# Patient Record
Sex: Male | Born: 1985 | Race: White | Hispanic: No | Marital: Single | Smoking: Current every day smoker
Health system: Southern US, Community
[De-identification: ages and names within clinical notes are randomized; demographics above are authoritative.]

## PROBLEM LIST (undated history)

## (undated) DIAGNOSIS — F419 Anxiety disorder, unspecified: Secondary | ICD-10-CM

## (undated) DIAGNOSIS — F329 Major depressive disorder, single episode, unspecified: Secondary | ICD-10-CM

## (undated) DIAGNOSIS — F32A Depression, unspecified: Secondary | ICD-10-CM

## (undated) DIAGNOSIS — G5600 Carpal tunnel syndrome, unspecified upper limb: Secondary | ICD-10-CM

## (undated) DIAGNOSIS — G8929 Other chronic pain: Secondary | ICD-10-CM

## (undated) DIAGNOSIS — G4700 Insomnia, unspecified: Secondary | ICD-10-CM

## (undated) DIAGNOSIS — M549 Dorsalgia, unspecified: Secondary | ICD-10-CM

## (undated) DIAGNOSIS — F4310 Post-traumatic stress disorder, unspecified: Secondary | ICD-10-CM

## (undated) DIAGNOSIS — T1491XA Suicide attempt, initial encounter: Secondary | ICD-10-CM

## (undated) DIAGNOSIS — F1910 Other psychoactive substance abuse, uncomplicated: Secondary | ICD-10-CM

## (undated) DIAGNOSIS — M25569 Pain in unspecified knee: Secondary | ICD-10-CM

---
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
## 2003-03-25 ENCOUNTER — Emergency Department (HOSPITAL_COMMUNITY): Admission: EM | Admit: 2003-03-25 | Discharge: 2003-03-25 | Payer: Self-pay | Admitting: Emergency Medicine

## 2004-05-31 ENCOUNTER — Emergency Department (HOSPITAL_COMMUNITY): Admission: EM | Admit: 2004-05-31 | Discharge: 2004-05-31 | Payer: Self-pay | Admitting: *Deleted

## 2004-08-31 ENCOUNTER — Ambulatory Visit: Payer: Self-pay | Admitting: Psychiatry

## 2004-08-31 ENCOUNTER — Inpatient Hospital Stay (HOSPITAL_COMMUNITY): Admission: RE | Admit: 2004-08-31 | Discharge: 2004-09-04 | Payer: Self-pay | Admitting: Psychiatry

## 2004-08-31 ENCOUNTER — Emergency Department (HOSPITAL_COMMUNITY): Admission: EM | Admit: 2004-08-31 | Discharge: 2004-08-31 | Payer: Self-pay | Admitting: Emergency Medicine

## 2006-10-28 ENCOUNTER — Emergency Department: Payer: Self-pay | Admitting: Emergency Medicine

## 2008-03-12 ENCOUNTER — Emergency Department (HOSPITAL_COMMUNITY): Admission: EM | Admit: 2008-03-12 | Discharge: 2008-03-13 | Payer: Self-pay | Admitting: Emergency Medicine

## 2008-06-25 ENCOUNTER — Emergency Department (HOSPITAL_COMMUNITY): Admission: EM | Admit: 2008-06-25 | Discharge: 2008-06-25 | Payer: Self-pay | Admitting: Emergency Medicine

## 2010-12-25 NOTE — Discharge Summary (Signed)
Ryan Kelley, SUMLER                  ACCOUNT NO.:  [id number]   MEDICAL RECORD NO.:  [id number]          PATIENT TYPE:  IPS   LOCATION:  0505                          FACILITY:  [facility]   PHYSICIAN:  Geoffery Lyons, M.D.      DATE OF BIRTH:  02/27/1986   DATE OF ADMISSION:  08/31/2004  DATE OF DISCHARGE:  09/04/2004                                 DISCHARGE SUMMARY   CHIEF COMPLAINT AND PRESENTING ILLNESS:  This was the first admission to  [facility]  for this 25 year old white male, single,  voluntarily admitted.  He presented in the emergency room complaining of  suicidal ideas since his brother died of an opiate overdose about 3 weeks  prior to this admission, endorsed depressed mood.   PAST PSYCHIATRIC HISTORY:  First time [facility], second inpatient,  last time when he was 14, after accidental overdose on Xanax.  Has been  treated with Lexapro and Zoloft.   ALCOHOL AND DRUG HISTORY:  Denies the use or abuse of any substances.   PAST MEDICAL HISTORY:  Cellulitis right arm.   MEDICATIONS:  Klonopin 2-3 mg per day, methadone 20-50 mg.   PHYSICAL EXAMINATION:  Performed, failed to show any acute findings.   LABORATORY WORKUP:  CBC within normal limits.  Blood chemistries were within  normal limits, glucose 109.  TSH 5.992, repeated to 2.133.  CBC:  Hemoglobin  14.5.  Drug screen positive for cocaine and benzodiazepines and marijuana.   MENTAL STATUS EXAM:  Reveals an alert, cooperative male, constricted affect,  some psychomotor retardation.  Speech soft tone, slow production.  Mood  depressed, affect constricted.  Thought processes logical, coherent and  relevant.  Endorsed suicide ideations, no plan, could contract for safety.  No homicidal ideas, no hallucinations, no delusions.  Cognition well  preserved.   ADMISSION DIAGNOSES:   AXIS I:  1.  Major depression, recurrent.  2.  Polysubstance abuse.   AXIS II:  No diagnosis.   AXIS III:  Cellulitis  right arm.   AXIS IV:  Moderate.   AXIS V:  Global assessment of function upon admission 20, highest global  assessment of function in past year 65.   COURSE IN HOSPITAL:  He was admitted and started on individual and group  psychotherapy.  He was given trazodone for sleep.  He was detoxed using  Clonidine, was given some Librium 25 every 6 hours as needed.  Also Seroquel  25 every 6 hours as needed, and he was placed on Symmetrel 100 twice a day.  He was treated for his cellulitis with Augmentin 875 mg every 8 hours which  was switched to Augmentin 875 every 12 hours x10 days.  He was able to  settle down and start opening up.  There was a family session with the  mother.  Mother did not feel that he was honest as he had called sister-in-  law to sneak in some pills.  He was minimizing his substance use.  His  mother overall did not feel that he was ready to leave the hospital.  He  said that he was doing well.  He was wanting to explore the possibility of a  halfway house, would go to AA/NA.  Endorsed that he needed to grieve the  death of his brother and he was willing to pursue further outpatient  treatment.  We worked at a relapse prevention plan, and on January 27 he was  in full contact with reality.  Endorsing no suicidal ideas, no homicidal  ideas, no hallucinations, no delusions.  He was insightful, committed to  abstinence, working on long-term recovery.   DISCHARGE DIAGNOSES:   AXIS I:  1.  Depressive disorder not otherwise specified.  2.  Polysubstance abuse.   AXIS II:  No diagnosis.   AXIS III:  Cellulitis.   AXIS IV:  Moderate.   AXIS V:  Global assessment of function upon discharge 50.   DISCHARGE MEDICATIONS:  1.  Symmetrel 100 twice a day.  2.  Augmentin 875 every 12 hours x7 days.  3.  Wellbutrin XL 150 mg daily.  4.  Trazodone 50 at night as needed for sleep.   DISPOSITION:  Follow up [facility]      IL/MEDQ  D:   10/06/2004  T:  10/06/2004  Job:  [id number]

## 2011-02-17 ENCOUNTER — Encounter: Payer: Self-pay | Admitting: *Deleted

## 2011-02-17 ENCOUNTER — Emergency Department (HOSPITAL_COMMUNITY)
Admission: EM | Admit: 2011-02-17 | Discharge: 2011-02-17 | Discharge status: Against Medical Advice | Payer: Self-pay | Attending: Emergency Medicine | Admitting: Emergency Medicine

## 2011-02-17 DIAGNOSIS — F1920 Other psychoactive substance dependence, uncomplicated: Secondary | ICD-10-CM | POA: Insufficient documentation

## 2011-02-17 DIAGNOSIS — E119 Type 2 diabetes mellitus without complications: Secondary | ICD-10-CM | POA: Insufficient documentation

## 2011-02-17 DIAGNOSIS — F1910 Other psychoactive substance abuse, uncomplicated: Secondary | ICD-10-CM

## 2011-02-17 HISTORY — DX: Post-traumatic stress disorder, unspecified: F43.10

## 2011-02-17 HISTORY — DX: Other psychoactive substance abuse, uncomplicated: F19.10

## 2011-02-17 LAB — BASIC METABOLIC PANEL
BUN: 12 mg/dL (ref 6–23)
CO2: 27 mEq/L (ref 19–32)
Chloride: 101 mEq/L (ref 96–112)
GFR calc Af Amer: >60 mL/min (ref >60)
Potassium: 4.5 mEq/L (ref 3.5–5.1)

## 2011-02-17 LAB — CBC
Hemoglobin: 16.6 g/dL (ref 13.0–17.0)
MCHC: 35.5 g/dL (ref 30.0–36.0)
RBC: 5.24 MIL/uL (ref 4.22–5.81)
WBC: 11.3 10*3/uL — ABNORMAL HIGH (ref 4.0–10.5)

## 2011-02-17 LAB — DIFFERENTIAL
Basophils Absolute: 0 10*3/uL (ref 0.0–0.1)
Basophils Relative: 0 % (ref 0–1)
Lymphocytes Relative: 33 % (ref 12–46)
Monocytes Relative: 7 % (ref 3–12)
Neutro Abs: 6.5 10*3/uL (ref 1.7–7.7)
Neutrophils Relative %: 58 % (ref 43–77)

## 2011-02-17 LAB — RAPID URINE DRUG SCREEN, HOSP PERFORMED
Amphetamines: NOT DETECTED
Benzodiazepines: POSITIVE — AB
Cocaine: NOT DETECTED

## 2011-02-17 LAB — ETHANOL: Alcohol, Ethyl (B): 33 mg/dL — ABNORMAL HIGH (ref 0–11)

## 2011-02-17 MED ORDER — LORAZEPAM 1 MG PO TABS: 2.0000 mg | ORAL_TABLET | Freq: Once | ORAL | Status: DC

## 2011-02-17 NOTE — ED Notes (Signed)
Pt wants help with his addiction to narcotics. Pt has been taking Percocet, Hydrocodone, Methadone, utracet, Morphine and in large amt. Pt states that he has been addicted since he was 10. Pt denies homicidal or suicidal ideations.

## 2011-02-17 NOTE — ED Provider Notes (Addendum)
History     Chief Complaint  Patient presents with  . Addiction Problem   Patient is a 25 y.o. male presenting with drug problem. The history is provided by the patient. No language interpreter was used.  Drug Problem This is a chronic problem. Episode onset: 14 years ago. The problem occurs constantly. The problem has not changed since onset.Pertinent negatives include no chest pain, no abdominal pain, no headaches and no shortness of breath. The symptoms are aggravated by nothing. Relieved by: suboxone. Treatments tried: Suboxone. The treatment provided moderate relief.  C/o narcotic addiction and states he is currently going through withdrawal. Patient expresses interest in rehab program for narcotic addiction. Patient reports he has been taking Percocet, Hydrocodone, Methadone, Oxycodone. Morphine in large amounts. States he last used narcotics 2 days ago. C/o current myalgias, chills, diarrhea. States symptoms only relieved when taking Suboxone. Patient reports he has been addicted to narcotics since he was 25 yo. H/o diabetes and PTSD.  Past Medical History  Diagnosis Date  . Substance abuse   . Diabetes mellitus   . PTSD (post-traumatic stress disorder)     History reviewed. No pertinent past surgical history.  Family History  Problem Relation Age of Onset  . Diabetes Mother   . Cancer Mother   . Hypertension Mother   . Hypertension Father     History  Substance Use Topics  . Smoking status: Current Everyday Smoker -- 1.0 packs/day for 7 years    Types: Cigarettes  . Smokeless tobacco: Not on file  . Alcohol Use: 6.9 oz/week    1 Glasses of wine, 6 Cans of beer, 2 Shots of liquor, 3 Drinks containing 0.5 oz of alcohol per week      Review of Systems  Constitutional: Positive for chills.  Respiratory: Negative for shortness of breath.   Cardiovascular: Negative for chest pain.  Gastrointestinal: Positive for diarrhea. Negative for nausea, vomiting and abdominal pain.   Musculoskeletal: Positive for myalgias.  Neurological: Negative for headaches.  Psychiatric/Behavioral: Negative for suicidal ideas.       Negative Homicidal Ideations. Positive drug abuse.   All other systems reviewed and are negative.   Physical Exam  BP 124/67  Pulse 78  Temp(Src) 98.8 F (37.1 C) (Oral)  Resp 20  Ht 5\' 5"  (1.651 m)  Wt 200 lb (90.719 kg)  BMI 33.28 kg/m2  SpO2 99%  Physical Exam  Nursing note and vitals reviewed. Constitutional: He is oriented to person, place, and time. Vital signs are normal. He appears well-developed and well-nourished.  HENT:  Head: Normocephalic and atraumatic.  Eyes: Conjunctivae are normal. Pupils are equal, round, and reactive to light.  Neck: Neck supple.  Cardiovascular: Normal rate, regular rhythm, normal heart sounds and normal pulses.   Pulmonary/Chest: Effort normal and breath sounds normal.  Abdominal: Soft. Bowel sounds are normal. He exhibits no distension.  Musculoskeletal: Normal range of motion. He exhibits no edema.  Neurological: He is alert and oriented to person, place, and time. No sensory deficit.  Skin: Skin is warm and dry.  Psychiatric: He has a normal mood and affect. His behavior is normal.    ED Course  Procedures   BHS consult I personally performed the services described in this documentation, which was scribed in my presence. The recorded information has been reviewed and considered.   Donnetta Hutching, MD 02/17/11 1450  Donnetta Hutching, MD 02/17/11 1451  Donnetta Hutching, MD 02/17/11 1451

## 2011-02-17 NOTE — ED Notes (Signed)
Tommy, ACT team member, here to assess.  Patient lying on stretcher talking on cell phone.

## 2011-02-17 NOTE — ED Notes (Signed)
No change in status. NAD.  Awaiting ACT member to evaluate.

## 2011-03-08 ENCOUNTER — Emergency Department (HOSPITAL_COMMUNITY)
Admission: EM | Admit: 2011-03-08 | Discharge: 2011-03-08 | Disposition: A | Discharge status: Rehabilitation Facility | Payer: Self-pay | Attending: Emergency Medicine | Admitting: Emergency Medicine

## 2011-03-08 ENCOUNTER — Encounter (HOSPITAL_COMMUNITY): Payer: Self-pay | Admitting: Emergency Medicine

## 2011-03-08 DIAGNOSIS — F1110 Opioid abuse, uncomplicated: Secondary | ICD-10-CM

## 2011-03-08 DIAGNOSIS — J3489 Other specified disorders of nose and nasal sinuses: Secondary | ICD-10-CM | POA: Insufficient documentation

## 2011-03-08 DIAGNOSIS — IMO0001 Reserved for inherently not codable concepts without codable children: Secondary | ICD-10-CM | POA: Insufficient documentation

## 2011-03-08 DIAGNOSIS — R197 Diarrhea, unspecified: Secondary | ICD-10-CM | POA: Insufficient documentation

## 2011-03-08 DIAGNOSIS — R6883 Chills (without fever): Secondary | ICD-10-CM | POA: Insufficient documentation

## 2011-03-08 DIAGNOSIS — F17200 Nicotine dependence, unspecified, uncomplicated: Secondary | ICD-10-CM | POA: Insufficient documentation

## 2011-03-08 DIAGNOSIS — F1120 Opioid dependence, uncomplicated: Secondary | ICD-10-CM | POA: Insufficient documentation

## 2011-03-08 LAB — COMPREHENSIVE METABOLIC PANEL
Albumin: 4.3 g/dL (ref 3.5–5.2)
BUN: 11 mg/dL (ref 6–23)
Calcium: 10.1 mg/dL (ref 8.4–10.5)
Creatinine, Ser: 0.93 mg/dL (ref 0.50–1.35)
Total Protein: 8.1 g/dL (ref 6.0–8.3)

## 2011-03-08 LAB — RAPID URINE DRUG SCREEN, HOSP PERFORMED
Amphetamines: NOT DETECTED
Benzodiazepines: POSITIVE — AB
Cocaine: NOT DETECTED
Opiates: NOT DETECTED

## 2011-03-08 LAB — CBC
HCT: 45.7 % (ref 39.0–52.0)
MCHC: 35.2 g/dL (ref 30.0–36.0)
MCV: 90.9 fL (ref 78.0–100.0)
RDW: 12.8 % (ref 11.5–15.5)

## 2011-03-08 LAB — ETHANOL: Alcohol, Ethyl (B): <11 mg/dL (ref 0–11)

## 2011-03-08 MED ORDER — CLONIDINE HCL 0.1 MG PO TABS
0.1000 mg | ORAL_TABLET | Freq: Once | ORAL | Status: AC
  Administered 2011-03-08: 0.1 mg via ORAL
  Filled 2011-03-08: qty 1

## 2011-03-08 NOTE — ED Provider Notes (Addendum)
History   Written by Enos Fling acting as scribe for Felisa Bonier, MD.   Chief Complaint  Patient presents with  . Addiction Problem   The history is provided by the patient.  25 yo male presents requesting medical clearance and referral to an inpatient detox program. Pt reports he abuses po narcotics and also uses marijuana; reports last use 2 days ago and is c/o withdrawal sx of body aches, chills, runny nose, and diarrhea. Pt states he has been in rehab previously but not for narcotics. Also reports he has an rx for diazepam and does not abuse this med. Denies cp, sob, ha, abd pain, or n/v.   Past Medical History  Diagnosis Date  . Substance abuse   . Diabetes mellitus   . PTSD (post-traumatic stress disorder)     History reviewed. No pertinent past surgical history.  Family History  Problem Relation Age of Onset  . Diabetes Mother   . Cancer Mother   . Hypertension Mother   . Hypertension Father     History  Substance Use Topics  . Smoking status: Current Everyday Smoker -- 1.0 packs/day for 7 years    Types: Cigarettes  . Smokeless tobacco: Not on file  . Alcohol Use: 0.0 oz/week     occasional      Review of Systems  Constitutional: Positive for chills. Negative for fever.  HENT: Positive for rhinorrhea. Negative for congestion.   Respiratory: Negative for shortness of breath.   Cardiovascular: Negative for chest pain.  Gastrointestinal: Positive for diarrhea. Negative for nausea and vomiting.  Musculoskeletal: Positive for myalgias.  Skin: Negative for pallor.  Neurological: Negative for dizziness.  Psychiatric/Behavioral: Negative for suicidal ideas, hallucinations and self-injury.  All other systems reviewed and are negative.    Physical Exam  BP 122/66  Pulse 72  Temp(Src) 99 F (37.2 C) (Oral)  Resp 20  Ht 5\' 5"  (1.651 m)  Wt 200 lb (90.719 kg)  BMI 33.28 kg/m2  SpO2 98%  Physical Exam  Nursing note and vitals  reviewed. Constitutional: He is oriented to person, place, and time. He appears well-developed and well-nourished. No distress.  HENT:  Head: Normocephalic and atraumatic.  Eyes: EOM are normal.       pupils equal but dilated 5 mm  Neck: Normal range of motion. Neck supple. No JVD present.  Cardiovascular: Normal rate, regular rhythm and normal heart sounds.   Pulmonary/Chest: Effort normal.  Abdominal: Soft. Bowel sounds are normal. There is no tenderness.  Musculoskeletal: He exhibits no edema and no tenderness.       No swelling or deformity of extremities   Neurological: He is alert and oriented to person, place, and time.  Skin: Skin is warm and dry. No rash noted. No erythema.  Psychiatric: He has a normal mood and affect.    ED Course  Procedures  OTHER DATA REVIEWED: Nursing notes, vital signs, and past medical records reviewed.   LABS / RADIOLOGY: Results for orders placed during the hospital encounter of 03/08/11  CBC      Component Value Range   WBC 9.0  4.0 - 10.5 (K/uL)   RBC 5.03  4.22 - 5.81 (MIL/uL)   Hemoglobin 16.1  13.0 - 17.0 (g/dL)   HCT 78.4  69.6 - 29.5 (%)   MCV 90.9  78.0 - 100.0 (fL)   MCH 32.0  26.0 - 34.0 (pg)   MCHC 35.2  30.0 - 36.0 (g/dL)   RDW 28.4  13.2 -  15.5 (%)   Platelets 278  150 - 400 (K/uL)  COMPREHENSIVE METABOLIC PANEL      Component Value Range   Sodium 139  135 - 145 (mEq/L)   Potassium 4.1  3.5 - 5.1 (mEq/L)   Chloride 103  96 - 112 (mEq/L)   CO2 27  19 - 32 (mEq/L)   Glucose, Bld 106 (*) 70 - 99 (mg/dL)   BUN 11  6 - 23 (mg/dL)   Creatinine, Ser 1.61  0.50 - 1.35 (mg/dL)   Calcium 09.6  8.4 - 10.5 (mg/dL)   Total Protein 8.1  6.0 - 8.3 (g/dL)   Albumin 4.3  3.5 - 5.2 (g/dL)   AST 23  0 - 37 (U/L)   ALT 34  0 - 53 (U/L)   Alkaline Phosphatase 79  39 - 117 (U/L)   Total Bilirubin 0.3  0.3 - 1.2 (mg/dL)   GFR calc non Af Amer >60  >60 (mL/min)   GFR calc Af Amer >60  >60 (mL/min)  ETHANOL      Component Value Range    Alcohol, Ethyl (B) <11  0 - 11 (mg/dL)  URINE RAPID DRUG SCREEN (HOSP PERFORMED)      Component Value Range   Opiates NONE DETECTED  NONE DETECTED    Cocaine NONE DETECTED  NONE DETECTED    Benzodiazepines POSITIVE (*) NONE DETECTED    Amphetamines NONE DETECTED  NONE DETECTED    Tetrahydrocannabinol POSITIVE (*) NONE DETECTED    Barbiturates NONE DETECTED  NONE DETECTED    Radiology: No results found.   ED COURSE / COORDINATION OF CARE:   MDM: The patient appears to have dependence issue with prescription narcotics, and otherwise is of no threat to himself or others. He is awake alert and oriented appropriately, behaving normally. At this time he has been accepted to [facility] substance abuse treatment facility and program, and I will discharge him to go directly to [facility] for substance abuse treatment. Patient states his understanding of and agreement with this plan of care.     Felisa Bonier, MD 03/08/11 2017  I personally performed the services described in this documentation, which was scribed in my presence. The recorded information has been reviewed and considered.   Felisa Bonier, MD 06/18/11 1440

## 2011-03-08 NOTE — ED Notes (Signed)
Ryan Kelley, ACT team, in to speak with patient.

## 2011-03-08 NOTE — ED Notes (Signed)
Pt here for medical clearance to get help with addition. Pt states he is addicted to po narcotics, last use 03/06/11. Pt states he also smokes marijuana. Denies si/hi.

## 2011-05-07 LAB — POCT I-STAT, CHEM 8
Glucose, Bld: 320 — ABNORMAL HIGH
HCT: 46
Hemoglobin: 15.6
Potassium: 4.2
Sodium: 135

## 2011-05-07 LAB — RAPID URINE DRUG SCREEN, HOSP PERFORMED
Amphetamines: NOT DETECTED
Barbiturates: NOT DETECTED
Benzodiazepines: POSITIVE — AB

## 2012-10-17 ENCOUNTER — Encounter (HOSPITAL_COMMUNITY): Payer: Self-pay | Admitting: *Deleted

## 2012-10-17 ENCOUNTER — Emergency Department (HOSPITAL_COMMUNITY)
Admission: EM | Admit: 2012-10-17 | Discharge: 2012-10-18 | Disposition: A | Discharge status: Other Acute Inpatient Hospital | Payer: Self-pay | Attending: Emergency Medicine | Admitting: Emergency Medicine

## 2012-10-17 DIAGNOSIS — F3289 Other specified depressive episodes: Secondary | ICD-10-CM | POA: Insufficient documentation

## 2012-10-17 DIAGNOSIS — R197 Diarrhea, unspecified: Secondary | ICD-10-CM | POA: Insufficient documentation

## 2012-10-17 DIAGNOSIS — R45851 Suicidal ideations: Secondary | ICD-10-CM

## 2012-10-17 DIAGNOSIS — F32A Depression, unspecified: Secondary | ICD-10-CM

## 2012-10-17 DIAGNOSIS — F1210 Cannabis abuse, uncomplicated: Secondary | ICD-10-CM | POA: Insufficient documentation

## 2012-10-17 DIAGNOSIS — Z8659 Personal history of other mental and behavioral disorders: Secondary | ICD-10-CM | POA: Insufficient documentation

## 2012-10-17 DIAGNOSIS — E119 Type 2 diabetes mellitus without complications: Secondary | ICD-10-CM | POA: Insufficient documentation

## 2012-10-17 DIAGNOSIS — R110 Nausea: Secondary | ICD-10-CM | POA: Insufficient documentation

## 2012-10-17 DIAGNOSIS — F17200 Nicotine dependence, unspecified, uncomplicated: Secondary | ICD-10-CM | POA: Insufficient documentation

## 2012-10-17 HISTORY — DX: Suicide attempt, initial encounter: T14.91XA

## 2012-10-17 LAB — CBC WITH DIFFERENTIAL/PLATELET
Basophils Absolute: 0 10*3/uL (ref 0.0–0.1)
Basophils Relative: 0 % (ref 0–1)
Hemoglobin: 16.2 g/dL (ref 13.0–17.0)
MCHC: 35.1 g/dL (ref 30.0–36.0)
Monocytes Relative: 4 % (ref 3–12)
Neutro Abs: 7 10*3/uL (ref 1.7–7.7)
Neutrophils Relative %: 67 % (ref 43–77)

## 2012-10-17 LAB — COMPREHENSIVE METABOLIC PANEL
ALT: 21 U/L (ref 0–53)
AST: 18 U/L (ref 0–37)
Albumin: 4.5 g/dL (ref 3.5–5.2)
Alkaline Phosphatase: 71 U/L (ref 39–117)
Chloride: 101 mEq/L (ref 96–112)
Potassium: 4.1 mEq/L (ref 3.5–5.1)
Sodium: 140 mEq/L (ref 135–145)
Total Bilirubin: 0.3 mg/dL (ref 0.3–1.2)

## 2012-10-17 LAB — RAPID URINE DRUG SCREEN, HOSP PERFORMED
Amphetamines: NOT DETECTED
Tetrahydrocannabinol: POSITIVE — AB

## 2012-10-17 MED ORDER — ONDANSETRON HCL 4 MG PO TABS: 4.0000 mg | ORAL_TABLET | Freq: Three times a day (TID) | ORAL | Status: DC | PRN

## 2012-10-17 MED ORDER — LORAZEPAM 1 MG PO TABS
1.0000 mg | ORAL_TABLET | Freq: Three times a day (TID) | ORAL | Status: DC | PRN
  Administered 2012-10-17: 1 mg via ORAL
  Filled 2012-10-17: qty 1

## 2012-10-17 MED ORDER — IBUPROFEN 400 MG PO TABS
600.0000 mg | ORAL_TABLET | Freq: Three times a day (TID) | ORAL | Status: DC | PRN
  Administered 2012-10-17: 600 mg via ORAL
  Filled 2012-10-17: qty 2

## 2012-10-17 MED ORDER — NICOTINE 21 MG/24HR TD PT24
21.0000 mg | MEDICATED_PATCH | Freq: Every day | TRANSDERMAL | Status: DC
  Administered 2012-10-17: 21 mg via TRANSDERMAL
  Filled 2012-10-17: qty 1

## 2012-10-17 MED ORDER — ACETAMINOPHEN 325 MG PO TABS: 650.0000 mg | ORAL_TABLET | ORAL | Status: DC | PRN

## 2012-10-17 MED ORDER — ALUM & MAG HYDROXIDE-SIMETH 200-200-20 MG/5ML PO SUSP: 30.0000 mL | ORAL | Status: DC | PRN

## 2012-10-17 MED ORDER — ZOLPIDEM TARTRATE 5 MG PO TABS
10.0000 mg | ORAL_TABLET | Freq: Every evening | ORAL | Status: DC | PRN
  Administered 2012-10-17: 10 mg via ORAL
  Filled 2012-10-17: qty 2

## 2012-10-17 NOTE — ED Provider Notes (Signed)
History  This chart was scribed for Ward Givens, MD by Bennett Scrape, ED Scribe. This patient was seen in room APA17/APA17 and the patient's care was started at 1:04 PM.  CSN: [id number]  Arrival date & time 10/17/12  1228   First MD Initiated Contact with Patient 10/17/12 1304      Chief Complaint  Patient presents with  . V70.1     The history is provided by the patient. A language interpreter was used.    Ryan Kelley is a 27 y.o. male who presents to the Emergency Department complaining of one month of constant SI with one SI attempt of taking a "small handful" of Advil  4 days ago. Mother reports that the pt called her today and stated that he was feeling suicidal and need to "get some help" which is why she brought him in for evaluation today. Pt denies having emesis, melena and dizziness afterwards but reports periumbilical abdominal pain, nausea and diarrhea. He reports prior SI attempts after overdosing on Xanax and was hospitalized when 27 yo. He reports that he was being followed up by Dr. Tiburcio Pea with [facility] but stopped going over one year ago when she left the practice because "she was the best doctor there". He denies seeing a therapist and denies being on any psychiatric medications currently. He reports that he used to be on Valium and cymbala but has not been medicated since he quit seeing Dr. Tiburcio Pea over one year ago.  He reports that he drinks a 6 pack one a week and smokes marijuana. He denies any other illegal drug use.   PCP Dr Ouida Sills  Past Medical History  Diagnosis Date  . Substance abuse   . PTSD (post-traumatic stress disorder)   . Attempted suicide   . Diabetes mellitus     History reviewed. No pertinent past surgical history.  Family History  Problem Relation Age of Onset  . Diabetes Mother   . Cancer Mother   . Hypertension Mother   . Hypertension Father     History  Substance Use Topics  . Smoking status: Current Every Day Smoker -- 1.00  packs/day for 7 years    Types: Cigarettes  . Smokeless tobacco: Not on file  . Alcohol Use: 0.0 oz/week     Comment: once a week, occasional use  Lives with significant other Unemployed     Review of Systems  Gastrointestinal: Positive for nausea, abdominal pain and diarrhea. Negative for vomiting.  Neurological: Negative for dizziness.  Psychiatric/Behavioral: Positive for suicidal ideas.  All other systems reviewed and are negative.    Allergies  Review of patient's allergies indicates no known allergies.  Home Medications   Current Outpatient Rx  Name  Route  Sig  Dispense  Refill  . naproxen sodium (ANAPROX) 220 MG tablet   Oral   Take 440 mg by mouth 2 (two) times daily with a meal.           Triage Vitals: BP 135/72  Pulse 106  Temp(Src) 98.1 F (36.7 C) (Oral)  Resp 18  Ht 5\' 5"  (1.651 m)  Wt 195 lb (88.451 kg)  BMI 32.45 kg/m2  SpO2 98%  Vital signs normal    Physical Exam  Nursing note and vitals reviewed. Constitutional: He is oriented to person, place, and time. He appears well-developed and well-nourished.  Non-toxic appearance. He does not appear ill. No distress.  HENT:  Head: Normocephalic and atraumatic.  Right Ear: External ear normal.  Left Ear: External ear normal.  Nose: Nose normal. No mucosal edema or rhinorrhea.  Mouth/Throat: Oropharynx is clear and moist and mucous membranes are normal. No dental abscesses or edematous.  Eyes: Conjunctivae and EOM are normal. Pupils are equal, round, and reactive to light.  Neck: Normal range of motion and full passive range of motion without pain. Neck supple.  Cardiovascular: Normal rate, regular rhythm and normal heart sounds.  Exam reveals no gallop and no friction rub.   No murmur heard. Pulmonary/Chest: Effort normal and breath sounds normal. No respiratory distress. He has no wheezes. He has no rhonchi. He has no rales. He exhibits no tenderness and no crepitus.  Abdominal: Soft. Normal  appearance and bowel sounds are normal. He exhibits no distension. There is no tenderness. There is no rebound and no guarding.  Musculoskeletal: Normal range of motion. He exhibits no edema and no tenderness.  Moves all extremities well.   Neurological: He is alert and oriented to person, place, and time. He has normal strength. No cranial nerve deficit.  Skin: Skin is warm, dry and intact. No rash noted. No erythema. No pallor.  Psychiatric: His speech is normal and behavior is normal. His mood appears not anxious.  Affect flat    ED Course  Procedures (including critical care time)  DIAGNOSTIC STUDIES: Oxygen Saturation is 98% on room air, normal by my interpretation.    COORDINATION OF CARE: 1:35 PM-Discussed treatment plan which includes CBC panel, UA and ACT evaluation with pt at bedside and pt agreed to plan.   Tommy here in ED and has evaluated patient.   Results for orders placed during the hospital encounter of 10/17/12  CBC WITH DIFFERENTIAL      Result Value Range   WBC 10.4  4.0 - 10.5 K/uL   RBC 5.16  4.22 - 5.81 MIL/uL   Hemoglobin 16.2  13.0 - 17.0 g/dL   HCT 63.8  75.6 - 43.3 %   MCV 89.3  78.0 - 100.0 fL   MCH 31.4  26.0 - 34.0 pg   MCHC 35.1  30.0 - 36.0 g/dL   RDW 29.5  18.8 - 41.6 %   Platelets 286  150 - 400 K/uL   Neutrophils Relative 67  43 - 77 %   Neutro Abs 7.0  1.7 - 7.7 K/uL   Lymphocytes Relative 27  12 - 46 %   Lymphs Abs 2.8  0.7 - 4.0 K/uL   Monocytes Relative 4  3 - 12 %   Monocytes Absolute 0.5  0.1 - 1.0 K/uL   Eosinophils Relative 1  0 - 5 %   Eosinophils Absolute 0.1  0.0 - 0.7 K/uL   Basophils Relative 0  0 - 1 %   Basophils Absolute 0.0  0.0 - 0.1 K/uL  COMPREHENSIVE METABOLIC PANEL      Result Value Range   Sodium 140  135 - 145 mEq/L   Potassium 4.1  3.5 - 5.1 mEq/L   Chloride 101  96 - 112 mEq/L   CO2 28  19 - 32 mEq/L   Glucose, Bld 105 (*) 70 - 99 mg/dL   BUN 17  6 - 23 mg/dL   Creatinine, Ser 6.06  0.50 - 1.35 mg/dL    Calcium 9.8  8.4 - 30.1 mg/dL   Total Protein 7.3  6.0 - 8.3 g/dL   Albumin 4.5  3.5 - 5.2 g/dL   AST 18  0 - 37 U/L   ALT 21  0 - 53 U/L   Alkaline Phosphatase 71  39 - 117 U/L   Total Bilirubin 0.3  0.3 - 1.2 mg/dL   GFR calc non Af Amer >90  >90 mL/min   GFR calc Af Amer >90  >90 mL/min  ETHANOL      Result Value Range   Alcohol, Ethyl (B) <11  0 - 11 mg/dL   Laboratory interpretation all normal     1. Depression   2. Suicidal ideation    Plan inpatient psychiatric admission   Devoria Albe, MD, FACEP   MDM   I personally performed the services described in this documentation, which was scribed in my presence. The recorded information has been reviewed and considered.  Devoria Albe, MD, Armando Gang       Ward Givens, MD 10/17/12 [phone]

## 2012-10-17 NOTE — ED Notes (Signed)
Advised no more phone calls for the night.  Will be in to check vital signs and he is to go to sleep.

## 2012-10-17 NOTE — BH Assessment (Signed)
Assessment Note   Ryan Kelley is an 27 y.o. male. There patient presented to the ED with complaints of increased depression and current suicidal ideations. He was seen by Dr Carroll Sage until she closed her practice in [place] about  A year ago. He has had no follow up and now has no medication. Four days ago he tried to overdose on a handful of Aleve. This only made him sick at his stomach. He continues to have suicidal thoughts with plan to overdose again. He is not homicidal; and has no history of violencehe denies . He is not psychotic. He lives with his girlfriend and is unemployed. He denies substance abuse. He does admit to only accessional use of marijuana. He feels worthless, he is crying at times, he has sleep disturbance, and no energy. He cannot contract for safety.   Axis I: Major Depression, Recurrent severe Axis II: Deferred Axis III:  Past Medical History  Diagnosis Date  . Substance abuse   . PTSD (post-traumatic stress disorder)   . Attempted suicide   . Diabetes mellitus    Axis IV: economic problems, occupational problems and problems with access to health care services Axis V: 31-40 impairment in reality testing  Past Medical History:  Past Medical History  Diagnosis Date  . Substance abuse   . PTSD (post-traumatic stress disorder)   . Attempted suicide   . Diabetes mellitus     History reviewed. No pertinent past surgical history.  Family History:  Family History  Problem Relation Age of Onset  . Diabetes Mother   . Cancer Mother   . Hypertension Mother   . Hypertension Father     Social History:  reports that he has been smoking Cigarettes.  He has a 7 pack-year smoking history. He does not have any smokeless tobacco history on file. He reports that  drinks alcohol. He reports that he uses illicit drugs (Marijuana) about 5 times per week.  Additional Social History:     CIWA: CIWA-Ar BP: 115/54 mmHg Pulse Rate: 84 COWS:    Allergies: No Known  Allergies  Home Medications:  (Not in a hospital admission)  OB/GYN Status:  No LMP for male patient.  General Assessment Data Location of Assessment: [facility] ACT Assessment: Yes Living Arrangements: Spouse/significant other Can pt return to current living arrangement?: Yes Admission Status: Voluntary Is patient capable of signing voluntary admission?: Yes Transfer from: Acute Hospital Referral Source: MD  Education Status Is patient currently in school?: No  Risk to self Suicidal Ideation: Yes-Currently Present Suicidal Intent: Yes-Currently Present Is patient at risk for suicide?: Yes Suicidal Plan?: No-Not Currently/Within Last 6 Months Access to Means: No What has been your use of drugs/alcohol within the last 12 months?: denies (only occassional use of marijuana) Previous Attempts/Gestures: Yes (4 days ago) How many times?: 1 Other Self Harm Risks: denies Triggers for Past Attempts: Other (Comment) (ongoing depressed mood) Intentional Self Injurious Behavior: None Family Suicide History: No Recent stressful life event(s): Financial Problems Persecutory voices/beliefs?: No Depression: Yes Depression Symptoms: Insomnia;Tearfulness;Fatigue;Loss of interest in usual pleasures;Feeling worthless/self pity Substance abuse history and/or treatment for substance abuse?: Yes (occassional use of marijuana) Suicide prevention information given to non-admitted patients: Not applicable  Risk to Others Homicidal Ideation: No Thoughts of Harm to Others: No Current Homicidal Intent: No Current Homicidal Plan: No Access to Homicidal Means: No History of harm to others?: No Assessment of Violence: None Noted Does patient have access to weapons?: No Criminal Charges Pending?:  No Does patient have a court date: No  Psychosis Hallucinations: None noted Delusions: None noted  Mental Status Report Appear/Hygiene: Improved Eye Contact: Good Motor Activity: Freedom of  movement Speech: Logical/coherent Level of Consciousness: Alert Mood: Depressed Affect: Blunted Anxiety Level: None Thought Processes: Coherent;Relevant Judgement: Unimpaired Orientation: Person;Place;Time Obsessive Compulsive Thoughts/Behaviors: None  Cognitive Functioning Concentration: Normal Memory: Recent Intact;Remote Intact IQ: Average Insight: Fair Impulse Control: Fair Appetite: Good Weight Loss: 20 (several months) Weight Gain: 0 Sleep: Decreased Total Hours of Sleep: 3 Vegetative Symptoms: None  ADLScreening [facility] Assessment Services) Patient's cognitive ability adequate to safely complete daily activities?: Yes Patient able to express need for assistance with ADLs?: Yes Independently performs ADLs?: Yes (appropriate for developmental age)  Abuse/Neglect [facility]) Physical Abuse: Denies Verbal Abuse: Denies Sexual Abuse: Denies  Prior Inpatient Therapy Prior Inpatient Therapy: No  Prior Outpatient Therapy Prior Outpatient Therapy: Yes Prior Therapy Dates: 2012-2013 Prior Therapy Facilty/Provider(s): dr Steward Drone Harris/ [facility]  (not seen oveer a year, no current medicatiosn) Reason for Treatment: medications  ADL Screening (condition at time of admission) Patient's cognitive ability adequate to safely complete daily activities?: Yes Patient able to express need for assistance with ADLs?: Yes Independently performs ADLs?: Yes (appropriate for developmental age)       Abuse/Neglect Assessment (Assessment to be complete while patient is alone) Physical Abuse: Denies Verbal Abuse: Denies Sexual Abuse: Denies Values / Beliefs Cultural Requests During Hospitalization: None Spiritual Requests During Hospitalization: None        Additional Information 1:1 In Past 12 Months?: No CIRT Risk: No Elopement Risk: No Does patient have medical clearance?: Yes     Disposition: DR KNAPP IS IN AGREEMENT WITH REFERRING PATIENT TO INPATIENT AT [facility]  [facility] Disposition Initial Assessment Completed: Yes Disposition of Patient: Inpatient treatment program Type of inpatient treatment program: Adult  On Site Evaluation by:   Reviewed with Physician:     Jearld Pies 10/17/2012 3:12 PM

## 2012-10-17 NOTE — ED Notes (Signed)
Pt states with suicidal thoughts, attempted last week by taking large amount of aleve, denies seeing things or hearing voices, denies plan at this time

## 2012-10-18 ENCOUNTER — Encounter (HOSPITAL_COMMUNITY): Payer: Self-pay | Admitting: *Deleted

## 2012-10-18 ENCOUNTER — Inpatient Hospital Stay (HOSPITAL_COMMUNITY)
Admission: AD | Admit: 2012-10-18 | Discharge: 2012-10-21 | DRG: 897 | Disposition: A | Discharge status: Home / Self Care | Payer: Federal, State, Local not specified - Other | Source: Ambulatory Visit | Attending: Psychiatry | Admitting: Psychiatry

## 2012-10-18 ENCOUNTER — Encounter (HOSPITAL_COMMUNITY): Payer: Self-pay

## 2012-10-18 DIAGNOSIS — F4310 Post-traumatic stress disorder, unspecified: Secondary | ICD-10-CM

## 2012-10-18 DIAGNOSIS — F1120 Opioid dependence, uncomplicated: Secondary | ICD-10-CM

## 2012-10-18 DIAGNOSIS — F19939 Other psychoactive substance use, unspecified with withdrawal, unspecified: Principal | ICD-10-CM | POA: Diagnosis present

## 2012-10-18 DIAGNOSIS — Z79899 Other long term (current) drug therapy: Secondary | ICD-10-CM

## 2012-10-18 DIAGNOSIS — F1110 Opioid abuse, uncomplicated: Secondary | ICD-10-CM | POA: Diagnosis present

## 2012-10-18 DIAGNOSIS — F1193 Opioid use, unspecified with withdrawal: Secondary | ICD-10-CM

## 2012-10-18 MED ORDER — ALUM & MAG HYDROXIDE-SIMETH 200-200-20 MG/5ML PO SUSP: 30.0000 mL | ORAL | Status: DC | PRN

## 2012-10-18 MED ORDER — NAPROXEN 500 MG PO TABS
500.0000 mg | ORAL_TABLET | Freq: Two times a day (BID) | ORAL | Status: DC | PRN
  Administered 2012-10-18: 500 mg via ORAL
  Filled 2012-10-18: qty 1

## 2012-10-18 MED ORDER — LOPERAMIDE HCL 2 MG PO CAPS
2.0000 mg | ORAL_CAPSULE | ORAL | Status: DC | PRN
  Administered 2012-10-18: 4 mg via ORAL

## 2012-10-18 MED ORDER — ONDANSETRON 4 MG PO TBDP: 4.0000 mg | ORAL_TABLET | Freq: Four times a day (QID) | ORAL | Status: DC | PRN

## 2012-10-18 MED ORDER — ACETAMINOPHEN 325 MG PO TABS
650.0000 mg | ORAL_TABLET | Freq: Four times a day (QID) | ORAL | Status: DC | PRN
Start: 2012-10-18 — End: 2012-10-21

## 2012-10-18 MED ORDER — NICOTINE 21 MG/24HR TD PT24
21.0000 mg | MEDICATED_PATCH | Freq: Every day | TRANSDERMAL | Status: DC
  Administered 2012-10-18: 21 mg via TRANSDERMAL
  Filled 2012-10-18 (×3): qty 1

## 2012-10-18 MED ORDER — TRAZODONE HCL 50 MG PO TABS
50.0000 mg | ORAL_TABLET | Freq: Every evening | ORAL | Status: DC | PRN
  Administered 2012-10-18 – 2012-10-20 (×3): 50 mg via ORAL
  Filled 2012-10-18: qty 1
  Filled 2012-10-18: qty 14
  Filled 2012-10-18 (×2): qty 1

## 2012-10-18 MED ORDER — METHOCARBAMOL 500 MG PO TABS
500.0000 mg | ORAL_TABLET | Freq: Three times a day (TID) | ORAL | Status: DC | PRN
  Administered 2012-10-18 – 2012-10-19 (×2): 500 mg via ORAL
  Filled 2012-10-18 (×2): qty 1

## 2012-10-18 MED ORDER — CLONIDINE HCL 0.1 MG PO TABS
0.1000 mg | ORAL_TABLET | Freq: Four times a day (QID) | ORAL | Status: AC
  Administered 2012-10-18 – 2012-10-19 (×7): 0.1 mg via ORAL
  Filled 2012-10-18 (×7): qty 1

## 2012-10-18 MED ORDER — HYDROXYZINE HCL 25 MG PO TABS: 25.0000 mg | ORAL_TABLET | Freq: Four times a day (QID) | ORAL | Status: DC | PRN

## 2012-10-18 MED ORDER — CLONIDINE HCL 0.1 MG PO TABS
0.1000 mg | ORAL_TABLET | ORAL | Status: DC
  Administered 2012-10-20 – 2012-10-21 (×3): 0.1 mg via ORAL
  Filled 2012-10-18 (×4): qty 1

## 2012-10-18 MED ORDER — DICYCLOMINE HCL 20 MG PO TABS: 20.0000 mg | ORAL_TABLET | Freq: Four times a day (QID) | ORAL | Status: DC | PRN

## 2012-10-18 MED ORDER — MAGNESIUM HYDROXIDE 400 MG/5ML PO SUSP: 30.0000 mL | Freq: Every day | ORAL | Status: DC | PRN

## 2012-10-18 MED ORDER — CLONIDINE HCL 0.1 MG PO TABS
0.1000 mg | ORAL_TABLET | Freq: Every day | ORAL | Status: DC
  Filled 2012-10-18 (×2): qty 1

## 2012-10-18 NOTE — ED Notes (Signed)
Pt w/ snoring resp. Normal rise and fall of chest noted, sitter at bedside.

## 2012-10-18 NOTE — [facility] Suicide Risk Assessment (Signed)
Suicide Risk Assessment  Admission Assessment     Nursing information obtained from:  Patient Demographic factors:  Male;Low socioeconomic status Current Mental Status:  Suicidal ideation indicated by patient;Intention to act on suicide plan Loss Factors:  Decrease in vocational status;Financial problems / change in socioeconomic status Historical Factors:  Family history of mental illness or substance abuse Risk Reduction Factors:  Living with another person, especially a relative;Positive social support  CLINICAL FACTORS:   Panic Attacks Depression:   Comorbid alcohol abuse/dependence Hopelessness Insomnia Alcohol/Substance Abuse/Dependencies  COGNITIVE FEATURES THAT CONTRIBUTE TO RISK: None identified   SUICIDE RISK:   Moderate:  Frequent suicidal ideation with limited intensity, and duration, some specificity in terms of plans, no associated intent, good self-control, limited dysphoria/symptomatology, some risk factors present, and identifiable protective factors, including available and accessible social support.  PLAN OF CARE: Supportive approach/coping skills/relapse prevention                               Clonidine Detox/reassess comorbidites  I certify that inpatient services furnished can reasonably be expected to improve the patient's condition.  Trennon Torbeck A 10/18/2012, 1:43 PM

## 2012-10-18 NOTE — ED Notes (Signed)
[facility] called for transport to [facility]. " Will send a truck shortly", per [place].

## 2012-10-18 NOTE — Progress Notes (Signed)
Patient ID: CORBYN WILDEY, male   DOB: 01-29-1986, 29 y.o.   MRN: [id number] Diamante is a 26y/o s/w/m admitted voluntarily following medical clearance.  Patient presented with chief c/o suicidal ideations and stated in ED that he had taken "a handful" of aleve 4 days ago but that it it only caused him abdominal discomfort.  Presents here with depression and SA issues including an 18 month history of opiate abuse (reported vicodin,percocet, and methadone) that he "got off the streets".  Approximate use of $100 per day. States last use was 4 days ago and the only lingering withdrawal symptom is restless leg movements.  States he is motivated for sobriety.  Reports infrequent etoh use but regular THC use.  Patient is quiet and cooperative. Denies pertinent medical hx. Prvious psych dx of PTSD but has been unable to continue medications due to job loss and subseqent financial difficulties.  Denies current SI and contracts for safety.  Oriented to unit following search. 15' checks and POC initiated.  Remains safe on the unit.

## 2012-10-18 NOTE — ED Notes (Signed)
Report given to Elpidio Anis, Charity fundraiser.

## 2012-10-18 NOTE — Progress Notes (Signed)
Patient ID: Ryan Kelley, male   DOB: 1986-04-06, 27 y.o.   MRN: [id number]  D: Pt informed the writer he's been abusing narcotics for apprx 1.5 yrs. Writer attempted to clarify info given in reference to suicidal attempt. Pt stated he didn't to detox himself because he was "tired of living out of a bottle". Stated he was in pain due to withdrawals and didn't want to use narcotics, so he took aleve. Stated after a while he took more aleve in effort to alleviate the pain.  Stated he took a total of 15 aleve over a time period.   A:  Support and encouragement was offered. 15 min checks continued for safety.  R: Pt remains safe.

## 2012-10-18 NOTE — ED Notes (Signed)
Report called to brittany at [facility].

## 2012-10-18 NOTE — ED Notes (Signed)
Pt awake, ate am meal tray, requested to know how much longer he will be here, advised unsure, Tommy from act to see pt. This am.

## 2012-10-18 NOTE — Tx Team (Signed)
Initial Interdisciplinary Treatment Plan  PATIENT STRENGTHS: (choose at least two) Ability for insight Average or above average intelligence Capable of independent living Communication skills Physical Health Supportive family/friends  PATIENT STRESSORS: Financial difficulties Medication change or noncompliance Occupational concerns Substance abuse   PROBLEM LIST: Problem List/Patient Goals Date to be addressed Date deferred Reason deferred Estimated date of resolution  Opiate abuse 10/18/12           PTSD 10/18/12           S/I plan to overdose 10/18/12           Depression 10/18/12                  DISCHARGE CRITERIA:  Ability to meet basic life and health needs Need for constant or close observation no longer present Reduction of life-threatening or endangering symptoms to within safe limits Safe-care adequate arrangements made Withdrawal symptoms are absent or subacute and managed without 24-hour nursing intervention  PRELIMINARY DISCHARGE PLAN: Attend 12-step recovery group Return to previous living arrangement Return to previous work or school arrangements  PATIENT/FAMIILY INVOLVEMENT: This treatment plan has been presented to and reviewed with the patient, Ryan Kelley, and/or family member.  The patient and family have been given the opportunity to ask questions and make suggestions.  [name] 10/18/2012, 1:12 PM

## 2012-10-18 NOTE — ED Notes (Signed)
Pt has been accepted at [facility].  Awaiting, Tommy from [facility] to complete paperwork.

## 2012-10-18 NOTE — H&P (Addendum)
Psychiatric Admission Assessment Adult  Patient Identification:  Ryan Kelley Date of Evaluation:  10/18/2012 Chief Complaint:  Major Depressive DO  History of Present Illness:: Withdrawing from Percocet, Vicodin, Methadone. Has been using for one and a half year. Broke ribs given Vicodin, made him feel good, started buying on the street. Before th;is episode denies. States he has PTSD from seeing his brother die DEC 2005, 29 Y/O accidental OD of methadone. He was there he was talking to him in another minute he was unresponsive. He tried CPR did nor work (he was 27 Y/O). Still with memories, flashbacks, wakes up in the middle of the night crying. Admits he wakes up with chest tight throat closing on him. Having a full panic attack. Smokes marijuana every day.  Elements:  Location:  in patient. Quality:  unable to fucntion. Severity:  severe. Timing:  every day. Duration:  year and one half. Context:  PTSD with opioid dependence. Associated Signs/Synptoms: Depression Symptoms:  depressed mood, anhedonia, fatigue, feelings of worthlessness/guilt, difficulty concentrating, hopelessness, impaired memory, suicidal thoughts with specific plan, anxiety, panic attacks, loss of energy/fatigue, disturbed sleep, weight gain, (Hypo) Manic Symptoms:  Denies Anxiety Symptoms:  Excessive Worry, Panic Symptoms, Psychotic Symptoms:  Denies PTSD Symptoms: Had a traumatic exposure:  brother died of an OD, in front of him Re-experiencing:  Flashbacks Intrusive Thoughts Nightmares Hypervigilance:  Yes Hyperarousal:  Difficulty Concentrating Emotional Numbness/Detachment Irritability/Anger  Psychiatric Specialty Exam: Physical Exam  Review of Systems  Constitutional: Positive for diaphoresis.  HENT: Negative.   Eyes: Negative.   Respiratory: Positive for cough.        Smokes pack a day 9 years  Cardiovascular: Negative.   Gastrointestinal: Negative.   Genitourinary: Negative.    Musculoskeletal: Positive for myalgias and back pain.  Skin: Negative.   Neurological: Positive for weakness.  Endo/Heme/Allergies: Negative.   Psychiatric/Behavioral: Positive for depression, suicidal ideas and substance abuse. The patient is nervous/anxious.     There were no vitals taken for this visit.There is no weight on file to calculate BMI.  General Appearance: Fairly Groomed  Patent attorney::  Fair  Speech:  Clear and Coherent, Slow and not spontaneous  Volume:  Decreased  Mood:  Anxious and Depressed  Affect:  Restricted  Thought Process:  Coherent and Goal Directed  Orientation:  Full (Time, Place, and Person)  Thought Content:  Rumination and worries, concerns, trying to quit using opioids, grievieng the death of his brother, guilt for living longer than his brother  Suicidal Thoughts:  Yes.  without intent/plan  Homicidal Thoughts:  No  Memory:  Immediate;   Fair Recent;   Fair Remote;   Fair  Judgement:  Fair  Insight:  Present  Psychomotor Activity:  Restlessness  Concentration:  Fair  Recall:  Fair  Akathisia:  No  Handed:  Right  AIMS (if indicated):     Assets:  Desire for Improvement Housing Social Support Transportation Vocational/Educational  Sleep:       Past Psychiatric History: Diagnosis: PTSD, Opioid Depedence  Hospitalizations: Denies  Outpatient Care: [facility])   Substance Abuse Care: Denies  Self-Mutilation: Denies  Suicidal Attempts: Denies  Violent Behaviors: Denies   Past Medical History:   Past Medical History  Diagnosis Date  . Substance abuse   . PTSD (post-traumatic stress disorder)   . Attempted suicide     Allergies:  No Known Allergies PTA Medications: Prescriptions prior to admission  Medication Sig Dispense Refill  . naproxen sodium (ANAPROX) 220 MG  tablet Take 440 mg by mouth 2 (two) times daily with a meal.        Previous Psychotropic Medications:  Medication/Dose  Valium, Celexa                Substance Abuse History in the last 12 months:  yes  Consequences of Substance Abuse: Legal Consequences:  drinking under age Withdrawal Symptoms:   Cramps Diarrhea Nausea Vomiting aches pains  Social History:  reports that he has been smoking Cigarettes.  He has a 7 pack-year smoking history. He does not have any smokeless tobacco history on file. He reports that  drinks alcohol. He reports that he uses illicit drugs (Marijuana) about 5 times per week. Additional Social History:                      Current Place of Residence:  Lives with girlfriend, and her 2 kids Place of Birth:   Family Members: Marital Status:  Single Children:  Sons:  Daughters: Relationships: Education:  Has college Publishing rights manager Problems/Performance: Religious Beliefs/Practices: Not active in church History of Abuse (Emotional/Phsycial/Sexual) Occupational Experiences; Low Voltage pull cables Engineer, technical sales) for [employer] History:  None. Legal History: Felony braking and entering 16 months  Hobbies/Interests:  Family History:   Family History  Problem Relation Age of Onset  . Diabetes Mother   . Cancer Mother   . Hypertension Mother   . Hypertension Father    Father alcohol, Mother, anxiety, depression, Addictions both sides of the family Results for orders placed during the hospital encounter of 10/17/12 (from the past 72 hour(s))  CBC WITH DIFFERENTIAL     Status: None   Collection Time    10/17/12  2:04 PM      Result Value Range   WBC 10.4  4.0 - 10.5 K/uL   RBC 5.16  4.22 - 5.81 MIL/uL   Hemoglobin 16.2  13.0 - 17.0 g/dL   HCT 16.1  09.6 - 04.5 %   MCV 89.3  78.0 - 100.0 fL   MCH 31.4  26.0 - 34.0 pg   MCHC 35.1  30.0 - 36.0 g/dL   RDW 40.9  81.1 - 91.4 %   Platelets 286  150 - 400 K/uL   Neutrophils Relative 67  43 - 77 %   Neutro Abs 7.0  1.7 - 7.7 K/uL   Lymphocytes Relative 27  12 - 46 %   Lymphs Abs 2.8  0.7 - 4.0  K/uL   Monocytes Relative 4  3 - 12 %   Monocytes Absolute 0.5  0.1 - 1.0 K/uL   Eosinophils Relative 1  0 - 5 %   Eosinophils Absolute 0.1  0.0 - 0.7 K/uL   Basophils Relative 0  0 - 1 %   Basophils Absolute 0.0  0.0 - 0.1 K/uL  COMPREHENSIVE METABOLIC PANEL     Status: Abnormal   Collection Time    10/17/12  2:04 PM      Result Value Range   Sodium 140  135 - 145 mEq/L   Potassium 4.1  3.5 - 5.1 mEq/L   Chloride 101  96 - 112 mEq/L   CO2 28  19 - 32 mEq/L   Glucose, Bld 105 (*) 70 - 99 mg/dL   BUN 17  6 - 23 mg/dL   Creatinine, Ser 7.82  0.50 - 1.35 mg/dL   Calcium 9.8  8.4 - 95.6 mg/dL   Total Protein 7.3  6.0 - 8.3 g/dL   Albumin 4.5  3.5 - 5.2 g/dL   AST 18  0 - 37 U/L   ALT 21  0 - 53 U/L   Alkaline Phosphatase 71  39 - 117 U/L   Total Bilirubin 0.3  0.3 - 1.2 mg/dL   GFR calc non Af Amer >90  >90 mL/min   GFR calc Af Amer >90  >90 mL/min   Comment:            The eGFR has been calculated     using the CKD EPI equation.     This calculation has not been     validated in all clinical     situations.     eGFR's persistently     <90 mL/min signify     possible Chronic Kidney Disease.  ETHANOL     Status: None   Collection Time    10/17/12  2:04 PM      Result Value Range   Alcohol, Ethyl (B) <11  0 - 11 mg/dL   Comment:            LOWEST DETECTABLE LIMIT FOR     SERUM ALCOHOL IS 11 mg/dL     FOR MEDICAL PURPOSES ONLY  URINE RAPID DRUG SCREEN (HOSP PERFORMED)     Status: Abnormal   Collection Time    10/17/12  2:20 PM      Result Value Range   Opiates POSITIVE (*) NONE DETECTED   Cocaine NONE DETECTED  NONE DETECTED   Benzodiazepines POSITIVE (*) NONE DETECTED   Amphetamines NONE DETECTED  NONE DETECTED   Tetrahydrocannabinol POSITIVE (*) NONE DETECTED   Barbiturates NONE DETECTED  NONE DETECTED   Comment:            DRUG SCREEN FOR MEDICAL PURPOSES     ONLY.  IF CONFIRMATION IS NEEDED     FOR ANY PURPOSE, NOTIFY LAB     WITHIN 5 DAYS.                 LOWEST DETECTABLE LIMITS     FOR URINE DRUG SCREEN     Drug Class       Cutoff (ng/mL)     Amphetamine      1000     Barbiturate      200     Benzodiazepine   200     Tricyclics       300     Opiates          300     Cocaine          300     THC              50   Psychological Evaluations:  Assessment:   AXIS I:  Post Traumatic Stress Disorder, Major Depression, Opioid Dependence/withdrawal, Marijuana Abuse AXIS II:  Deferred AXIS III:   Past Medical History  Diagnosis Date  . Substance abuse   . PTSD (post-traumatic stress disorder)   . Attempted suicide    AXIS IV:  other psychosocial or environmental problems and problems with primary support group AXIS V:  41-50 serious symptoms  Treatment Plan/Recommendations:  Supportive approach/coping skills/relapse prevention  Detox, reassess co morbidities  Treatment Plan Summary: Daily contact with patient to assess and evaluate symptoms and progress in treatment Medication management Current Medications:  No current facility-administered medications for this encounter.    Observation Level/Precautions:  15 minute checks  Laboratory:  As per the ED  Psychotherapy:  Individual/group  Medications:  Clonidine detox/reassess co morbidities  Consultations:    Discharge Concerns:    Estimated LOS: 5-7 days  Other:     I certify that inpatient services furnished can reasonably be expected to improve the patient's condition.   LUGO,IRVING A 3/12/201412:51 PM

## 2012-10-18 NOTE — Progress Notes (Signed)
Adult Psychoeducational Group Note  Date:  10/18/2012 Time:  8:40 PM  Group Topic/Focus:  AA group  Participation Level:  Active  Participation Quality:  Appropriate  Affect:  Appropriate  Cognitive:  Alert  Insight: Appropriate  Engagement in Group:  Improving  Modes of Intervention:  Discussion  Additional Comments:    Flonnie Hailstone 10/18/2012, 8:40 PM

## 2012-10-18 NOTE — Progress Notes (Signed)
[facility] LCSW Group Therapy  Emotion Regulation  10/18/2012 2:26 PM   Type of Therapy: Group Therapy    Participation Level: Active   Participation Quality: Appropriate and Attentive   Affect: Appropriate   Cognitive: Alert and Appropriate   Insight: Developing/Improving   Engagement in Therapy: Engaged   Modes of Intervention: Discussion, Education, Rapport Building and Support   Summary of Progress/Problems:Todays group topic was Emotion Regulation. Pts participated in activity where they chose two photographs provided by SW Intern, one that represented a positive emotion and one that represented a negative emotion. Pts processed ways to cope with negative emotions and behaviors that they could engage in to evoke their positive emotions.  Pt attended group. His affect was bright and open. Pt chose a photo of a bridge for his positive emotion.  He shared that he identified with this photograph because he can see the light at the end of the tunnel.  He shared that he is taking the steps to get help so there is an emotion of relief and anticipation for more positive things to come.  He chose a photo of a bike that he shared looked broken down and had parts missing as his negative.  He disclosed that he identified with feeling this way as well.  Pt processed that his two photograph choices were connected and that he is looking to get the help he needs following dc.    Pt shared that he would like IOP following dc.  He stated that he plans to return home following dc. He rates depression at five, anxiety at 5, hopelessness and helplessness at three.  Pt denies SI/HI.  He reports that he is experiencing dt symptoms of restlessness and dull pain which he rates at 5/6.

## 2012-10-19 MED ORDER — NICOTINE 14 MG/24HR TD PT24
14.0000 mg | MEDICATED_PATCH | Freq: Every day | TRANSDERMAL | Status: DC
  Administered 2012-10-19 – 2012-10-21 (×3): 14 mg via TRANSDERMAL
  Filled 2012-10-19 (×4): qty 1

## 2012-10-19 NOTE — Progress Notes (Addendum)
D:  Patient's self inventory sheet, patient sleeps well, has good appetite, normal energy level, good attention span.  Rated depression #3, hopelessness #1.  Denied withdrawals.  Denied SI.  Has experienced pain in past  24 hours.  Zero pain goal, worst pain #1.  After discharge, plans to quit smoking, exercise, garden to occupy his free time.  No questions for staff at this time.   Does have discharge plans.  No problems taking meds after discharge. A:  Medications administered per MD order.  Support and encouragement given throughout day. R:  Following treatment plan.  Denied SI and HI.  Denied A/V hallucinations.  Contracts for safety. Did not attend afternoon group.

## 2012-10-19 NOTE — Progress Notes (Signed)
[facility] LCSW Aftercare Discharge Planning Group Note  10/19/2012 11:58 AM  Participation Quality:  Attentive and Appropriate  Affect:  Appropriate  Cognitive:  Alert and Appropriate  Insight:  Improving  Engagement in Group:  Engaged  Modes of Intervention:  Education, Dentist, Scientist, physiological of Progress/Problems: Pt affect was bright and positive. His posture was open and his disclosures were optimistic.  He rates depression at 3 and all other symptoms at zero. Pt plans to return to his girlfriends home following dc.  Pt would like to follow up at [facility] at dc.  Bournes, Roshelle L 10/19/2012, 11:58 AM

## 2012-10-19 NOTE — Progress Notes (Signed)
[facility] LCSW Group Therapy  10/19/2012 3:28 PM  Type of Therapy:  Group Therapy  Participation Level:  Active  Participation Quality:  Appropriate and Attentive  Affect:  Appropriate  Cognitive:  Alert and Appropriate  Insight:  Developing/Improving  Engagement in Therapy:  Engaged   Modes of Intervention: Discussion, Education, Problem-solving, Rapport Building and Support   Summary of Progress/Problems: Pt attended psychoeducation group. Today's theme was "Living a Balanced Life." Pts processed what balance means to them, what ways help up find balance, and what things make [place] lose balance. Pts also took turns discussing things that they would like to let go of and things they would like to hold on to in order to maintain balance in their lives.  Pt shared that he would like to let go of his addiction and get closer to his family to maintain balance.  He processed that he plans to use the time he spent servicing his addiction to strengthen his familial relationships.  Pt is optimistic about his recovery and shared that he intends to stay committed to his treatment. Bournes, Roshelle L 10/19/2012, 3:28 PM

## 2012-10-19 NOTE — Progress Notes (Signed)
Patient resting quietly with eyes closed. Respirations even and unlabored. No distress noted, Q 15 minute check continues as ordered to maintain safety.  

## 2012-10-19 NOTE — Progress Notes (Signed)
Adult Psychoeducational Group Note  Date:  10/19/2012 Time:  7:09 PM  Group Topic/Focus:  Overcoming Stress:   The focus of this group is to define stress and help patients assess their triggers.  Participation Level:  Active  Participation Quality:  Appropriate  Affect:  Appropriate  Cognitive:  Appropriate  Insight: Appropriate  Engagement in Group:  Engaged  Modes of Intervention:  Discussion, Education and Support  Additional Comments:  Pt actively participated in group discussion on stress, including triggers for stress, physiological signs that one is stressed, and positive coping skills for dealing with stress. Pt shared numerous coping skills he uses in order to cope with feelings of stress.    Reinaldo Raddle K 10/19/2012, 7:09 PM

## 2012-10-19 NOTE — Progress Notes (Signed)
[facility] MD Progress Note  10/19/2012 3:34 PM TORIS LAVERDIERE  MRN:  [id number] Subjective:  Marcelle endorses that he is starting to feel better. Did sleep better last night. He is committed to abstinence. He is going to address the PTSD in therapy. States he has never been this committed. His girlfriend is very supportive She does not use. 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 He had stop using the methadone few days before he came so he should be starting to withdraw just about now.  Diagnosis:  Opioid Dependence, PTSD  ADL's:  Intact  Sleep: Fair  Appetite:  Fair  Suicidal Ideation:  Plan:  Denies Intent:  Denies Means:  Denies Homicidal Ideation:  Plan:  Denies Intent:  Denies Means:  Denies AEB (as evidenced by):  Psychiatric Specialty Exam: Review of Systems  Constitutional: Negative.   HENT: Negative.   Eyes: Negative.   Respiratory: Negative.   Cardiovascular: Negative.   Gastrointestinal: Negative.   Genitourinary: Negative.   Musculoskeletal: Negative.   Skin: Negative.   Neurological: Negative.   Endo/Heme/Allergies: Negative.   Psychiatric/Behavioral: Positive for depression and substance abuse. The patient is nervous/anxious.     Blood pressure 111/68, pulse 64, temperature 97.6 F (36.4 C), temperature source Oral, resp. rate 16, height 5\' 4"  (1.626 m), weight 90.719 kg (200 lb).Body mass index is 34.31 kg/(m^2).  General Appearance: Fairly Groomed  Patent attorney::  Fair  Speech:  Clear and Coherent  Volume:  Normal  Mood:  Anxious  Affect:  Appropriate  Thought Process:  Coherent and Goal Directed  Orientation:  Full (Time, Place, and Person)  Thought Content:  some worries, cocnerns  Suicidal Thoughts:  No  Homicidal Thoughts:  No  Memory:  Immediate;   Fair Recent;   Fair Remote;   Fair  Judgement:  Fair  Insight:  Present  Psychomotor Activity:  Normal  Concentration:  Fair  Recall:  Fair  Akathisia:  No  Handed:  Right  AIMS (if  indicated):     Assets:  Desire for Improvement Housing Vocational/Educational  Sleep:  Number of Hours: 6.75   Current Medications: Current Facility-Administered Medications  Medication Dose Route Frequency Provider Last Rate Last Dose  . acetaminophen (TYLENOL) tablet 650 mg  650 mg Oral Q6H PRN Rachael Fee, MD      . alum & mag hydroxide-simeth (MAALOX/MYLANTA) 200-200-20 MG/5ML suspension 30 mL  30 mL Oral Q4H PRN Rachael Fee, MD      . cloNIDine (CATAPRES) tablet 0.1 mg  0.1 mg Oral QID Rachael Fee, MD   0.1 mg at 10/19/12 1211   Followed by  . [START ON 10/20/2012] cloNIDine (CATAPRES) tablet 0.1 mg  0.1 mg Oral BH-qamhs Rachael Fee, MD       Followed by  . [START ON 10/22/2012] cloNIDine (CATAPRES) tablet 0.1 mg  0.1 mg Oral QAC breakfast Rachael Fee, MD      . dicyclomine (BENTYL) tablet 20 mg  20 mg Oral Q6H PRN Rachael Fee, MD      . hydrOXYzine (ATARAX/VISTARIL) tablet 25 mg  25 mg Oral Q6H PRN Rachael Fee, MD      . loperamide (IMODIUM) capsule 2-4 mg  2-4 mg Oral PRN Rachael Fee, MD   4 mg at 10/18/12 1436  . magnesium hydroxide (MILK OF MAGNESIA) suspension 30 mL  30 mL Oral Daily PRN Rachael Fee, MD      . methocarbamol (ROBAXIN) tablet  500 mg  500 mg Oral Q8H PRN Rachael Fee, MD   500 mg at 10/18/12 2208  . naproxen (NAPROSYN) tablet 500 mg  500 mg Oral BID PRN Rachael Fee, MD   500 mg at 10/18/12 1437  . nicotine (NICODERM CQ - dosed in mg/24 hours) patch 14 mg  14 mg Transdermal Daily Rachael Fee, MD   14 mg at 10/19/12 0859  . ondansetron (ZOFRAN-ODT) disintegrating tablet 4 mg  4 mg Oral Q6H PRN Rachael Fee, MD      . traZODone (DESYREL) tablet 50 mg  50 mg Oral QHS PRN Rachael Fee, MD   50 mg at 10/18/12 2208    Lab Results: No results found for this or any previous visit (from the past 48 hour(s)).  Physical Findings: AIMS: Facial and Oral Movements Muscles of Facial Expression: None, normal Lips and Perioral Area: None, normal Jaw:  None, normal Tongue: None, normal,Extremity Movements Upper (arms, wrists, hands, fingers): None, normal Lower (legs, knees, ankles, toes): None, normal, Trunk Movements Neck, shoulders, hips: None, normal, Overall Severity Severity of abnormal movements (highest score from questions above): None, normal Incapacitation due to abnormal movements: None, normal Patient's awareness of abnormal movements (rate only patient's report): No Awareness, Dental Status Current problems with teeth and/or dentures?: No Does patient usually wear dentures?: No  CIWA:  CIWA-Ar Total: 1 COWS:  COWS Total Score: 1  Treatment Plan Summary: Daily contact with patient to assess and evaluate symptoms and progress in treatment Medication management  Plan: Supportive approach/coping skills/relapse prevention           Complete the detox           CBT to address the anxiety  Medical Decision Making Problem Points:  Review of psycho-social stressors (1) Data Points:  Review of medication regiment & side effects (2)  I certify that inpatient services furnished can reasonably be expected to improve the patient's condition.   LUGO,IRVING A 10/19/2012, 3:34 PM

## 2012-10-19 NOTE — Progress Notes (Signed)
Recreation Therapy Notes  Date: 03.13.2014 Time: 3:00pm Location: [address]       Group Topic/Focus: Leisure Education  Participation Level: Did not attend  [employer], LRT/CTRS         Jearl Klinefelter 10/19/2012 4:11 PM

## 2012-10-20 NOTE — [facility] Counselor (Addendum)
Adult Comprehensive Assessment  Patient ID: Ryan Kelley, male   DOB: 10-May-1986, 27 y.o.   MRN: [id number]  Information Source: Information source: Patient  Current Stressors:  Educational / Learning stressors: N/A Employment / Job issues: N/A Family Relationships: N/A Surveyor, quantity / Lack of resources (include bankruptcy): N/A Housing / Lack of housing: N/A Physical health (include injuries & life threatening diseases): N/A Social relationships: N/A Substance abuse: Pt reports that he wants to stop smoking cigarettes.  Pt step father whom he works with smokes. Pt is concerned that being around his step father will hinder his ability to quit. Bereavement / Loss: N/A Living/Environment/Situation:  Living Arrangements: Spouse/significant other Living conditions (as described by patient or guardian): Pt lives with his girlfriend who is supportive of his recovery How long has patient lived in current situation?: December 2013 What is atmosphere in current home: Comfortable;Loving;Supportive  Family History:  Marital status: Single Does patient have children?: No  Childhood History:  By whom was/is the patient raised?: Mother Additional childhood history information: N/A Description of patient's relationship with caregiver when they were a child: Loving and Supportive Patient's description of current relationship with people who raised him/her: Loving and Supportive Does patient have siblings?: No Did patient suffer any verbal/emotional/physical/sexual abuse as a child?: No Did patient suffer from severe childhood neglect?: No Has patient ever been sexually abused/assaulted/raped as an adolescent or adult?: No Was the patient ever a victim of a crime or a disaster?: No Witnessed domestic violence?: No Has patient been effected by domestic violence as an adult?: No  Education:  Highest grade of school patient has completed: Some Automotive engineer Currently a Consulting civil engineer?: No Learning disability?:  No  Employment/Work Situation:   Employment situation: Employed 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 How long has patient been employed?: 8 years Patient's job has been impacted by current illness: No What is the longest time patient has a held a job?: 8 years 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 Has patient ever been in the [employer]?: No Has patient ever served in combat?: No  Financial Resources:   Financial resources: Income from employment;Food stamps Does patient have a representative payee or guardian?: No  Alcohol/Substance Abuse:   What has been your use of drugs/alcohol within the last 12 months?: 5mg  Hydrocodone 16-20 daily, Percocete 10mg  8 daily, Methedone 10mg  4-5 daily If attempted suicide, did drugs/alcohol play a role in this?: Yes Alcohol/Substance Abuse Treatment Hx: Denies past history If yes, describe treatment: N/A Has alcohol/substance abuse ever caused legal problems?: No  Social Support System:   Patient's Community Support System: Good Describe Community Support System: Pt family and girlfriend are loving and supportive Type of faith/religion: Ephriam Knuckles How does patient's faith help to cope with current illness?: Prayer  Leisure/Recreation:   Leisure and Hobbies: Ride dirt bike, fishing, and playing games.  Strengths/Needs:   What things does the patient do well?: Painting, sculpting, and fixing things. In what areas does patient struggle / problems for patient: Worrying and over thinking situations  Discharge Plan:   Does patient have access to transportation?: Yes Will patient be returning to same living situation after discharge?: Yes Currently receiving community mental health services: No If no, would patient like referral for services when discharged?: Yes (What county?) [facility]) Does patient have financial barriers related to discharge medications?: No  Summary/Recommendations:   Summary  and Recommendations (to be completed by the evaluator): Ryan Kelley is an 27 y.o. male.  There patient presented to the ED with complaints of increased depression and current suicidal ideations. Pt will benefit from crisis stabalization, medication management, psychoeducation groups to increase coping skills, and discharge planning to follow up care.  Bournes, Roshelle L. 10/20/2012  Reviewed by  Clinical Lead:  Hannah Nail, LCSW at 1:05pm

## 2012-10-20 NOTE — Plan of Care (Signed)
Problem: Ineffective individual coping Goal: STG: Patient will participate in after care plan Outcome: Progressing Patient has agreed to step up services at [facility] in [place] from individual counseling to CD IOP groups.   Problem: Alteration in mood & ability to function due to Goal: LTG-Patient demonstrates decreased signs of withdrawal (Patient demonstrates decreased signs of withdrawal to the point the patient is safe to return home and continue treatment in an outpatient setting)  Outcome: Progressing Patient is calmer today, reportedly highly energetic on 3/13.

## 2012-10-20 NOTE — Progress Notes (Signed)
[facility] INPATIENT:  Family/Significant Other Suicide Prevention Education  Suicide Prevention Education:  Education Completed; Chrystal Latricia Heft (Girlfriend [phone] been identified by the patient as the family member/significant other with whom the patient will be residing, and identified as the person(s) who will aid the patient in the event of a mental health crisis (suicidal ideations/suicide attempt).  With written consent from the patient, the family member/significant other has been provided the following suicide prevention education, prior to the and/or following the discharge of the patient.  The suicide prevention education provided includes the following:  Suicide risk factors  Suicide prevention and interventions  National Suicide Hotline telephone number  [facility] assessment telephone number  [facility] Emergency Assistance 911  [facility] and/or Residential Mobile Crisis Unit telephone number  Request made of family/significant other to:  Remove weapons (e.g., guns, rifles, knives), all items previously/currently identified as safety concern.    Remove drugs/medications (over-the-counter, prescriptions, illicit drugs), all items previously/currently identified as a safety concern.  The family member/significant other verbalizes understanding of the suicide prevention education information provided.  The family member/significant other agrees to remove the items of safety concern listed above.  Bournes, Roshelle L 10/20/2012, 4:13 PM

## 2012-10-20 NOTE — Progress Notes (Signed)
[facility] Group Notes:  (Nursing/MHT/Case Management/Adjunct)  Date:  10/19/2012 Time:  2100   Type of Therapy:  wrap up group  Participation Level:  Active  Participation Quality:  Appropriate and Attentive  Affect:  Appropriate  Cognitive:  Appropriate  Insight:  Good  Engagement in Group:  Engaged  Modes of Intervention:  Clarification, Education and Support  Summary of Progress/Problems: Pt reported enjoying a visit from his girlfriend whom is a support in his life.  Pt reported being disappointed that his mother didn't show; pt was expecting her. This Clinical research associate asked if mother was someone he had to depend on a daily basis. Pt reported no just wanted her to care.  Pt stated "something more important must have come up". Pt is grateful to "breathe another breath", and wants to work on alleviating and dealing what his stress.  Pt reported that he has learned some meditation techniques while in the hospital and has already started to practice them on his own.   Shelah Lewandowsky 10/20/2012, 12:26 AM

## 2012-10-20 NOTE — Progress Notes (Signed)
[facility] MD Progress Note  10/20/2012 4:27 PM ARGEL PABLO  MRN:  [id number] Subjective:  Craigory states that he is starting to feel better. He has a job waiting for him when he gets out. He is encouraged and motivated. He states that he is generally shy, what might be seen that he is depressed. He has received support from his family, his girlfriend.He is motivated to get involved in psychotherapy to addresses the trauma of the death of his brother Diagnosis:  Opioid Dependence, PTSD  ADL's:  Intact  Sleep: Fair  Appetite:  Fair  Suicidal Ideation:  Plan:  denies Intent:  denies Means:  denies Homicidal Ideation:  Plan:  denies Intent:  denies Means:  denies AEB (as evidenced by):  Psychiatric Specialty Exam: Review of Systems  Constitutional: Negative.   HENT: Negative.   Eyes: Negative.   Respiratory: Negative.   Cardiovascular: Negative.   Gastrointestinal: Negative.   Genitourinary: Negative.   Musculoskeletal: Negative.   Skin: Negative.   Neurological: Negative.   Endo/Heme/Allergies: Negative.   Psychiatric/Behavioral: Positive for substance abuse.    Blood pressure 121/76, pulse 70, temperature 97.7 F (36.5 C), temperature source Oral, resp. rate 16, height 5\' 4"  (1.626 m), weight 90.719 kg (200 lb).Body mass index is 34.31 kg/(m^2).  General Appearance: Fairly Groomed  Patent attorney::  Fair  Speech:  Clear and Coherent and not spontaneous  Volume:  Normal  Mood:  Euthymic  Affect:  Restricted  Thought Process:  Coherent and Goal Directed  Orientation:  Full (Time, Place, and Person)  Thought Content:  worries, concerns, memories of his dead brother  Suicidal Thoughts:  No  Homicidal Thoughts:  No  Memory:  Immediate;   Fair Recent;   Fair Remote;   Fair  Judgement:  Fair  Insight:  Present  Psychomotor Activity:  Decrease  Concentration:  Fair  Recall:  Fair  Akathisia:  No  Handed:  Right  AIMS (if indicated):     Assets:  Desire for Improvement  Sleep:   Number of Hours: 5.5   Current Medications: Current Facility-Administered Medications  Medication Dose Route Frequency Provider Last Rate Last Dose  . acetaminophen (TYLENOL) tablet 650 mg  650 mg Oral Q6H PRN Rachael Fee, MD      . alum & mag hydroxide-simeth (MAALOX/MYLANTA) 200-200-20 MG/5ML suspension 30 mL  30 mL Oral Q4H PRN Rachael Fee, MD      . cloNIDine (CATAPRES) tablet 0.1 mg  0.1 mg Oral BH-qamhs Rachael Fee, MD   0.1 mg at 10/20/12 0802   Followed by  . [START ON 10/22/2012] cloNIDine (CATAPRES) tablet 0.1 mg  0.1 mg Oral QAC breakfast Rachael Fee, MD      . dicyclomine (BENTYL) tablet 20 mg  20 mg Oral Q6H PRN Rachael Fee, MD      . hydrOXYzine (ATARAX/VISTARIL) tablet 25 mg  25 mg Oral Q6H PRN Rachael Fee, MD      . loperamide (IMODIUM) capsule 2-4 mg  2-4 mg Oral PRN Rachael Fee, MD   4 mg at 10/18/12 1436  . magnesium hydroxide (MILK OF MAGNESIA) suspension 30 mL  30 mL Oral Daily PRN Rachael Fee, MD      . methocarbamol (ROBAXIN) tablet 500 mg  500 mg Oral Q8H PRN Rachael Fee, MD   500 mg at 10/19/12 2203  . naproxen (NAPROSYN) tablet 500 mg  500 mg Oral BID PRN Rachael Fee, MD   500  mg at 10/18/12 1437  . nicotine (NICODERM CQ - dosed in mg/24 hours) patch 14 mg  14 mg Transdermal Daily Rachael Fee, MD   14 mg at 10/20/12 0801  . ondansetron (ZOFRAN-ODT) disintegrating tablet 4 mg  4 mg Oral Q6H PRN Rachael Fee, MD      . traZODone (DESYREL) tablet 50 mg  50 mg Oral QHS PRN Rachael Fee, MD   50 mg at 10/19/12 2203    Lab Results: No results found for this or any previous visit (from the past 48 hour(s)).  Physical Findings: AIMS: Facial and Oral Movements Muscles of Facial Expression: None, normal Lips and Perioral Area: None, normal Jaw: None, normal Tongue: None, normal,Extremity Movements Upper (arms, wrists, hands, fingers): None, normal Lower (legs, knees, ankles, toes): None, normal, Trunk Movements Neck, shoulders, hips: None,  normal, Overall Severity Severity of abnormal movements (highest score from questions above): None, normal Incapacitation due to abnormal movements: None, normal Patient's awareness of abnormal movements (rate only patient's report): No Awareness, Dental Status Current problems with teeth and/or dentures?: No Does patient usually wear dentures?: No  CIWA:  CIWA-Ar Total: 0 COWS:  COWS Total Score: 5  Treatment Plan Summary: Daily contact with patient to assess and evaluate symptoms and progress in treatment Medication management  Plan: Supportive approach/coping skills/relapse prevention           Complete the detox            Start working on the feelings associated to the death of his brother  Medical Decision Making Problem Points:  Review of last therapy session (1) and Review of psycho-social stressors (1) Data Points:  Review of medication regiment & side effects (2)  I certify that inpatient services furnished can reasonably be expected to improve the patient's condition.   LUGO,IRVING A 10/20/2012, 4:27 PM

## 2012-10-20 NOTE — Progress Notes (Addendum)
[facility] LCSW Aftercare Discharge Planning Group Note  10/20/2012 8:79M  Participation Quality:  Attentiive and Sharing  Affect:  Flat  Cognitive:  Alert and Oriented  Insight:  Developing/Improving, Engaged, Improving, Lacking and Limited  Engagement in Group:  Developing/Improving, Engaged, Improving, Lacking and Limited  Modes of Intervention:  Clarification, Exploration, Rapport Building and Support  Summary of Progress/Problems:  Pt denies both suicidal and homicidal ideation.  On a scale of 1 to 10 with ten being the most ever experienced, the patient rates depression at a 2 and anxiety at a 0. Marte agreed to increase accountability by going to CD IOP at [facility] verses simnply going bck for individual therapy.    Clide Dales

## 2012-10-20 NOTE — Progress Notes (Signed)
[facility] Adult Case Management Discharge Plan :  Will you be returning to the same living situation after discharge: Yes,  home with family At discharge, do you have transportation home?:Yes,  family Do you have the ability to pay for your medications:Yes,  at reduced cost through [facility]  Release of information consent forms completed and in the chart;  Patient's signature needed at discharge.  Patient to Follow up at: Follow-up Information   Follow up with [facility] . (Go to [facility] not later than 10/27/12 between hours of 9AM and 1 PM. )    Contact information:   [address] [place], [place] [address] Ph [phone] Valinda Hoar [phone]      Patient denies SI/HI:   Yes,  denies both    Safety Planning and Suicide Prevention discussed:  Yes,  individually with patient and via telephone with significant other  Clide Dales 10/20/2012, 4:26 PM

## 2012-10-20 NOTE — Progress Notes (Signed)
D: Patient denies SI/HI and A/V hallucinations; patient reports sleep is well; reports appetite is good ; reports energy level is normal ; reports ability to pay attention is good; rates depression as 2/10; rates hopelessness 0/10;  A: Monitored q 15 minutes; patient encouraged to attend groups; patient educated about medications; patient given medications per physician orders; patient encouraged to express feelings and/or concerns  R: Patient is flat in affect but otherwise appropriate in circumstances; patient's interaction with staff and peers is minimal; patient reports no withdrawal symptoms but seems more withdrawn today than reported; patient is taking medications as prescribed and tolerating medications; patient is attending all groups

## 2012-10-20 NOTE — Tx Team (Addendum)
Interdisciplinary Treatment Plan Update (Adult)  Date: 10/20/2012  Time Reviewed: 10:08 AM   Progress in Treatment: Attending groups: Yes Participating in groups: Yes Taking medication as prescribed:  Yes Tolerating medication:  Yes Family/Significant othe contact made: Not as yet Patient understands diagnosis: Yes Discussing patient identified problems/goals with staff: Yes Medical problems stabilized or resolved:  Yes Denies suicidal/homicidal ideation: Yes Patient has not harmed self or Others: Yes  New problem(s) identified: None Identified  Discharge Plan or Barriers:  Patient will followup at [facility] IOP   Additional comments: N/A  Reason for Continuation of Hospitalization: Medication stabilization Withdrawal symptoms   Estimated length of stay: 1 days  For review of initial/current patient goals, please see plan of care.  Attendees: Patient:     Family:     Physician:  Geoffery Lyons 10/20/2012 10:08 AM   Nursing:   Burnetta Sabin, RN 10/20/2012 10:08 AM   Clinical Social Worker Ronda Fairly 10/20/2012 10:08 AM   Other:  Concha Se, JMSW Intern 10/20/2012 10:08 AM   Other:   Vesta Mixer Liaison  10/20/2012 10:08 AM   Other:  Serena Colonel, PA 10/20/2012 10:08 AM   Other:   10/20/2012 10:08 AM    Scribe for Treatment Team:   Carney Bern, LCSWA  10/20/2012 10:08 AM

## 2012-10-20 NOTE — Progress Notes (Signed)
[facility] Group Notes:  (Nursing/MHT/Case Management/Adjunct)  Date:  10/20/2012  Time:  11:32 AM  Type of Therapy:  Psychoeducational Skills  Participation Level:  Active  Participation Quality:  Appropriate and Attentive  Affect:  Appropriate and Flat  Cognitive:  Alert and Appropriate  Insight:  Appropriate and Good  Engagement in Group:  Improving  Modes of Intervention:  Activity and Socialization  Summary of Progress/Problems: Pt was engaged in Therapeutic Activity where pts participated in a game called "Would you rather". Pt appeared to understand the concept of the group.  Dalia Heading 10/20/2012, 11:32 AM

## 2012-10-20 NOTE — Progress Notes (Signed)
[facility] LCSW Group Therapy  10/20/2012 1:15 PM  Type of Therapy:  Group Therapy from 1:15 to 2:30 PM  Participation Level:  Actively Attentive  Participation Quality:  Appropriate  Affect:  Appropriate  Cognitive:  Alert and Oriented  Insight:  Developing  Engagement in Therapy:  Developing  Modes of Intervention:  Discussion, Education and Support  Summary of Progress/Problems: Group session included discussion on feelings about relapse and an educational portion on Post Acute Withdrawal Syndrome (PAWS).  Patients were able to process their feelings about early recovery and the cycle of addiction. [name] choose not to share.    Ryan Kelley

## 2012-10-20 NOTE — Progress Notes (Signed)
D   Pt is pleasant and cooperative   He attends and participates in groups and interacts well with others    He reports being vested in his recovery this time  He denies suicidal ideation at present and agreed if feeling suicidal he would notify staff and does contract not to harm himself while here A   Verbal support given  Medications administered and effectiveness monitored   Q 15 min checks R  Pt safe at present

## 2012-10-21 MED ORDER — NAPROXEN SODIUM 220 MG PO TABS: 440.0000 mg | ORAL_TABLET | Freq: Two times a day (BID) | ORAL | Status: DC

## 2012-10-21 MED ORDER — TRAZODONE HCL 50 MG PO TABS: 50.0000 mg | ORAL_TABLET | Freq: Every evening | ORAL | Status: DC | PRN

## 2012-10-21 NOTE — [facility] Suicide Risk Assessment (Signed)
Suicide Risk Assessment  Discharge Assessment     Patient is 27 y/o who admitted for opiate abuse. He states that he no longer has any craving or withdrawal symptoms. He will follow up with [facility].  He denies any medication side effects or complaints.  Demographic Factors:  Male, Adolescent or young adult and Caucasian  Mental Status Per Nursing Assessment::   On Admission:  Suicidal ideation indicated by patient;Intention to act on suicide plan  Current Mental Status by Physician:  Suicidal Ideation:  Plan: denies  Intent: denies  Means: denies  Homicidal Ideation:  Plan: denies  Intent: denies  Means: denies    Psychiatric Specialty Exam:  General Appearance: Fairly Groomed   Eye Contact: Good  Speech: Clear and Coherent  Volume: Normal   Mood: "A little Anxious but Good"  Affect: Full and congruent  Thought Process: Coherent and Goal Directed   Orientation: Full (Time, Place, and Person)   Thought Content: WDL  Suicidal Thoughts: No   Homicidal Thoughts: No   Memory: Immediate: Good Recent; Good Remote; Fair   Judgement: Fair   Insight: Intact  Psychomotor Activity:Normal  Concentration: Fair   Recall: Fair   Akathisia: No   Handed: Right   AIMS (if indicated): Not Indicated  Assets: Desire for Improvement   Sleep: 7 hours per patient.   Loss Factors: Death of brother by overdose of drugs  Historical Factors: Family history of mental illness or substance abuse  Risk Reduction Factors:   Sense of responsibility to family, Employed, Living with another person, especially a relative, Positive social support, Positive coping skills or problem solving skills and Follow up with [facility].  Continued Clinical Symptoms:  Alcohol/Substance Abuse/Dependencies  Cognitive Features That Contribute To Risk:  None currently  Suicide Risk:  Minimal: No identifiable suicidal ideation.  Patients presenting with no risk factors but with morbid ruminations; may be  classified as minimal risk based on the severity of the depressive symptoms  Discharge Diagnoses:   AXIS I:  Opioid Dependence, Posttraumatic Stress Disorder AXIS II:  No diagnosis AXIS III:  No Diagnosis AXIS IV:  other psychosocial or environmental problems AXIS V:  51-60 moderate symptoms  Plan Of Care/Follow-up recommendations:  Activity:  Increase as tolerated. Diet:  Regular Tests:  None Other:  Follow up with [facility]  Is patient on multiple antipsychotic therapies at discharge:  No   Has Patient had three or more failed trials of antipsychotic monotherapy by history:  No  Recommended Plan for Multiple Antipsychotic Therapies: Not Applicable.  Ryan Kelley 10/21/2012, 10:18 AM

## 2012-10-21 NOTE — Progress Notes (Signed)
Pt. Was very happy this pm.  His girlfriend visited and he reports that he will go live with her at discharge.  She is supportive and does not drink or smoke.  He is happy that his Mother has told him he will have a job when he leaves.  He has no signs of withdrawals, he denies SI and HI.  No complaints of pain or discomfort noted at this time.

## 2012-10-21 NOTE — Progress Notes (Signed)
Adult Psychoeducational Group Note  Date:  10/21/2012 Time:  9:41 AM  Group Topic/Focus:  Self-Inventory  Participation Level:  Minimal  Participation Quality:  Appropriate and Attentive  Affect:  Appropriate  Cognitive:  Alert and Appropriate  Insight: Appropriate  Engagement in Group:  Engaged  Modes of Intervention:  Clarification, Explanation, Support, Orientation, Rapport Building  Additional Comments:  Pt was appropriate and attentive during group, engaged in what others were sharing however he did not share and denied any questions or concerns at this time.  Alfonse Spruce 10/21/2012, 9:41 AM

## 2012-10-21 NOTE — Progress Notes (Signed)
D/C instructions/meds/follow-up appointments reviewed, pt verbalized understanding, pt's belongings returned to pt, samples given.D/C instructions/meds/follow-up appointments reviewed, pt verbalized understanding, pt's belongings returned to pt, samples given.

## 2012-10-21 NOTE — Progress Notes (Signed)
The patient attended the A. A. Meeting this evening.  

## 2012-10-21 NOTE — Clinical Social Work Note (Signed)
[facility] Group Notes:  (Clinical Social Work)  10/21/2012     10-11AM  Summary of Progress/Problems:   The main focus of today's process group was for the patient to identify ways in which they have in the past sabotaged their own recovery. Motivational Interviewing was utilized to ask the group members what they get out of their substance use, and what they want to change.  The Stages of Change were explained, and members identified where they currently are with regard to stages of change.  The patient expressed that that he smokes weed socially, then mixes it with pain pills because that is what his friends do, including crushing/snorting pain pills.  He feels he attempts to avoid facing problems by doing this, and has learned some coping mechanisms to help when discharged.  He states he is in the preparation stage of change, just waiting for discharge when he can immediately go into action stage of change.  Type of Therapy:  Group Therapy - Process   Participation Level:  Active  Participation Quality:  Appropriate, Attentive and Sharing  Affect:  Appropriate and smiling  Cognitive:  Alert, Appropriate and Oriented  Insight:  Engaged  Engagement in Therapy:  Engaged  Modes of Intervention:  Education, Teacher, English as a foreign language, Exploration, Discussion, Motivational Interviewing   Ambrose Mantle, LCSW 10/21/2012, 11:56 AM

## 2012-10-21 NOTE — Progress Notes (Signed)
Adult Psychoeducational Group Note  Date:  10/21/2012 Time:  1315  Group Topic/Focus:  Healthy Coping Skills  Participation Level:  Did Not Attend Pt refused to attend group  Ryan Kelley Shari Prows 10/21/2012, 2:12 PM

## 2012-10-21 NOTE — Discharge Summary (Addendum)
Physician Discharge Summary Note  Patient:  Ryan Kelley is an 27 y.o., male MRN:  [id number] DOB:  June 27, 1986 Patient phone:  [phone] (home)  Patient address:   [address] [place] [address],   Date of Admission:  10/18/2012 Date of Discharge: 10/21/12  Reason for Admission:  Opioid dependence  Discharge Diagnoses: Active Problems:   Opioid dependence   Opioid use with withdrawal   PTSD (post-traumatic stress disorder)  Review of Systems  Constitutional: Negative.   HENT: Negative.   Eyes: Negative.   Respiratory: Negative.   Cardiovascular: Negative.   Gastrointestinal: Negative.   Genitourinary: Negative.   Musculoskeletal: Negative.   Skin: Negative.   Neurological: Negative.   Endo/Heme/Allergies: Negative.   Psychiatric/Behavioral: Positive for substance abuse (Hx opioid abuse). Negative for depression, suicidal ideas, hallucinations and memory loss. The patient has insomnia (Stabilized with medication prior to discharge). The patient is not nervous/anxious.    Axis Diagnosis:   AXIS I:  Post Traumatic Stress Disorder and Opioid dependence,  AXIS II:  Deferred AXIS III:   Past Medical History  Diagnosis Date  . Substance abuse   . PTSD (post-traumatic stress disorder)   . Attempted suicide    AXIS IV:  Substance abuse problems, PTSD AXIS V:  64  Level of Care:  OP  Hospital Course: Withdrawing from Percocet, Vicodin, Methadone. Has been using for one and a half year. Broke ribs given Vicodin, made him feel good, started buying on the street. Before th;is episode denies. States he has PTSD from seeing his brother die DEC 2005, 25 Y/O accidental OD of methadone. He was there he was talking to him in another minute he was unresponsive. He tried CPR did nor work (he was 27 Y/O). Still with memories, flashbacks, wakes up in the middle of the night crying. Admits he wakes up with chest tight throat closing on him. Having a full panic attack. Smokes  marijuana every day.   Upon admission in this hospital, Ryan Kelley was started on clonidine protocol for his opiate detoxification. He was also enrolled in group counseling sessions and activities to learn coping skills that should help him to cope better and manage his substance abuse for a much longer sobriety. He was also enrolled/attended AA/NA meetings being offered and held on this unit. He has no previous and or identifiable medical conditions that required treatment and or monitoring. However, he was monitored closely for any potential problems that may arise as of and or during detoxification treatment. Patient tolerated his detoxification treatment without any significant adverse effects and or reactions reported.  Patient attended treatment team meeting this am and met with his treatment team members. His reason for admission, symptoms, substance abuse issues, response to treatment and discharge plans discussed. Patient endorsed that he is doing well and stable for discharge to pursue the next phase of his substance abuse treatment. It was then agreed upon that Ryan Kelley will follow-up psychiatric care at [facility] in [place], [place] on 10/27/12 between the hours of 09:00 am and 1:00 pm. He is aware that this is a walk-in appointment and should make every effort to make this appointment within the designated time.   Upon discharge, patient adamantly denies suicidal, homicidal ideations, auditory, visual hallucinations, delusional thinking and or withdrawal symptoms. Patient left [facility] with all personal belongings in no apparent distress, Transportation per family.  Consults:  None  Significant Diagnostic Studies:  labs: CBC with diff, CMP, UDS, Toxicology tests  Discharge Vitals:  Blood pressure 128/70, pulse 80, temperature 97.2 F (36.2 C), temperature source Oral, resp. rate 18, height 5\' 4"  (1.626 m), weight 90.719 kg (200 lb). Body mass index is 34.31 kg/(m^2). Lab Results:   No results  found for this or any previous visit (from the past 72 hour(s)).  Physical Findings: AIMS: Facial and Oral Movements Muscles of Facial Expression: None, normal Lips and Perioral Area: None, normal Jaw: None, normal Tongue: None, normal,Extremity Movements Upper (arms, wrists, hands, fingers): None, normal Lower (legs, knees, ankles, toes): None, normal, Trunk Movements Neck, shoulders, hips: None, normal, Overall Severity Severity of abnormal movements (highest score from questions above): None, normal Incapacitation due to abnormal movements: None, normal Patient's awareness of abnormal movements (rate only patient's report): No Awareness, Dental Status Current problems with teeth and/or dentures?: No Does patient usually wear dentures?: No  CIWA:  CIWA-Ar Total: 5 COWS:  COWS Total Score: 0  Psychiatric Specialty Exam: See Psychiatric Specialty Exam and Suicide Risk Assessment completed by Attending Physician prior to discharge.  Discharge destination:  Home  Is patient on multiple antipsychotic therapies at discharge:  No   Has Patient had three or more failed trials of antipsychotic monotherapy by history:  No  Recommended Plan for Multiple Antipsychotic Therapies: NA     Medication List    TAKE these medications     Indication   naproxen sodium 220 MG tablet  Commonly known as:  ANAPROX  Take 2 tablets (440 mg total) by mouth 2 (two) times daily with a meal. For pain   Indication:  Mild to Moderate Pain     traZODone 50 MG tablet  Commonly known as:  DESYREL  Take 1 tablet (50 mg total) by mouth at bedtime as needed for sleep or depression. For depression/sleep   Indication:  Trouble Sleeping, Major Depressive Disorder       Follow-up Information   Follow up with [facility] . ([facility] to [facility] not later than 10/27/12 between hours of 9AM and 1 PM. )    Contact information:   [address] [place], [place] [address] Ph [phone] FAX [phone]      Follow-up  recommendations:  Activity:  As tolerated Diet: As recommended by your primary care doctor. Keep all scheduled follow-up appointments as recommended.  Comments:  Take all your medications as prescribed by your mental healthcare provider. Report any adverse effects and or reactions from your medicines to your outpatient provider promptly. Patient is instructed and cautioned to not engage in alcohol and or illegal drug use while on prescription medicines. In the event of worsening symptoms, patient is instructed to call the crisis hotline, 911 and or go to the nearest ED for appropriate evaluation and treatment of symptoms. Follow-up with your primary care provider for your other medical issues, concerns and or health care needs.   Total Discharge Time:  Greater than 30 minutes.  Signed: Jacqulyn Cane, M.D.  10/21/2012 10:26 PM

## 2012-10-25 NOTE — Progress Notes (Signed)
Patient Discharge Instructions:  After Visit Summary (AVS):   Faxed to:  10/25/12 Discharge Summary Note:   Faxed to:  10/25/12 Psychiatric Admission Assessment Note:   Faxed to:  10/25/12 Suicide Risk Assessment - Discharge Assessment:   Faxed to:  10/25/12 Faxed/Sent to the Next Level Care provider:  10/25/12 Faxed to [facility] @ [phone]  Jerelene Redden, 10/25/2012, 4:07 PM

## 2013-02-16 ENCOUNTER — Emergency Department (HOSPITAL_COMMUNITY)
Admission: EM | Admit: 2013-02-16 | Discharge: 2013-02-16 | Disposition: A | Discharge status: Home / Self Care | Payer: Self-pay | Attending: Emergency Medicine | Admitting: Emergency Medicine

## 2013-02-16 ENCOUNTER — Encounter (HOSPITAL_COMMUNITY): Payer: Self-pay | Admitting: *Deleted

## 2013-02-16 DIAGNOSIS — F17200 Nicotine dependence, unspecified, uncomplicated: Secondary | ICD-10-CM | POA: Insufficient documentation

## 2013-02-16 DIAGNOSIS — F1910 Other psychoactive substance abuse, uncomplicated: Secondary | ICD-10-CM | POA: Insufficient documentation

## 2013-02-16 DIAGNOSIS — M545 Low back pain: Secondary | ICD-10-CM

## 2013-02-16 DIAGNOSIS — L255 Unspecified contact dermatitis due to plants, except food: Secondary | ICD-10-CM | POA: Insufficient documentation

## 2013-02-16 DIAGNOSIS — Z8659 Personal history of other mental and behavioral disorders: Secondary | ICD-10-CM | POA: Insufficient documentation

## 2013-02-16 DIAGNOSIS — Y929 Unspecified place or not applicable: Secondary | ICD-10-CM | POA: Insufficient documentation

## 2013-02-16 DIAGNOSIS — R21 Rash and other nonspecific skin eruption: Secondary | ICD-10-CM | POA: Insufficient documentation

## 2013-02-16 DIAGNOSIS — IMO0002 Reserved for concepts with insufficient information to code with codable children: Secondary | ICD-10-CM | POA: Insufficient documentation

## 2013-02-16 DIAGNOSIS — Y9389 Activity, other specified: Secondary | ICD-10-CM | POA: Insufficient documentation

## 2013-02-16 DIAGNOSIS — X503XXA Overexertion from repetitive movements, initial encounter: Secondary | ICD-10-CM | POA: Insufficient documentation

## 2013-02-16 MED ORDER — PREDNISONE 10 MG PO TABS: ORAL_TABLET | ORAL | Status: DC

## 2013-02-16 MED ORDER — HYDROCODONE-ACETAMINOPHEN 5-325 MG PO TABS: ORAL_TABLET | ORAL | Status: DC

## 2013-02-16 MED ORDER — CYCLOBENZAPRINE HCL 10 MG PO TABS: 10.0000 mg | ORAL_TABLET | Freq: Three times a day (TID) | ORAL | Status: DC | PRN

## 2013-02-16 NOTE — ED Provider Notes (Signed)
History    CSN: [id number] Arrival date & time 02/16/13  4098  First MD Initiated Contact with Patient 02/16/13 1001     Chief Complaint  Patient presents with  . Back Pain  . Rash   (Consider location/radiation/quality/duration/timing/severity/associated sxs/prior Treatment) HPI Comments: Ryan Kelley is a 27 y.o. male who presents to the Emergency Department complaining of low back pain that began after lifting a transmission.  Pain is worse with movement and improves with rest.  Describes as sharp pain that radiates across his back.  Pain not improved with Ibuprofen.  Patient also c/o rash and itching to his legs, arms and truck.  He noticed the rash after weed eating his yard and believes he was exposed to poison ivy. He has tried  Anti-itch medications and a "prescription cream" that someone gave him that has not helped.  He denies fever, swelling, drainage or chills    Patient is a 27 y.o. male presenting with back pain and rash. The history is provided by the patient.  Back Pain Location:  Lumbar spine Quality:  Aching Radiates to:  Does not radiate Pain severity:  Moderate Pain is:  Same all the time Onset quality:  Gradual Timing:  Constant Progression:  Worsening Context: lifting heavy objects, recent injury and twisting   Relieved by:  Nothing Worsened by:  Bending, twisting and movement Ineffective treatments:  Ibuprofen Associated symptoms: no abdominal pain, no abdominal swelling, no bladder incontinence, no bowel incontinence, no chest pain, no dysuria, no fever, no headaches, no leg pain, no numbness, no paresthesias, no pelvic pain, no perianal numbness, no tingling and no weakness   Rash Associated symptoms: no chest pain, no chills, no constipation, no dysuria, no fever, no hematuria, no shortness of breath, no sore throat and no vomiting    Past Medical History  Diagnosis Date  . Substance abuse   . PTSD (post-traumatic stress disorder)   . Attempted  suicide    History reviewed. No pertinent past surgical history. Family History  Problem Relation Age of Onset  . Diabetes Mother   . Cancer Mother   . Hypertension Mother   . Hypertension Father    History  Substance Use Topics  . Smoking status: Current Every Day Smoker -- 1.00 packs/day for 7 years    Types: Cigarettes  . Smokeless tobacco: Not on file  . Alcohol Use: 0.0 oz/week     Comment: once a week, occasional use    Review of Systems  Constitutional: Negative for fever, chills, activity change and appetite change.  HENT: Negative for sore throat, facial swelling, trouble swallowing, neck pain and neck stiffness.   Respiratory: Negative for chest tightness, shortness of breath and wheezing.   Cardiovascular: Negative for chest pain.  Gastrointestinal: Negative for vomiting, abdominal pain, constipation and bowel incontinence.  Genitourinary: Negative for bladder incontinence, dysuria, hematuria, flank pain, decreased urine volume, difficulty urinating and pelvic pain.       No perineal numbness or incontinence of urine or feces  Musculoskeletal: Positive for back pain. Negative for joint swelling.  Skin: Positive for rash. Negative for wound.  Neurological: Negative for dizziness, tingling, weakness, numbness, headaches and paresthesias.  Psychiatric/Behavioral: Negative for confusion.  All other systems reviewed and are negative.    Allergies  Review of patient's allergies indicates no known allergies.  Home Medications   Current Outpatient Rx  Name  Route  Sig  Dispense  Refill  . ibuprofen (ADVIL,MOTRIN) 200 MG tablet   Oral  Take 800 mg by mouth every 6 (six) hours as needed for pain.          BP 144/91  Pulse 75  Temp(Src) 98.5 F (36.9 C) (Oral)  Resp 16  Ht 5\' 5"  (1.651 m)  Wt 210 lb (95.255 kg)  BMI 34.95 kg/m2  SpO2 99% Physical Exam  Nursing note and vitals reviewed. Constitutional: He is oriented to person, place, and time. He appears  well-developed and well-nourished. No distress.  HENT:  Head: Normocephalic and atraumatic.  Neck: Normal range of motion. Neck supple.  Cardiovascular: Normal rate, regular rhythm, normal heart sounds and intact distal pulses.   No murmur heard. Pulmonary/Chest: Effort normal and breath sounds normal. No respiratory distress. He exhibits no tenderness.  Abdominal: Soft. He exhibits no distension. There is no tenderness. There is no rebound and no guarding.  Musculoskeletal: He exhibits tenderness. He exhibits no edema.       Lumbar back: He exhibits tenderness and pain. He exhibits normal range of motion, no bony tenderness, no swelling, no deformity, no laceration and normal pulse.       Back:  Diffuse ttp of the lumbar paraspinal muscles.  No spinal tenderness.  DP pulses are brisk and symmetrical.  Distal sensation intact.  Hip Flexors/Extensors are intact  Neurological: He is alert and oriented to person, place, and time. No cranial nerve deficit or sensory deficit. He exhibits normal muscle tone. Coordination and gait normal.  Reflex Scores:      Patellar reflexes are 2+ on the right side and 2+ on the left side.      Achilles reflexes are 2+ on the right side and 2+ on the left side. Skin: Skin is warm and dry. Rash noted.  Erythematous maculopapular rash to the bilateral LE's and trunk.  Small vesicles present.  No edema or drainage.      ED Course  Procedures (including critical care time) Labs Reviewed - No data to display   MDM    Patient has ttp of the bilateral lumbar paraspinal muscles.  No focal neuro deficits on exam.  Ambulates with a steady gait. Likely musculoskeletal injury. Rash appears c/w plant dermatitis.    Doubt emergent neurological or infectious process.  VSS.  Patient appears stable for discharge.  He agrees to ice , rest and close f/u with his PMD.     Roselynne Lortz L. Renny Gunnarson, PA-C 02/16/13 1028

## 2013-02-16 NOTE — ED Provider Notes (Signed)
Medical screening examination/treatment/procedure(s) were performed by non-physician practitioner and as supervising physician I was immediately available for consultation/collaboration.   Nyquan Selbe L Rommie Dunn, MD 02/16/13 1311 

## 2013-02-16 NOTE — ED Notes (Signed)
Rash all over body x 1 1/2 wks - back pain starting this morning.   Reports heavy lifting yesterday.

## 2013-05-28 ENCOUNTER — Encounter (HOSPITAL_COMMUNITY): Payer: Self-pay | Admitting: Emergency Medicine

## 2013-05-28 ENCOUNTER — Emergency Department (HOSPITAL_COMMUNITY)
Admission: EM | Admit: 2013-05-28 | Discharge: 2013-05-28 | Disposition: A | Discharge status: Home / Self Care | Payer: Self-pay | Attending: Emergency Medicine | Admitting: Emergency Medicine

## 2013-05-28 DIAGNOSIS — X500XXA Overexertion from strenuous movement or load, initial encounter: Secondary | ICD-10-CM | POA: Insufficient documentation

## 2013-05-28 DIAGNOSIS — Y9389 Activity, other specified: Secondary | ICD-10-CM | POA: Insufficient documentation

## 2013-05-28 DIAGNOSIS — F17200 Nicotine dependence, unspecified, uncomplicated: Secondary | ICD-10-CM | POA: Insufficient documentation

## 2013-05-28 DIAGNOSIS — L255 Unspecified contact dermatitis due to plants, except food: Secondary | ICD-10-CM | POA: Insufficient documentation

## 2013-05-28 DIAGNOSIS — S335XXA Sprain of ligaments of lumbar spine, initial encounter: Secondary | ICD-10-CM | POA: Insufficient documentation

## 2013-05-28 DIAGNOSIS — S39012A Strain of muscle, fascia and tendon of lower back, initial encounter: Secondary | ICD-10-CM

## 2013-05-28 DIAGNOSIS — Y929 Unspecified place or not applicable: Secondary | ICD-10-CM | POA: Insufficient documentation

## 2013-05-28 DIAGNOSIS — Z8659 Personal history of other mental and behavioral disorders: Secondary | ICD-10-CM | POA: Insufficient documentation

## 2013-05-28 MED ORDER — IBUPROFEN 800 MG PO TABS: 800.0000 mg | ORAL_TABLET | Freq: Three times a day (TID) | ORAL | Status: DC

## 2013-05-28 MED ORDER — CYCLOBENZAPRINE HCL 10 MG PO TABS: 10.0000 mg | ORAL_TABLET | Freq: Three times a day (TID) | ORAL | Status: DC | PRN

## 2013-05-28 MED ORDER — PREDNISONE 10 MG PO TABS: ORAL_TABLET | ORAL | Status: DC

## 2013-05-28 NOTE — ED Notes (Signed)
Pt states he was moving [place] all day yesterday. Woke this am with back pain. Pt c/o low back without radiation down legs.

## 2013-05-29 NOTE — ED Provider Notes (Signed)
CSN: [id number]     Arrival date & time 05/28/13  1208 History   First MD Initiated Contact with Patient 05/28/13 1235     Chief Complaint  Patient presents with  . Back Pain   (Consider location/radiation/quality/duration/timing/severity/associated sxs/prior Treatment) HPI Comments: Patient also c/o rash and itching to both arms for several days.  States that he has been exposed to poison oak while working outside.  Nothing makes the rash better or worse.  He denies swelling, blisters or difficulty swallowing or breathing.  Patient is a 27 y.o. male presenting with back pain. The history is provided by the patient.  Back Pain Location:  Lumbar spine Quality:  Aching Radiates to:  Does not radiate Pain severity:  Moderate Pain is:  Same all the time Onset quality:  Gradual Duration:  1 day Timing:  Constant Progression:  Unchanged Chronicity:  Recurrent Context: lifting heavy objects and twisting   Relieved by:  Nothing Worsened by:  Bending, twisting and standing Ineffective treatments:  None tried Associated symptoms: no abdominal pain, no abdominal swelling, no bladder incontinence, no bowel incontinence, no chest pain, no dysuria, no fever, no headaches, no leg pain, no numbness, no paresthesias, no pelvic pain, no perianal numbness, no tingling and no weakness     Past Medical History  Diagnosis Date  . Substance abuse   . PTSD (post-traumatic stress disorder)   . Attempted suicide    History reviewed. No pertinent past surgical history. Family History  Problem Relation Age of Onset  . Diabetes Mother   . Cancer Mother   . Hypertension Mother   . Hypertension Father    History  Substance Use Topics  . Smoking status: Current Every Day Smoker -- 1.00 packs/day for 7 years    Types: Cigarettes  . Smokeless tobacco: Not on file  . Alcohol Use: 0.0 oz/week     Comment: once a week, occasional use    Review of Systems  Constitutional: Negative for fever.   Respiratory: Negative for shortness of breath.   Cardiovascular: Negative for chest pain.  Gastrointestinal: Negative for vomiting, abdominal pain, constipation and bowel incontinence.  Genitourinary: Negative for bladder incontinence, dysuria, hematuria, flank pain, decreased urine volume, difficulty urinating and pelvic pain.       No perineal numbness or incontinence of urine or feces  Musculoskeletal: Positive for back pain. Negative for joint swelling.  Skin: Positive for rash.  Neurological: Negative for tingling, weakness, numbness, headaches and paresthesias.  All other systems reviewed and are negative.    Allergies  Review of patient's allergies indicates no known allergies.  Home Medications   Current Outpatient Rx  Name  Route  Sig  Dispense  Refill  . cyclobenzaprine (FLEXERIL) 10 MG tablet   Oral   Take 10 mg by mouth daily as needed for muscle spasms.         Marland Kitchen ibuprofen (ADVIL,MOTRIN) 800 MG tablet   Oral   Take 800 mg by mouth every 8 (eight) hours as needed for pain.         . cyclobenzaprine (FLEXERIL) 10 MG tablet   Oral   Take 1 tablet (10 mg total) by mouth 3 (three) times daily as needed.   21 tablet   0   . ibuprofen (ADVIL,MOTRIN) 800 MG tablet   Oral   Take 1 tablet (800 mg total) by mouth 3 (three) times daily.   21 tablet   0   . predniSONE (DELTASONE) 10 MG tablet  Take 6 tablets day one, 5 tablets day two, 4 tablets day three, 3 tablets day four, 2 tablets day five, then 1 tablet day six   21 tablet   0    BP 152/91  Pulse 69  Temp(Src) 98.5 F (36.9 C) (Oral)  Resp 20  Ht 5\' 5"  (1.651 m)  Wt 200 lb (90.719 kg)  BMI 33.28 kg/m2  SpO2 100% Physical Exam  Nursing note and vitals reviewed. Constitutional: He is oriented to person, place, and time. He appears well-developed and well-nourished. No distress.  HENT:  Head: Normocephalic and atraumatic.  Mouth/Throat: Oropharynx is clear and moist.  Neck: Normal range of  motion. Neck supple.  Cardiovascular: Normal rate, regular rhythm, normal heart sounds and intact distal pulses.   No murmur heard. Pulmonary/Chest: Effort normal and breath sounds normal. No respiratory distress. He has no wheezes.  Abdominal: Soft. He exhibits no distension. There is no tenderness.  Musculoskeletal: He exhibits tenderness. He exhibits no edema.       Lumbar back: He exhibits tenderness and pain. He exhibits normal range of motion, no swelling, no deformity, no laceration and normal pulse.  ttp of the lumbar paraspinal muscles.  No spinal tenderness.  DP pulses are brisk and symmetrical.  Distal sensation intact.  Hip Flexors/Extensors are intact  Neurological: He is alert and oriented to person, place, and time. He has normal strength. No sensory deficit. He exhibits normal muscle tone. Coordination and gait normal.  Reflex Scores:      Patellar reflexes are 2+ on the right side and 2+ on the left side.      Achilles reflexes are 2+ on the right side and 2+ on the left side. Skin: Skin is warm and dry. Rash noted.  Scattered erythematous vesicles to the dorsal hands and bilateral UE's.  No drainage, pustules or grouped vesicles.     ED Course  Procedures (including critical care time) Labs Review Labs Reviewed - No data to display Imaging Review No results found.  EKG Interpretation   None       MDM   1. Lumbar strain, initial encounter   2. Plant dermatitis     No concerning sx's for emergent neurological or infectious process.  Pt ambulates with a steady gait.  No focal neuro deficits.  Agrees to f/u with his PMD if needed. Pt appears stable for discharge   Inocencia Murtaugh L. Trisha Mangle, PA-C 05/29/13 2104

## 2013-05-30 ENCOUNTER — Encounter (HOSPITAL_COMMUNITY): Payer: Self-pay | Admitting: Emergency Medicine

## 2013-05-30 ENCOUNTER — Emergency Department (HOSPITAL_COMMUNITY)
Admission: EM | Admit: 2013-05-30 | Discharge: 2013-05-30 | Disposition: A | Discharge status: Home / Self Care | Payer: Self-pay | Attending: Emergency Medicine | Admitting: Emergency Medicine

## 2013-05-30 ENCOUNTER — Emergency Department (HOSPITAL_COMMUNITY): Payer: Self-pay

## 2013-05-30 DIAGNOSIS — M545 Low back pain, unspecified: Secondary | ICD-10-CM

## 2013-05-30 DIAGNOSIS — F17200 Nicotine dependence, unspecified, uncomplicated: Secondary | ICD-10-CM | POA: Insufficient documentation

## 2013-05-30 DIAGNOSIS — R059 Cough, unspecified: Secondary | ICD-10-CM

## 2013-05-30 DIAGNOSIS — R05 Cough: Secondary | ICD-10-CM | POA: Insufficient documentation

## 2013-05-30 DIAGNOSIS — Z8659 Personal history of other mental and behavioral disorders: Secondary | ICD-10-CM | POA: Insufficient documentation

## 2013-05-30 DIAGNOSIS — Z791 Long term (current) use of non-steroidal anti-inflammatories (NSAID): Secondary | ICD-10-CM | POA: Insufficient documentation

## 2013-05-30 MED ORDER — HYDROCODONE-ACETAMINOPHEN 5-325 MG PO TABS: 1.0000 | ORAL_TABLET | ORAL | Status: DC | PRN

## 2013-05-30 NOTE — ED Notes (Signed)
Low back pain, seen here on Monday for same, Now says he has a cough and hurts his back when he coughs,  No relief from meds  Given here.

## 2013-05-30 NOTE — ED Notes (Signed)
nad noted prior to dc. Dc instructions reviewed with pt and explained. 1 script given. Pt voiced understanding.

## 2013-05-30 NOTE — ED Provider Notes (Signed)
CSN: [id number]     Arrival date & time 05/30/13  1247 History   First MD Initiated Contact with Patient 05/30/13 1330     Chief Complaint  Patient presents with  . Back Pain   (Consider location/radiation/quality/duration/timing/severity/associated sxs/prior Treatment) HPI Comments: 27 year old male presents with low back pain over the past 3 days. 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 The pain is all over across his lower back. It is a severe pain. He denies any leg weakness, numbness, or bowel or bladder incontinence. He did not specifically remember an injury. He's also had a cough slightly worse than normal over this time. He is a smoker but has not made any attempt to quit. Denies any dyspnea or wheezing. Denies any fever or other URI symptoms. Seen here in 2 days ago and started on Flexeril, Motrin, and prednisone which have not helped. He's tried icing his back which has not helped. He tried one of his mom's hydrocodone as which helped relieve the pain.   Past Medical History  Diagnosis Date  . Substance abuse   . PTSD (post-traumatic stress disorder)   . Attempted suicide    History reviewed. No pertinent past surgical history. Family History  Problem Relation Age of Onset  . Diabetes Mother   . Cancer Mother   . Hypertension Mother   . Hypertension Father    History  Substance Use Topics  . Smoking status: Current Every Day Smoker -- 1.00 packs/day for 7 years    Types: Cigarettes  . Smokeless tobacco: Not on file  . Alcohol Use: 0.0 oz/week     Comment: once a week, occasional use    Review of Systems  Constitutional: Negative for fever and chills.  Respiratory: Positive for cough. Negative for shortness of breath.   Cardiovascular: Negative for chest pain.  Gastrointestinal: Negative for vomiting and abdominal pain.  Musculoskeletal: Positive for back pain.  Neurological: Negative for weakness and numbness.  All other  systems reviewed and are negative.    Allergies  Review of patient's allergies indicates no known allergies.  Home Medications   Current Outpatient Rx  Name  Route  Sig  Dispense  Refill  . cyclobenzaprine (FLEXERIL) 10 MG tablet   Oral   Take 1 tablet (10 mg total) by mouth 3 (three) times daily as needed.   21 tablet   0   . ibuprofen (ADVIL,MOTRIN) 800 MG tablet   Oral   Take 1 tablet (800 mg total) by mouth 3 (three) times daily.   21 tablet   0   . predniSONE (DELTASONE) 10 MG tablet      Take 6 tablets day one, 5 tablets day two, 4 tablets day three, 3 tablets day four, 2 tablets day five, then 1 tablet day six   21 tablet   0   . HYDROcodone-acetaminophen (NORCO) 5-325 MG per tablet   Oral   Take 1 tablet by mouth every 4 (four) hours as needed for pain.   10 tablet   0    BP 151/77  Pulse 68  Temp(Src) 98.8 F (37.1 C) (Oral)  Resp 18  Ht 5\' 5"  (1.651 m)  SpO2 97% Physical Exam  Nursing note and vitals reviewed. Constitutional: He is oriented to person, place, and time. He appears well-developed and well-nourished.  HENT:  Head: Normocephalic and atraumatic.  Right Ear: External ear normal.  Left Ear: External ear normal.  Nose: Nose normal.  Eyes: Right eye exhibits no discharge. Left eye exhibits no discharge.  Neck: Neck supple.  Cardiovascular: Normal rate, regular rhythm, normal heart sounds and intact distal pulses.   Pulmonary/Chest: Effort normal and breath sounds normal. He has no wheezes. He has no rales.  Abdominal: Soft. There is no tenderness.  Musculoskeletal: He exhibits no edema.       Lumbar back: He exhibits tenderness.  Neurological: He is alert and oriented to person, place, and time. He has normal strength. No sensory deficit.  Reflex Scores:      Patellar reflexes are 2+ on the right side and 2+ on the left side.      Achilles reflexes are 2+ on the right side and 2+ on the left side. 5/5 strength in lower extremities  bilaterally  Skin: Skin is warm and dry.    ED Course  Procedures (including critical care time) Labs Review Labs Reviewed - No data to display Imaging Review No results found.  EKG Interpretation   None       MDM   1. Low back pain   2. Cough    No red flags for his back pain on history or exam. Normal neurologic function. Denies any bowel or bladder incontinence. I feel that this is a musculoskeletal etiology. We'll give a short course of narcotics and discussed that is important that he actually followup with his PCP. His respiratory exam is normal, and is not have any fevers, shortness of breath, or production with his cough. The cough is more likely related to smoking or a URI. At this point I do not feel like he needs a chest x-ray or any other imaging.    Audree Camel, MD 05/30/13 1500

## 2013-06-05 NOTE — ED Provider Notes (Signed)
Medical screening examination/treatment/procedure(s) were performed by non-physician practitioner and as supervising physician I was immediately available for consultation/collaboration.  EKG Interpretation   None        Nusrat Encarnacion, MD 06/05/13 1730 

## 2013-11-19 ENCOUNTER — Emergency Department: Payer: Self-pay | Admitting: Emergency Medicine

## 2013-11-19 LAB — URINALYSIS, COMPLETE
BILIRUBIN, UR: NEGATIVE
Bacteria: NONE SEEN
Blood: NEGATIVE
GLUCOSE, UR: NEGATIVE mg/dL (ref 0–75)
Ketone: NEGATIVE
Leukocyte Esterase: NEGATIVE
Nitrite: NEGATIVE
Ph: 6 (ref 4.5–8.0)
Protein: NEGATIVE
RBC,UR: NONE SEEN /HPF (ref 0–5)
SQUAMOUS EPITHELIAL: NONE SEEN
Specific Gravity: 1.026 (ref 1.003–1.030)
WBC UR: NONE SEEN /HPF (ref 0–5)

## 2013-12-04 ENCOUNTER — Encounter (HOSPITAL_COMMUNITY): Payer: Self-pay | Admitting: Emergency Medicine

## 2013-12-04 ENCOUNTER — Emergency Department (HOSPITAL_COMMUNITY)
Admission: EM | Admit: 2013-12-04 | Discharge: 2013-12-05 | Disposition: A | Discharge status: Other Acute Inpatient Hospital | Payer: Self-pay | Attending: Emergency Medicine | Admitting: Emergency Medicine

## 2013-12-04 DIAGNOSIS — F1110 Opioid abuse, uncomplicated: Secondary | ICD-10-CM | POA: Insufficient documentation

## 2013-12-04 DIAGNOSIS — Z79899 Other long term (current) drug therapy: Secondary | ICD-10-CM | POA: Insufficient documentation

## 2013-12-04 DIAGNOSIS — F1510 Other stimulant abuse, uncomplicated: Secondary | ICD-10-CM | POA: Insufficient documentation

## 2013-12-04 DIAGNOSIS — F1410 Cocaine abuse, uncomplicated: Secondary | ICD-10-CM | POA: Insufficient documentation

## 2013-12-04 DIAGNOSIS — F17200 Nicotine dependence, unspecified, uncomplicated: Secondary | ICD-10-CM | POA: Insufficient documentation

## 2013-12-04 DIAGNOSIS — F1910 Other psychoactive substance abuse, uncomplicated: Secondary | ICD-10-CM

## 2013-12-04 DIAGNOSIS — R45851 Suicidal ideations: Secondary | ICD-10-CM | POA: Insufficient documentation

## 2013-12-04 DIAGNOSIS — F1310 Sedative, hypnotic or anxiolytic abuse, uncomplicated: Secondary | ICD-10-CM | POA: Insufficient documentation

## 2013-12-04 DIAGNOSIS — F1210 Cannabis abuse, uncomplicated: Secondary | ICD-10-CM | POA: Insufficient documentation

## 2013-12-04 LAB — ACETAMINOPHEN LEVEL: Acetaminophen (Tylenol), Serum: <15 ug/mL (ref 10–30)

## 2013-12-04 LAB — COMPREHENSIVE METABOLIC PANEL
ALK PHOS: 81 U/L (ref 39–117)
ALT: 50 U/L (ref 0–53)
AST: 34 U/L (ref 0–37)
Albumin: 4.4 g/dL (ref 3.5–5.2)
BILIRUBIN TOTAL: 0.4 mg/dL (ref 0.3–1.2)
BUN: 8 mg/dL (ref 6–23)
CALCIUM: 9.8 mg/dL (ref 8.4–10.5)
CHLORIDE: 100 meq/L (ref 96–112)
CO2: 28 meq/L (ref 19–32)
Creatinine, Ser: 0.88 mg/dL (ref 0.50–1.35)
GLUCOSE: 93 mg/dL (ref 70–99)
Potassium: 4.7 mEq/L (ref 3.7–5.3)
SODIUM: 140 meq/L (ref 137–147)
Total Protein: 7.5 g/dL (ref 6.0–8.3)

## 2013-12-04 LAB — RAPID URINE DRUG SCREEN, HOSP PERFORMED
Amphetamines: POSITIVE — AB
BARBITURATES: NOT DETECTED
BENZODIAZEPINES: POSITIVE — AB
COCAINE: NOT DETECTED
OPIATES: POSITIVE — AB
Tetrahydrocannabinol: POSITIVE — AB

## 2013-12-04 LAB — CBC WITH DIFFERENTIAL/PLATELET
BASOS ABS: 0 10*3/uL (ref 0.0–0.1)
BASOS PCT: 1 % (ref 0–1)
EOS ABS: 0.1 10*3/uL (ref 0.0–0.7)
Eosinophils Relative: 1 % (ref 0–5)
HCT: 46.7 % (ref 39.0–52.0)
HEMOGLOBIN: 16.3 g/dL (ref 13.0–17.0)
Lymphocytes Relative: 32 % (ref 12–46)
Lymphs Abs: 2.6 10*3/uL (ref 0.7–4.0)
MCH: 31.5 pg (ref 26.0–34.0)
MCHC: 34.9 g/dL (ref 30.0–36.0)
MCV: 90.3 fL (ref 78.0–100.0)
MONOS PCT: 8 % (ref 3–12)
Monocytes Absolute: 0.6 10*3/uL (ref 0.1–1.0)
NEUTROS ABS: 4.6 10*3/uL (ref 1.7–7.7)
NEUTROS PCT: 58 % (ref 43–77)
PLATELETS: 289 10*3/uL (ref 150–400)
RBC: 5.17 MIL/uL (ref 4.22–5.81)
RDW: 12.8 % (ref 11.5–15.5)
WBC: 7.9 10*3/uL (ref 4.0–10.5)

## 2013-12-04 LAB — ETHANOL: Alcohol, Ethyl (B): <11 mg/dL (ref 0–11)

## 2013-12-04 NOTE — ED Notes (Signed)
Telepsych consult in progress

## 2013-12-04 NOTE — ED Notes (Signed)
[facility] [address], but no beds available at this time, possible rooms available tomorrow. Pt is being referred to other facilities.

## 2013-12-04 NOTE — BH Assessment (Addendum)
Tele Assessment Note   Ryan Kelley is an 28 y.o. male. Writer spoke w/ EDP Zackowski re: pt's presentation. Pt presents voluntarily to [facility] endorsing SI with plan to overdose and he requests detox from opiates, benzos, amphetamines, and THC. Pt is cooperative and polite. He reports he attempted suicide three weeks ago by overdosing on Tylenol 3. Pt sts he didn't seek medical attention afterwards. [facility] - in March [facility] for MDD and SA and in 2006 after his brother died from accidental OD on methadone. Pt sts he witnessed the death of his brother in 2006 and suffers from PTSD as a result. Pt currently endorses SI but denies intent or plan. Pt reports abusing following substances: 1) oxycodone & hydrocodone - last use 12/03/13 2) Xanax - last use 11/30/13 3) Vyvanse - last use 12/03/13 and 4) THC - last use 12/04/13. Pt sts his mom goes to [facility] and pt wants a referral to that agency. He endorses "depressed and agitated" mood. Affect is depressed. He endorses mood swings. Pt reports occasional insomnia, fatigue, guilt, loss of interest in usual pleasures, worthlessness and hopelessness. [facility] denies [facility] and no delusions noted. Pt denies HI. He reports poor concentration and impaired remote and recent memory. Pt denies hx of seizures. Pt has never been in [employer] [facility] [employer]. Pt sts he hasn't seen a psychiatrist in several yrs.  Writer ran pt by Claudette Headonrad Withrow NP who [facility] [address] [facility]. Writer notified Dr. Deretha EmoryZackowski of pt's disposition.   Axis I: Opioid Use Disorder, Severe           Anxiolytic Use Disorder, Severe           Cannabis Use Disorder, Moderate           Substance Induced Mood Disorder Axis II: Deferred Axis III:  Past Medical History  Diagnosis Date  . Substance abuse   . PTSD (post-traumatic stress disorder)   . Attempted suicide    Axis IV: economic problems, housing problems, other psychosocial or environmental problems and problems related to  social environment Axis V: 31-40 impairment in reality testing  Past Medical History:  Past Medical History  Diagnosis Date  . Substance abuse   . PTSD (post-traumatic stress disorder)   . Attempted suicide     History reviewed. No pertinent past surgical history.  Family History:  Family History  Problem Relation Age of Onset  . Diabetes Mother   . Cancer Mother   . Hypertension Mother   . Hypertension Father     Social History:  reports that he has been smoking Cigarettes.  He has a 7 pack-year smoking history. He does not have any smokeless tobacco history on file. He reports that he drinks alcohol. He reports that he uses illicit drugs (Marijuana, Hydrocodone, Oxycodone, and Benzodiazepines) about 5 times per week.  Additional Social History:  Alcohol / Drug Use Pain Medications: pt reports abuse of pain pills he buys off the street Prescriptions: pt reports abuse of xanax and vyvanse he buys off street Over the Counter: none History of alcohol / drug use?: Yes Substance #1 Name of Substance 1: pain pills - hydrocodone & oxycodone - swallows pills 1 - Age of First Use: 11 1 - Amount (size/oz): as much as he can afford  1 - Frequency: daily 1 - Duration: been using heavily since 2006 1 - Last Use / Amount: 12/03/13 Substance #2 Name of Substance 2: xanax  2 - Age of First Use: 10 2 - Amount (size/oz): "two blue pills" 2 - Frequency: daily for past week 2 - Duration: off and on over the years 2 - Last Use / Amount: 11/30/13 Substance #3 Name of Substance 3: THC 3 - Age of First Use: 10 3 - Amount (size/oz): 0.5 to 2.0 grams 3 - Frequency: daily 3 - Duration: years 3 - Last Use / Amount: 12/03/13 - one bowl Substance #4 Name of Substance 4: vyvanse 4 - Age of First Use: 27 4 - Amount (size/oz): 50 mg 4 - Frequency: daily 4 - Duration: daily for past two weeks 4 - Last Use / Amount: 12/03/13  CIWA: CIWA-Ar BP: 147/91 mmHg Pulse Rate: 77 COWS:    Allergies:  No Known Allergies  Home Medications:  (Not in a hospital admission)  OB/GYN Status:  No LMP for male patient.  General Assessment Data Location of Assessment: [facility] Is this a Tele or Face-to-Face Assessment?: Tele Assessment Is this an Initial Assessment or a Re-assessment for this encounter?: Initial Assessment Living Arrangements: Other relatives;Other (Comment) (aunt & two teenage male cousins) Can pt return to current living arrangement?: Yes Admission Status: Voluntary Is patient capable of signing voluntary admission?: Yes Transfer from: Home Referral Source: Self/Family/Friend     [facility] Living Arrangements: Other relatives;Other (Comment) (aunt & two teenage male cousins) Name of Psychiatrist: none Name of Therapist: none  Education Status Is patient currently in school?: No  Risk to self Suicidal Ideation: Yes-Currently Present Suicidal Intent: No Is patient at risk for suicide?: Yes Suicidal Plan?: No Access to Means: Yes Specify Access to Suicidal Means: pills What has been your use of drugs/alcohol within the last 12 months?: frequent use of thc, vyvanse, opiates & xanax Previous Attempts/Gestures: Yes How many times?: 1 (3 weeks ago overdose attempt on Tylenol 3) Other Self Harm Risks: none Triggers for Past Attempts: Other (Comment) (depression) Intentional Self Injurious Behavior: None Family Suicide History: No Recent stressful life event(s): Other (Comment) (depressive symptoms & substance abuse) Persecutory voices/beliefs?: No Depression: Yes Depression Symptoms: Despondent;Insomnia;Fatigue;Guilt;Loss of interest in usual pleasures;Feeling worthless/self pity Substance abuse history and/or treatment for substance abuse?: No Suicide prevention information given to non-admitted patients: Not applicable  Risk to Others Homicidal Ideation: No Thoughts of Harm to Others: No Current Homicidal Intent: No Current Homicidal Plan: No Access  to Homicidal Means: No Identified Victim: none History of harm to others?: No Assessment of Violence: None Noted Violent Behavior Description: pt calm and denies hx violence Does patient have access to weapons?: No Criminal Charges Pending?: No Does patient have a court date: No  Psychosis Hallucinations: None noted Delusions: None noted  Mental Status Report Appear/Hygiene: Other (Comment) (appropriate) Eye Contact: Good Motor Activity: Freedom of movement Speech: Logical/coherent Level of Consciousness: Alert;Quiet/awake Mood: Depressed;Sad;Anhedonia Affect: Appropriate to circumstance;Depressed;Sad Anxiety Level: None Thought Processes: Coherent;Relevant Judgement: Unimpaired Orientation: Person;Place;Time;Situation Obsessive Compulsive Thoughts/Behaviors: None  Cognitive Functioning Concentration: Decreased Memory: Remote Impaired;Recent Impaired IQ: Average Insight: Fair Impulse Control: Poor Appetite: Good Sleep: Decreased Total Hours of Sleep: 5 Vegetative Symptoms: None  ADLScreening [facility]) Patient's cognitive ability adequate to safely complete daily activities?: Yes Patient able to express need for assistance with ADLs?: Yes Independently performs ADLs?: Yes (appropriate for developmental age)  Prior Inpatient Therapy Prior Inpatient Therapy: Yes Prior Therapy Dates: 2006 & 2014 [facility] Reason for Treatment: MDD, SA  Prior Outpatient Therapy Prior Outpatient Therapy: Yes Prior Therapy Dates:  two years ago Prior Therapy Facilty/Provider(s): Dr. Carroll Sage at [facility] Reason for Treatment: med management  ADL Screening (condition at time of admission) Patient's cognitive ability adequate to safely complete daily activities?: Yes Is the patient deaf or have difficulty hearing?: No Does the patient have difficulty seeing, even when wearing glasses/contacts?: No Does the patient have difficulty  concentrating, remembering, or making decisions?: Yes Patient able to express need for assistance with ADLs?: Yes Does the patient have difficulty dressing or bathing?: No Independently performs ADLs?: Yes (appropriate for developmental age) Does the patient have difficulty walking or climbing stairs?: No Weakness of Legs: None Weakness of Arms/Hands: None       Abuse/Neglect Assessment (Assessment to be complete while patient is alone) Physical Abuse: Denies Verbal Abuse: Denies Sexual Abuse: Denies Exploitation of patient/patient's resources: Denies Self-Neglect: Denies Values / Beliefs Cultural Requests During Hospitalization: None Spiritual Requests During Hospitalization: None   Advance Directives (For Healthcare) Advance Directive: Patient does not have advance directive;Patient would not like information    Additional Information 1:1 In Past 12 Months?: No CIRT Risk: No Elopement Risk: No Does patient have medical clearance?: Yes     Disposition:  Disposition Initial Assessment Completed for this Encounter: Yes Disposition of Patient: Inpatient treatment program Type of inpatient treatment program: Adult (pt accepted to [address] by Claudette Head NP)  Thornell Sartorius 12/04/2013 12:54 PM

## 2013-12-04 NOTE — ED Provider Notes (Signed)
CSN: [id number]     Arrival date & time 12/04/13  [id number] History  This chart was scribed for Ryan JakesScott W. Braydn Carneiro, MD by Leone PayorSonum Patel, ED Scribe. This patient was seen in room APA17/APA17 and the patient's care was started 8:52 AM.      Chief Complaint  Patient presents with  . V70.1      The history is provided by the patient. No language interpreter was used.   HPI Comments: Ryan Kelley is a 28 y.o. male who presents to the Emergency Department complaining of continued SI that began over several weeks ago. He reports a suicide attempt that occurred 2 weeks ago by taking Tylenol no 3. Patient states he also has a narcotic pain medication addiction. He has been seen in the past by behavioral health but denies recent follow up.    Past Medical History  Diagnosis Date  . Substance abuse   . PTSD (post-traumatic stress disorder)   . Attempted suicide    History reviewed. No pertinent past surgical history. Family History  Problem Relation Age of Onset  . Diabetes Mother   . Cancer Mother   . Hypertension Mother   . Hypertension Father    History  Substance Use Topics  . Smoking status: Current Every Day Smoker -- 1.00 packs/day for 7 years    Types: Cigarettes  . Smokeless tobacco: Not on file  . Alcohol Use: 0.0 oz/week     Comment: once a week, occasional use    Review of Systems  Constitutional: Negative for fever and chills.  HENT: Negative for rhinorrhea and sore throat.   Eyes: Negative for visual disturbance.  Respiratory: Negative for cough and shortness of breath.   Cardiovascular: Negative for chest pain and leg swelling.  Gastrointestinal: Negative for abdominal pain.  Genitourinary: Negative for dysuria.  Musculoskeletal: Negative for back pain and neck pain.  Skin: Negative for rash.  Neurological: Negative for headaches.  Hematological: Does not bruise/bleed easily.  Psychiatric/Behavioral: Positive for suicidal ideas. Negative for confusion.       Allergies  Review of patient's allergies indicates no known allergies.  Home Medications   Prior to Admission medications   Medication Sig Start Date End Date Taking? Authorizing Provider  cyclobenzaprine (FLEXERIL) 10 MG tablet Take 1 tablet (10 mg total) by mouth 3 (three) times daily as needed. 05/28/13   Tammy L. Triplett, PA-C  HYDROcodone-acetaminophen (NORCO) 5-325 MG per tablet Take 1 tablet by mouth every 4 (four) hours as needed for pain. 05/30/13   Audree CamelScott T Goldston, MD  ibuprofen (ADVIL,MOTRIN) 800 MG tablet Take 1 tablet (800 mg total) by mouth 3 (three) times daily. 05/28/13   Tammy L. Triplett, PA-C  predniSONE (DELTASONE) 10 MG tablet Take 6 tablets day one, 5 tablets day two, 4 tablets day three, 3 tablets day four, 2 tablets day five, then 1 tablet day six 05/28/13   Tammy L. Triplett, PA-C   BP 130/63  Pulse 75  Temp(Src) 97.5 F (36.4 C) (Oral)  Resp 18  Ht 5\' 5"  (1.651 m)  Wt 200 lb (90.719 kg)  BMI 33.28 kg/m2  SpO2 100% Physical Exam  Nursing note and vitals reviewed. Constitutional: He is oriented to person, place, and time. He appears well-developed and well-nourished.  HENT:  Head: Atraumatic.  Mouth/Throat: Mucous membranes are normal. Mucous membranes are not dry.  Eyes: EOM are normal. No scleral icterus.  No scleral icterus.   Cardiovascular: Normal rate, regular rhythm and normal heart sounds.  Pulmonary/Chest: Effort normal and breath sounds normal.  Abdominal: Soft. Bowel sounds are normal. He exhibits no distension. There is no tenderness.  Musculoskeletal: Normal range of motion. He exhibits no edema.  Neurological: He is alert and oriented to person, place, and time.  Skin: Skin is warm and dry.  Psychiatric: He has a normal mood and affect.    ED Course  Procedures (including critical care time)  DIAGNOSTIC STUDIES: Oxygen Saturation is 100% on RA, normal by my interpretation.    COORDINATION OF CARE: 9:08 AM Discussed  treatment plan with pt at bedside and pt agreed to plan.   Labs Review Labs Reviewed  URINE RAPID DRUG SCREEN (HOSP PERFORMED) - Abnormal; Notable for the following:    Opiates POSITIVE (*)    Benzodiazepines POSITIVE (*)    Amphetamines POSITIVE (*)    Tetrahydrocannabinol POSITIVE (*)    All other components within normal limits  COMPREHENSIVE METABOLIC PANEL  CBC WITH DIFFERENTIAL  ETHANOL  ACETAMINOPHEN LEVEL   Results for orders placed during the hospital encounter of 12/04/13  COMPREHENSIVE METABOLIC PANEL      Result Value Ref Range   Sodium 140  137 - 147 mEq/L   Potassium 4.7  3.7 - 5.3 mEq/L   Chloride 100  96 - 112 mEq/L   CO2 28  19 - 32 mEq/L   Glucose, Bld 93  70 - 99 mg/dL   BUN 8  6 - 23 mg/dL   Creatinine, Ser 1.61  0.50 - 1.35 mg/dL   Calcium 9.8  8.4 - 09.6 mg/dL   Total Protein 7.5  6.0 - 8.3 g/dL   Albumin 4.4  3.5 - 5.2 g/dL   AST 34  0 - 37 U/L   ALT 50  0 - 53 U/L   Alkaline Phosphatase 81  39 - 117 U/L   Total Bilirubin 0.4  0.3 - 1.2 mg/dL   GFR calc non Af Amer >90  >90 mL/min   GFR calc Af Amer >90  >90 mL/min  CBC WITH DIFFERENTIAL      Result Value Ref Range   WBC 7.9  4.0 - 10.5 K/uL   RBC 5.17  4.22 - 5.81 MIL/uL   Hemoglobin 16.3  13.0 - 17.0 g/dL   HCT 04.5  40.9 - 81.1 %   MCV 90.3  78.0 - 100.0 fL   MCH 31.5  26.0 - 34.0 pg   MCHC 34.9  30.0 - 36.0 g/dL   RDW 91.4  78.2 - 95.6 %   Platelets 289  150 - 400 K/uL   Neutrophils Relative % 58  43 - 77 %   Neutro Abs 4.6  1.7 - 7.7 K/uL   Lymphocytes Relative 32  12 - 46 %   Lymphs Abs 2.6  0.7 - 4.0 K/uL   Monocytes Relative 8  3 - 12 %   Monocytes Absolute 0.6  0.1 - 1.0 K/uL   Eosinophils Relative 1  0 - 5 %   Eosinophils Absolute 0.1  0.0 - 0.7 K/uL   Basophils Relative 1  0 - 1 %   Basophils Absolute 0.0  0.0 - 0.1 K/uL  URINE RAPID DRUG SCREEN (HOSP PERFORMED)      Result Value Ref Range   Opiates POSITIVE (*) NONE DETECTED   Cocaine NONE DETECTED  NONE DETECTED    Benzodiazepines POSITIVE (*) NONE DETECTED   Amphetamines POSITIVE (*) NONE DETECTED   Tetrahydrocannabinol POSITIVE (*) NONE DETECTED   Barbiturates NONE DETECTED  NONE DETECTED  ETHANOL      Result Value Ref Range   Alcohol, Ethyl (B) <11  0 - 11 mg/dL  ACETAMINOPHEN LEVEL      Result Value Ref Range   Acetaminophen (Tylenol), Serum <15.0  10 - 30 ug/mL     Imaging Review No results found.   EKG Interpretation None      MDM   Final diagnoses:  Substance abuse  Suicidal ideation    Patient cleared medically. Patient interviewed by [facility]. They do want to admit for both of suicidal ideation and the substance abuse. No beds currently available they will contact us from beds available. Patient gave a history of significant Tylenol No. 3 overdose 2 weeks ago of her liver function test are normal no evidence of any hepatic failure or injury.    I personally performed the services described in this documentation, which was scribed in my presence. The recorded information has been reviewed and is accurate.    Ryan JakesScott W. Amy Belloso, MD 12/04/13 [phone]

## 2013-12-04 NOTE — ED Notes (Signed)
Pt states he is suicidal today with a plan. States he would take a bunch of pills. Pt states [facility] but he manipulated then so he could get out early

## 2013-12-05 ENCOUNTER — Inpatient Hospital Stay (HOSPITAL_COMMUNITY)
Admission: AD | Admit: 2013-12-05 | Discharge: 2013-12-08 | DRG: 897 | Disposition: A | Discharge status: Home / Self Care | Payer: Federal, State, Local not specified - Other | Source: Intra-hospital | Attending: Psychiatry | Admitting: Psychiatry

## 2013-12-05 ENCOUNTER — Encounter (HOSPITAL_COMMUNITY): Payer: Self-pay

## 2013-12-05 DIAGNOSIS — F1994 Other psychoactive substance use, unspecified with psychoactive substance-induced mood disorder: Secondary | ICD-10-CM | POA: Diagnosis present

## 2013-12-05 DIAGNOSIS — F1920 Other psychoactive substance dependence, uncomplicated: Secondary | ICD-10-CM | POA: Diagnosis present

## 2013-12-05 DIAGNOSIS — F1210 Cannabis abuse, uncomplicated: Secondary | ICD-10-CM | POA: Diagnosis present

## 2013-12-05 DIAGNOSIS — Z8249 Family history of ischemic heart disease and other diseases of the circulatory system: Secondary | ICD-10-CM

## 2013-12-05 DIAGNOSIS — R45851 Suicidal ideations: Secondary | ICD-10-CM

## 2013-12-05 DIAGNOSIS — F4310 Post-traumatic stress disorder, unspecified: Secondary | ICD-10-CM | POA: Diagnosis present

## 2013-12-05 DIAGNOSIS — F411 Generalized anxiety disorder: Secondary | ICD-10-CM | POA: Diagnosis present

## 2013-12-05 DIAGNOSIS — F321 Major depressive disorder, single episode, moderate: Secondary | ICD-10-CM | POA: Diagnosis present

## 2013-12-05 DIAGNOSIS — Z833 Family history of diabetes mellitus: Secondary | ICD-10-CM

## 2013-12-05 DIAGNOSIS — G4700 Insomnia, unspecified: Secondary | ICD-10-CM | POA: Diagnosis present

## 2013-12-05 DIAGNOSIS — F17200 Nicotine dependence, unspecified, uncomplicated: Secondary | ICD-10-CM | POA: Diagnosis present

## 2013-12-05 DIAGNOSIS — F1120 Opioid dependence, uncomplicated: Principal | ICD-10-CM | POA: Diagnosis present

## 2013-12-05 DIAGNOSIS — F410 Panic disorder [episodic paroxysmal anxiety] without agoraphobia: Secondary | ICD-10-CM | POA: Diagnosis present

## 2013-12-05 HISTORY — DX: Anxiety disorder, unspecified: F41.9

## 2013-12-05 MED ORDER — DIPHENHYDRAMINE HCL 25 MG PO CAPS
25.0000 mg | ORAL_CAPSULE | Freq: Once | ORAL | Status: AC
  Administered 2013-12-05: 25 mg via ORAL

## 2013-12-05 MED ORDER — DIPHENHYDRAMINE HCL 25 MG PO CAPS
ORAL_CAPSULE | ORAL | Status: AC
  Filled 2013-12-05: qty 1

## 2013-12-05 MED ORDER — ALUM & MAG HYDROXIDE-SIMETH 200-200-20 MG/5ML PO SUSP: 30.0000 mL | ORAL | Status: DC | PRN

## 2013-12-05 MED ORDER — CLONIDINE HCL 0.1 MG PO TABS: 0.1000 mg | ORAL_TABLET | Freq: Every day | ORAL | Status: DC

## 2013-12-05 MED ORDER — TRAZODONE HCL 50 MG PO TABS
50.0000 mg | ORAL_TABLET | Freq: Every evening | ORAL | Status: DC | PRN
  Administered 2013-12-05 – 2013-12-07 (×3): 50 mg via ORAL
  Filled 2013-12-05 (×9): qty 1

## 2013-12-05 MED ORDER — CLONIDINE HCL 0.1 MG PO TABS
0.1000 mg | ORAL_TABLET | ORAL | Status: DC
  Filled 2013-12-05 (×2): qty 1

## 2013-12-05 MED ORDER — MAGNESIUM HYDROXIDE 400 MG/5ML PO SUSP: 30.0000 mL | Freq: Every day | ORAL | Status: DC | PRN

## 2013-12-05 MED ORDER — ACETAMINOPHEN 325 MG PO TABS: 650.0000 mg | ORAL_TABLET | Freq: Four times a day (QID) | ORAL | Status: DC | PRN

## 2013-12-05 MED ORDER — ONDANSETRON 4 MG PO TBDP: 4.0000 mg | ORAL_TABLET | Freq: Four times a day (QID) | ORAL | Status: DC | PRN

## 2013-12-05 MED ORDER — METHOCARBAMOL 500 MG PO TABS
500.0000 mg | ORAL_TABLET | Freq: Three times a day (TID) | ORAL | Status: DC | PRN
Start: 2013-12-05 — End: 2013-12-08

## 2013-12-05 MED ORDER — CLONIDINE HCL 0.1 MG PO TABS
0.1000 mg | ORAL_TABLET | Freq: Four times a day (QID) | ORAL | Status: DC
  Administered 2013-12-05 – 2013-12-08 (×6): 0.1 mg via ORAL
  Filled 2013-12-05 (×11): qty 1

## 2013-12-05 MED ORDER — CHLORDIAZEPOXIDE HCL 25 MG PO CAPS: 25.0000 mg | ORAL_CAPSULE | Freq: Four times a day (QID) | ORAL | Status: DC | PRN

## 2013-12-05 MED ORDER — NAPROXEN 500 MG PO TABS: 500.0000 mg | ORAL_TABLET | Freq: Two times a day (BID) | ORAL | Status: DC | PRN

## 2013-12-05 MED ORDER — HYDROXYZINE HCL 25 MG PO TABS
25.0000 mg | ORAL_TABLET | Freq: Four times a day (QID) | ORAL | Status: DC | PRN
  Administered 2013-12-05: 25 mg via ORAL
  Filled 2013-12-05: qty 1

## 2013-12-05 MED ORDER — DICYCLOMINE HCL 20 MG PO TABS: 20.0000 mg | ORAL_TABLET | Freq: Four times a day (QID) | ORAL | Status: DC | PRN

## 2013-12-05 MED ORDER — LOPERAMIDE HCL 2 MG PO CAPS: 2.0000 mg | ORAL_CAPSULE | ORAL | Status: DC | PRN

## 2013-12-05 NOTE — Progress Notes (Signed)
Patient ID: Ryan Kelley, male   DOB: 10/01/1985, 28 y.o.   MRN: [id number] Pt is a [age]yr old male that came in with SI to OD and substance abuse. Pt stated he uses oxycodone, hydrocodone, and xanax. Pt stated his last use was 3 days ago. Pt stated his drug use isn't that serious due to the fact he cannot afford that many pills. Pt stated he has been having racing thoughts lately and having mood swings. Pt stated he will be feeling really happy one minute and the next depressed. Pt stated he has been feeling this way for the last year. Pt is currently living with his aunt and upon d/c wants to go back to his aunts house and have a referral to [facility] in [place]. denies si/hi/avh at the moment denies pain. Pt introduced to unit. Meal offered. Pt remains safe on unit.

## 2013-12-05 NOTE — ED Notes (Signed)
Pt wants something to help him sleep EDP notified.  

## 2013-12-05 NOTE — ED Notes (Signed)
Lunch tray given to pt. [facility]itter at [facility]. nad noted.

## 2013-12-05 NOTE — ED Notes (Signed)
[facility] called will be here 45 min appox for transfer.

## 2013-12-05 NOTE — Tx Team (Signed)
Initial Interdisciplinary Treatment Plan  PATIENT STRENGTHS: (choose at least two) Ability for insight Communication skills Motivation for treatment/growth Supportive family/friends  PATIENT STRESSORS: Financial difficulties Substance abuse   PROBLEM LIST: Problem List/Patient Goals Date to be addressed Date deferred Reason deferred Estimated date of resolution  Substance abuse 12/05/13     unemployed 12/05/13     Financial stressor 12/05/13                                          DISCHARGE CRITERIA:  Ability to meet basic life and health needs Adequate post-discharge living arrangements Improved stabilization in mood, thinking, and/or behavior Withdrawal symptoms are absent or subacute and managed without 24-hour nursing intervention  PRELIMINARY DISCHARGE PLAN: Attend aftercare/continuing care group Attend PHP/IOP Attend 12-step recovery group Return to previous living arrangement  PATIENT/FAMIILY INVOLVEMENT: This treatment plan has been presented to and reviewed with the patient, Rayann HemanSean J Mcadory, .  The patient and family have been given the opportunity to ask questions and make suggestions.  Kellie MoorKristina M Aislynn Cifelli 12/05/2013, 8:35 PM

## 2013-12-05 NOTE — ED Notes (Signed)
Pt requesting shower- male Engineer, materialssecurity officer called to help with assistance to shower and to have sitter with pt as well.

## 2013-12-05 NOTE — ED Notes (Signed)
[facility] at [facility] aware of [facility] transport.

## 2013-12-05 NOTE — ED Notes (Signed)
Dr. Lynelle DoctorKnapp aware of transfer and accepting MD.

## 2013-12-05 NOTE — ED Notes (Signed)
Pt resting in bed - waiting EMTALA for transfer to facility sitter at [facility].

## 2013-12-05 NOTE — ED Notes (Signed)
Called [facility] to check on status. No discharges on floor pt will be going to.

## 2013-12-05 NOTE — ED Notes (Signed)
Pt has bed on 307-2 with Dr. Dub MikesLugo. Pt bed will be available at 1630.

## 2013-12-05 NOTE — ED Notes (Signed)
[facility] here to transport pt; pt belongings given to driver; pt verified all belongings are his; [facility] and informed [facility] is on the way to their facility.

## 2013-12-06 DIAGNOSIS — F1920 Other psychoactive substance dependence, uncomplicated: Secondary | ICD-10-CM | POA: Diagnosis present

## 2013-12-06 DIAGNOSIS — F1910 Other psychoactive substance abuse, uncomplicated: Secondary | ICD-10-CM

## 2013-12-06 DIAGNOSIS — F1994 Other psychoactive substance use, unspecified with psychoactive substance-induced mood disorder: Secondary | ICD-10-CM

## 2013-12-06 DIAGNOSIS — F411 Generalized anxiety disorder: Secondary | ICD-10-CM

## 2013-12-06 NOTE — Progress Notes (Signed)
D: Pt denies SI/HI/AVH. Pt is pleasant and cooperative. Pt says everything going good, pt wants to really follow up with [facility] upon D/C.   A: Pt was offered support and encouragement. Pt was given scheduled medications. Pt was encourage to attend groups. Q 15 minute checks were done for safety.   R:Pt attends groups and interacts well with peers and staff. Pt is taking medication. Pt has no complaints at this time.Pt receptive to treatment and safety maintained on unit.

## 2013-12-06 NOTE — [facility] Counselor (Signed)
Adult Comprehensive Assessment  Patient ID: Ryan Kelley, male   DOB: [birth year], 28 y.o.   MRN: [id number]  Information Source: Information source: Patient  Current Stressors:  Employment / Job issues: unemployed Surveyor, quantityinancial / Lack of resources (include bankruptcy): no income, depedent on family right now Substance abuse: opiate and marijuana abuse Bereavement / Loss: break up with girlfriend  Living/Environment/Situation:  Living Arrangements: Other relatives Living conditions (as described by patient or guardian): Pt lives with aunt in [place] but states that [place] at [place].   How long has patient lived in current situation?: a few months What is atmosphere in current home: Supportive;Loving;Comfortable  Family History:  Marital status: Single Does patient have children?: No  Childhood History:  By whom was/is the patient raised?: Mother;Father Additional childhood history information: Pt states that he had a good childhood.  Description of patient's relationship with caregiver when they were a child: Pt reports getting along well with parents growing up.  Patient's description of current relationship with people who raised him/her: Pt reports still getting along well with parents today.   Does patient have siblings?: Yes Number of Siblings: 1 Description of patient's current relationship with siblings: Brother is deceased Did patient suffer any verbal/emotional/physical/sexual abuse as a child?: No Did patient suffer from severe childhood neglect?: No Has patient ever been sexually abused/assaulted/raped as an adolescent or adult?: No Was the patient ever a victim of a crime or a disaster?: No Witnessed domestic violence?: No Has patient been effected by domestic violence as an adult?: No  Education:  Highest grade of school patient has completed: some college Currently a Consulting civil engineerstudent?: No Learning disability?: No  Employment/Work  Situation:   Employment situation: Unemployed Patient's job has been impacted by current illness: No What is the longest time patient has a held a job?: 3 years Where was the patient employed at that time?: [employer] Has patient ever been in the [employer] and Companymilitary?: No Has patient ever served in Buyer, retailcombat?: No  Financial Resources:   Surveyor, quantityinancial resources: No income Does patient have a Lawyerrepresentative payee or guardian?: No  Alcohol/Substance Abuse:   What has been your use of drugs/alcohol within the last 12 months?: Oxcodone and Hydrocodone - 25 mg pills daily for the last few months, Marijuana - 2 grams daily for the last few months If attempted suicide, did drugs/alcohol play a role in this?: No Alcohol/Substance Abuse Treatment Hx: Denies past history If yes, describe treatment: N/A Has alcohol/substance abuse ever caused legal problems?: No  Social Support System:   Patient's Community Support System: Good Describe Community Support System: Pt reports family is supportive Type of faith/religion: Christian How does patient's faith help to cope with current illness?: prayer  Leisure/Recreation:   Leisure and Hobbies: art - drawing and painting  Strengths/Needs:   What things does the patient do well?: drawing In what areas does patient struggle / problems for patient: Depression, anxiety, SI, substance abuse  Discharge Plan:   Does patient have access to transportation?: Yes Will patient be returning to same living situation after discharge?: Yes Currently receiving community mental health services: No If no, would patient like referral for services when discharged?: Yes (What county?) [facility] Does patient have financial barriers related to discharge medications?: No  Summary/Recommendations:     Patient is a 28 year old Caucasian Male with a diagnosis of Opioid Use Disorder, Severe, Anxiolytic Use Disorder, Severe, Cannabis Use Disorder, Moderate, Substance Induced Mood  Disorder.  [place] in [place] with his mother.  Pt states that he has been depressed and suicidal for some time now.  Pt reports his stressors are the loss of his job and break up with girlfriend.  Pt reports heavy substance abuse as well.  Patient will benefit from crisis stabilization, medication evaluation, group therapy and psycho education in addition to case management for discharge planning.    Sharman Garrott N Horton. 12/06/2013

## 2013-12-06 NOTE — Progress Notes (Signed)
0900 nursing orientation group  The focus of this group is to educate the patient on the purpose and policies of crisis stabilization and provide a format to answer questions about their admission.  The group details unit policies and expectations of patients while admitted.   Pt did not attend. 

## 2013-12-06 NOTE — [facility] Group Notes (Signed)
[facility] LCSW Group Therapy  12/06/2013  1:15 PM   Type of Therapy:  Group Therapy  Participation Level:  Active  Participation Quality:  Attentive, Sharing and Supportive  Affect:  Depressed and Flat  Cognitive:  Alert and Oriented  Insight:  Developing/Improving and Engaged  Engagement in Therapy:  Developing/Improving and Engaged  Modes of Intervention:  Clarification, Confrontation, Discussion, Education, Exploration, Limit-setting, Orientation, Problem-solving, Rapport Building, Dance movement psychotherapisteality Testing, Socialization and Support  Summary of Progress/Problems: The topic for group was balance in life.  Today's group focused on defining balance in one's own words, identifying things that can knock one off balance, and exploring healthy ways to maintain balance in life. Group members were asked to provide an example of a time when they felt off balance, describe how they handled that situation,and process healthier ways to regain balance in the future. Group members were asked to share the most important tool for maintaining balance that they learned [facility] and how they plan to apply this method after discharge.  Pt shared that his mental health, family, finding employment and faith are his priorities right now.  Pt states that he feels he has put his mental health first right now by bringing himself here and asking for help.  Pt was open with sharing what brought him to the hospital, discussing his substance use and depression.  Pt actively participated and was engaged in group discussion.    Ryan IvanChelsea Horton, LCSW 12/06/2013  2:24 PM

## 2013-12-06 NOTE — H&P (Signed)
Psychiatric Admission Assessment Adult  Patient Identification:  Ryan Kelley Date of Evaluation:  12/06/2013 Chief Complaint:  SUBSTANCE DEPENDENCE History of Present Illness:: 28 Y/O male who after he left here last year,he  lost his job,( main reason was staying home as he was sick from drugs) then broke up with girlfriend. Admits to  Increased stress. States he had racing thoughts, ideas coming up rapidly to his mind. States that this has been going on for a year. States he gets depressed. States he is using 25 mg of opioids every day. States that before he came he was thinking about hurting himself. States things were going good but he started using again ( 9 months ago or so). He tried to OD 3 months ago with Tylenol 3. Did not pursue help then. Has also been using Benzodiazepines, Vyvance ( like cocaine) marijuana. He is increasingly more depressed, feeling very overwhelmed decided to come for help  Associated Signs/Synptoms: Depression Symptoms:  depressed mood, anhedonia, insomnia, fatigue, difficulty concentrating, suicidal thoughts without plan, anxiety, panic attacks, insomnia, loss of energy/fatigue, disturbed sleep, (Hypo) Manic Symptoms:  Impulsivity, Irritable Mood, Labiality of Mood, Anxiety Symptoms:  Excessive Worry, Panic Symptoms, Psychotic Symptoms:  Denies PTSD Symptoms: Had a traumatic exposure:  saw brother die from OD Re-experiencing:  Intrusive Thoughts, Dreams Total Time spent with patient: 45 minutes  Psychiatric Specialty Exam: Physical Exam  Review of Systems  Constitutional: Positive for malaise/fatigue.  HENT: Negative.   Eyes: Negative.   Respiratory:       Pack a a day  Cardiovascular: Negative.   Gastrointestinal: Negative.   Genitourinary: Negative.   Musculoskeletal: Negative.   Skin: Negative.   Neurological: Negative.   Endo/Heme/Allergies: Negative.   Psychiatric/Behavioral: Positive for depression and substance abuse. The patient  is nervous/anxious and has insomnia.     Blood pressure 115/83, pulse 85, temperature 97.9 F (36.6 C), temperature source Oral, resp. rate 16, height '5\' 5"'  (1.651 m), weight 90.719 kg (200 lb).Body mass index is 33.28 kg/(m^2).  General Appearance: Fairly Groomed  Engineer, water::  Fair  Speech:  Clear and Coherent and not spontaneous  Volume:  Decreased  Mood:  Anxious, Depressed and worried  Affect:  Restricted  Thought Process:  Coherent and Goal Directed  Orientation:  Full (Time, Place, and Person)  Thought Content:  symptoms, worries, concerns  Suicidal Thoughts:  No  Homicidal Thoughts:  No  Memory:  Immediate;   Fair Recent;   Fair Remote;   Fair  Judgement:  Fair  Insight:  Shallow  Psychomotor Activity:  Restlessness  Concentration:  Fair  Recall:  [employer] of Knowledge:NA  Language: Fair  Akathisia:  No  Handed:    AIMS (if indicated):     Assets:  Desire for Improvement Housing  Sleep:  Number of Hours: 6.5    Musculoskeletal: Strength & Muscle Tone: within normal limits Gait & Station: normal Patient leans: N/A  Past Psychiatric History: Diagnosis:  Hospitalizations: [facility]  Outpatient Care: [facility]  Substance Abuse Care: Denies  Self-Mutilation: Denies  Suicidal Attempts:Denies  Violent Behaviors:Denies   Past Medical History:   Past Medical History  Diagnosis Date  . Substance abuse   . PTSD (post-traumatic stress disorder)   . Attempted suicide   . Anxiety     Allergies:  No Known Allergies PTA Medications: No prescriptions prior to admission    Previous Psychotropic Medications:  Medication/Dose  Was supposed to take Celexa, Trazodone until prescription ran out  Was prescribed Valium for panic 5 mg PRN----#30 ( has some left)           Substance Abuse History in the last 12 months:  yes  Consequences of Substance Abuse: Withdrawal Symptoms:   Cramps Diaphoresis Diarrhea Headaches Nausea Tremors Vomiting aches,  pains  Social History:  reports that he has been smoking Cigarettes.  He has a 7 pack-year smoking history. He does not have any smokeless tobacco history on file. He reports that he drinks alcohol. He reports that he uses illicit drugs (Marijuana, Hydrocodone, Oxycodone, and Benzodiazepines) about 5 times per week. Additional Social History: Pain Medications: pt reports abuse of pain pills he buys off the street Prescriptions: pt reports abuse of xanax and vyvanse he buys off street Over the Counter: none History of alcohol / drug use?: Yes Negative Consequences of Use: Financial;Personal relationships Name of Substance 1: pain pills - hydrocodone & oxycodone - swallows pills 1 - Age of First Use: 11 1 - Amount (size/oz): as much as he can afford  1 - Frequency: daily 1 - Duration: been using heavily since 2006 1 - Last Use / Amount: 12/03/13 Name of Substance 2: xanax  2 - Age of First Use: 10 2 - Amount (size/oz): "two blue pills" 2 - Frequency: daily for past week 2 - Duration: off and on over the years 2 - Last Use / Amount: 11/30/13 Name of Substance 3: THC 3 - Age of First Use: 10 3 - Amount (size/oz): 0.5 to 2.0 grams 3 - Frequency: daily 3 - Duration: years 3 - Last Use / Amount: 12/03/13 - one bowl Name of Substance 4: vyvanse 4 - Age of First Use: 27 4 - Amount (size/oz): 50 mg 4 - Frequency: daily 4 - Duration: daily for past two weeks 4 - Last Use / Amount: 12/03/13            Current Place of Residence:  Staying with aunt Place of Birth:   Family Members: Marital Status:  Single Children: Denies  Sons:  Daughters: Relationships: Education:  some college Automotive engineer and communication) Educational Problems/Performance: Religious Beliefs/Practices: [employer] History of Abuse (Emotional/Phsycial/Sexual) Denies Pensions consultant; Architect work Nature conservation officer History:  None. Legal History: Denies Hobbies/Interests:  Family History:   Family History   Problem Relation Age of Onset  . Diabetes Mother   . Cancer Mother   . Hypertension Mother   . Hypertension Father   Father alcohol, Mother and Aunt Depression and anxiety  Results for orders placed during the hospital encounter of 12/04/13 (from the past 72 hour(s))  COMPREHENSIVE METABOLIC PANEL     Status: None   Collection Time    12/04/13  8:54 AM      Result Value Ref Range   Sodium 140  137 - 147 mEq/L   Potassium 4.7  3.7 - 5.3 mEq/L   Chloride 100  96 - 112 mEq/L   CO2 28  19 - 32 mEq/L   Glucose, Bld 93  70 - 99 mg/dL   BUN 8  6 - 23 mg/dL   Creatinine, Ser 0.88  0.50 - 1.35 mg/dL   Calcium 9.8  8.4 - 10.5 mg/dL   Total Protein 7.5  6.0 - 8.3 g/dL   Albumin 4.4  3.5 - 5.2 g/dL   AST 34  0 - 37 U/L   ALT 50  0 - 53 U/L   Alkaline Phosphatase 81  39 - 117 U/L   Total Bilirubin 0.4  0.3 -  1.2 mg/dL   GFR calc non Af Amer >90  >90 mL/min   GFR calc Af Amer >90  >90 mL/min   Comment: (NOTE)     The eGFR has been calculated using the CKD EPI equation.     This calculation has not been validated in all clinical situations.     eGFR's persistently <90 mL/min signify possible Chronic Kidney     Disease.  CBC WITH DIFFERENTIAL     Status: None   Collection Time    12/04/13  8:54 AM      Result Value Ref Range   WBC 7.9  4.0 - 10.5 K/uL   RBC 5.17  4.22 - 5.81 MIL/uL   Hemoglobin 16.3  13.0 - 17.0 g/dL   HCT 46.7  39.0 - 52.0 %   MCV 90.3  78.0 - 100.0 fL   MCH 31.5  26.0 - 34.0 pg   MCHC 34.9  30.0 - 36.0 g/dL   RDW 12.8  11.5 - 15.5 %   Platelets 289  150 - 400 K/uL   Neutrophils Relative % 58  43 - 77 %   Neutro Abs 4.6  1.7 - 7.7 K/uL   Lymphocytes Relative 32  12 - 46 %   Lymphs Abs 2.6  0.7 - 4.0 K/uL   Monocytes Relative 8  3 - 12 %   Monocytes Absolute 0.6  0.1 - 1.0 K/uL   Eosinophils Relative 1  0 - 5 %   Eosinophils Absolute 0.1  0.0 - 0.7 K/uL   Basophils Relative 1  0 - 1 %   Basophils Absolute 0.0  0.0 - 0.1 K/uL  ETHANOL     Status: None    Collection Time    12/04/13  8:54 AM      Result Value Ref Range   Alcohol, Ethyl (B) <11  0 - 11 mg/dL   Comment:            LOWEST DETECTABLE LIMIT FOR     SERUM ALCOHOL IS 11 mg/dL     FOR MEDICAL PURPOSES ONLY  ACETAMINOPHEN LEVEL     Status: None   Collection Time    12/04/13  8:54 AM      Result Value Ref Range   Acetaminophen (Tylenol), Serum <15.0  10 - 30 ug/mL   Comment:            THERAPEUTIC CONCENTRATIONS VARY     SIGNIFICANTLY. A RANGE OF 10-30     ug/mL MAY BE AN EFFECTIVE     CONCENTRATION FOR MANY PATIENTS.     HOWEVER, SOME ARE BEST TREATED     AT CONCENTRATIONS OUTSIDE THIS     RANGE.     ACETAMINOPHEN CONCENTRATIONS     >150 ug/mL AT 4 HOURS AFTER     INGESTION AND >50 ug/mL AT 12     HOURS AFTER INGESTION ARE     OFTEN ASSOCIATED WITH TOXIC     REACTIONS.  URINE RAPID DRUG SCREEN (HOSP PERFORMED)     Status: Abnormal   Collection Time    12/04/13  9:03 AM      Result Value Ref Range   Opiates POSITIVE (*) NONE DETECTED   Cocaine NONE DETECTED  NONE DETECTED   Benzodiazepines POSITIVE (*) NONE DETECTED   Amphetamines POSITIVE (*) NONE DETECTED   Tetrahydrocannabinol POSITIVE (*) NONE DETECTED   Barbiturates NONE DETECTED  NONE DETECTED   Comment:  DRUG SCREEN FOR MEDICAL PURPOSES     ONLY.  IF CONFIRMATION IS NEEDED     FOR ANY PURPOSE, NOTIFY LAB     WITHIN 5 DAYS.                LOWEST DETECTABLE LIMITS     FOR URINE DRUG SCREEN     Drug Class       Cutoff (ng/mL)     Amphetamine      1000     Barbiturate      200     Benzodiazepine   614     Tricyclics       431     Opiates          300     Cocaine          300     THC              50   Psychological Evaluations:  Assessment:   DSM5:  Schizophrenia Disorders:  none Obsessive-Compulsive Disorders:  none Trauma-Stressor Disorders:  Posttraumatic Stress Disorder (309.81) Substance/Addictive Disorders:  Cannabis Use Disorder - Moderate 9304.30) and Opioid Disorder - Severe  (304.00), amphetamine related disorder Depressive Disorders:  Major Depressive Disorder - Moderate (296.22)  AXIS I:  Anxiety Disorder NOS and Substance Induced Mood Disorder AXIS II:  Deferred AXIS III:   Past Medical History  Diagnosis Date  . Substance abuse   . PTSD (post-traumatic stress disorder)   . Attempted suicide   . Anxiety    AXIS IV:  other psychosocial or environmental problems AXIS V:  41-50 serious symptoms  Treatment Plan/Recommendations:   Supportive approach/coping skills/relapse prevention                                                                  Clonidine detox protocol                                                                  Reassess and address the co morbidities  Treatment Plan Summary: Daily contact with patient to assess and evaluate symptoms and progress in treatment Medication management Current Medications:  Current Facility-Administered Medications  Medication Dose Route Frequency Provider Last Rate Last Dose  . acetaminophen (TYLENOL) tablet 650 mg  650 mg Oral Q6H PRN Laverle Hobby, PA-C      . alum & mag hydroxide-simeth (MAALOX/MYLANTA) 200-200-20 MG/5ML suspension 30 mL  30 mL Oral Q4H PRN Laverle Hobby, PA-C      . chlordiazePOXIDE (LIBRIUM) capsule 25 mg  25 mg Oral QID PRN Laverle Hobby, PA-C      . cloNIDine (CATAPRES) tablet 0.1 mg  0.1 mg Oral QID Laverle Hobby, PA-C   0.1 mg at 12/06/13 0847   Followed by  . [START ON 12/08/2013] cloNIDine (CATAPRES) tablet 0.1 mg  0.1 mg Oral BH-qamhs Spencer E Simon, PA-C       Followed by  . [START ON 12/11/2013] cloNIDine (CATAPRES) tablet 0.1 mg  0.1 mg Oral QAC breakfast Frederico Hamman  E Simon, PA-C      . dicyclomine (BENTYL) tablet 20 mg  20 mg Oral Q6H PRN Laverle Hobby, PA-C      . hydrOXYzine (ATARAX/VISTARIL) tablet 25 mg  25 mg Oral Q6H PRN Laverle Hobby, PA-C   25 mg at 12/05/13 2146  . loperamide (IMODIUM) capsule 2-4 mg  2-4 mg Oral PRN Laverle Hobby, PA-C      .  magnesium hydroxide (MILK OF MAGNESIA) suspension 30 mL  30 mL Oral Daily PRN Laverle Hobby, PA-C      . methocarbamol (ROBAXIN) tablet 500 mg  500 mg Oral Q8H PRN Laverle Hobby, PA-C      . naproxen (NAPROSYN) tablet 500 mg  500 mg Oral BID PRN Laverle Hobby, PA-C      . ondansetron (ZOFRAN-ODT) disintegrating tablet 4 mg  4 mg Oral Q6H PRN Laverle Hobby, PA-C      . traZODone (DESYREL) tablet 50 mg  50 mg Oral QHS,MR X 1 Laverle Hobby, PA-C   50 mg at 12/05/13 2146    Observation Level/Precautions:  15 minute checks  Laboratory:  As per the ED  Psychotherapy:  Individual/group  Medications:  Clonidine Detox  Consultations:    Discharge Concerns:  Need for rehab  Estimated LOS: 3-5 days  Other:     I certify that inpatient services furnished can reasonably be expected to improve the patient's condition.   Nicholaus Bloom 4/30/20158:54 AM

## 2013-12-06 NOTE — Progress Notes (Signed)
Patient did attend the evening karaoke group. Pt was attentive and engaged but did not participate by singing a song.

## 2013-12-06 NOTE — Progress Notes (Signed)
Pt has been up and active in the milieu today.  He rated his depression a 3 denies any hopelessness and rated his anxiety a 5 on his self-inventory.  He denies any S/H ideation or A/V/H.  He plans to f/u with [facility].  Pt has not required any prn medications.

## 2013-12-07 MED ORDER — BUPROPION HCL ER (XL) 150 MG PO TB24: 150.0000 mg | ORAL_TABLET | Freq: Every day | ORAL | Status: DC

## 2013-12-07 MED ORDER — BUPROPION HCL ER (XL) 150 MG PO TB24
150.0000 mg | ORAL_TABLET | Freq: Every day | ORAL | Status: DC
  Administered 2013-12-07 – 2013-12-08 (×2): 150 mg via ORAL
  Filled 2013-12-07 (×4): qty 1

## 2013-12-07 NOTE — [facility] Group Notes (Signed)
Adult Psychoeducational Group Note  Date:  12/07/2013 Time:  10:56 PM  Group Topic/Focus:  AA Meeting  Participation Level:  Minimal  Participation Quality:  Appropriate and Attentive  Affect:  Flat  Cognitive:  Appropriate  Insight: Appropriate  Engagement in Group:  Limited  Modes of Intervention:  Discussion and Education  Additional Comments:  Gregary SignsSean attended group.  Hiroshi Krummel A Lillia AbedLindsay 12/07/2013, 10:56 PM

## 2013-12-07 NOTE — [facility] Group Notes (Signed)
[facility] LCSW Group Therapy  12/07/2013 3:16 PM  Type of Therapy:  Group Therapy  Participation Level:  Minimal  Participation Quality:  Attentive  Affect:  Appropriate  Cognitive:  Alert and Oriented  Insight:  Improving  Engagement in Therapy:  Improving  Modes of Intervention:  Confrontation, Discussion, Education, Exploration, Problem-solving, Rapport Building, Socialization and Support  Summary of Progress/Problems: Feelings around Relapse. Group members discussed the meaning of relapse and shared personal stories of relapse, how it affected them and others, and how they perceived themselves during this time. Group members were encouraged to identify triggers, warning signs and coping skills used when facing the possibility of relapse. Social supports were discussed and explored in detail. Post Acute Withdrawal Syndrome (handout provided) was introduced and examined. Pt's were encouraged to ask questions, talk about key points associated with PAWS, and process this information in terms of relapse prevention. Ryan Kelley was attentive throughout today's therapy group. He actively listened as others shared in the group setting but did not actively participate in group discussion unless prompted by CSW. Ryan Kelley shared that he plans to inform his family and support network about PAWS and was able to acknowledge the importance of having a safety plan in place before experiencing these negative symptoms. Ryan Kelley shows some progress in the group setting at this time.    Ryan Kelley Smart LCSWA 12/07/2013, 3:16 PM

## 2013-12-07 NOTE — [facility] Group Notes (Signed)
[facility]   12/07/2013 10:49 AM  Participation Quality:  Appropriate   Mood/Affect:  Flat  Depression Rating:  5  Anxiety Rating:  5  Thoughts of Suicide:  No Will you contract for safety?   NA  Current AVH:  No  Plan for Discharge/Comments:  Pt reports no withdrawals today and good sleep. Pt hoping for d/c today. Per MD, pt can d/c Sunday. [facility].   Transportation Means: family   Supports: parents/ family supports   Teaching laboratory technicianHeather Smart LCSWA

## 2013-12-07 NOTE — Progress Notes (Signed)
Adult Psychoeducational Group Note  Date:  12/07/2013 Time:  1:20 PM  Group Topic/Focus:  Early Warning Signs:   The focus of this group is to help patients identify signs or symptoms they exhibit before slipping into an unhealthy state or crisis.  Participation Level:  Active  Participation Quality:  Appropriate, Attentive and Supportive  Affect:  Appropriate  Cognitive:  Appropriate  Insight: Appropriate  Engagement in Group:  Engaged and Supportive  Modes of Intervention:  Discussion and Support  Additional Comments:  Pts dicussed early warning signs that they notice themselves going through when they begin to slip into an unhealthy state.  Pt identified his warning signs as dwelling on the past and racing thoughts. Pt left early and did not return.  Caswell CorwinDana C Mylon Mabey 12/07/2013, 1:20 PM

## 2013-12-07 NOTE — Progress Notes (Addendum)
[facility] :  Will you be returning to the same living situation after discharge: Yes,  home At discharge, do you have transportation home?: Yes, family will pick him up on Saturday.  Do you have the ability to pay for your medications:Yes,  mental health  Release of information consent forms completed and submitted to Medical Records by CSW.  Patient to Follow up at: Follow-up Information   Follow up with [facility] On 12/13/2013. (Appt. on 3:00PM with Ms. Young for hospital followup and diagnostic assessment for therapy services. Please arrive 15 minutes early to complete new patient paperwork. )    Contact information:   [address] [place], [place] [address] Phone: [phone] Fax: [phone]      Patient denies SI/HI:   Yes,  during group/self report.    Safety Planning and Suicide Prevention discussed:  Yes,  SPE completed with pt's mother. SPI pamphlet provided to pt and he was encouraged to share information with support network, ask questions, and talk about any concerns relating to SPE.   Neeka Urista Smart LCSWA  12/07/2013, 12:11 PM

## 2013-12-07 NOTE — Progress Notes (Signed)
D: Patient denies SI/HI and A/V hallucinations; patient reports no withdrawal symptoms and denies all pain and denies all physical symptoms at this time   A: Monitored q 15 minutes; patient encouraged to attend groups; patient educated about medications; patient given medications per physician orders; patient encouraged to express feelings and/or concerns  R: Patient requested a 72 hour discharge and signed it 0930hrs on 12/07/13 and Dr. Dub MikesLugo made aware at treatment team; patient does not present with any symptoms; patient is refusing clonidine; patient's interaction with staff and peers is appropriate; patient was able to set goal to talk with staff 1:1 when having feelings of SI; patient did take his wellbutrin

## 2013-12-07 NOTE — [facility] Suicide Risk Assessment (Signed)
[facility] INPATIENT:  Family/Significant Other Suicide Prevention Education  Suicide Prevention Education:  Education Completed; Janyth ContesVirginia Russey - mother [phone]),  (name of family member/significant other) has been identified by the patient as the family member/significant other with whom the patient will be residing, and identified as the person(s) who will aid the patient in the event of a mental health crisis (suicidal ideations/suicide attempt).  With written consent from the patient, the family member/significant other has been provided the following suicide prevention education, prior to the and/or following the discharge of the patient.  The suicide prevention education provided includes the following:  Suicide risk factors  Suicide prevention and interventions  National Suicide Hotline telephone number  [facility] assessment telephone number  [facility] Emergency Assistance 911  [facility] and/or [facility]idential Mobile Crisis Unit telephone number  Request made of family/significant other to:  Remove weapons (e.g., guns, rifles, knives), all items previously/currently identified as safety concern.    Remove drugs/medications (over-the-counter, prescriptions, illicit drugs), all items previously/currently identified as a safety concern.  The family member/significant other verbalizes understanding of the suicide prevention education information provided.  The family member/significant other agrees to remove the items of safety concern listed above.  Lulubelle Simcoe N Horton 12/07/2013, 10:40 AM

## 2013-12-07 NOTE — Tx Team (Signed)
Interdisciplinary Treatment Plan Update (Adult)  Date: 12/07/2013   Time Reviewed: ,12:05 PM  Progress in Treatment:  Attending groups: Yes  Participating in groups:  Minimally  Taking medication as prescribed: Yes  Tolerating medication: Yes  Family/Significant othe contact made: SPE completed with pt's mother.  Patient understands diagnosis: Yes, AEB seeking treatment for Opioid detox,benzo detox, mood stabilization, SI, and med management.  Discussing patient identified problems/goals with staff: Yes  Medical problems stabilized or resolved: Yes  Denies suicidal/homicidal ideation: Yes during group/self report.  Patient has not harmed self or Others: Yes  New problem(s) identified:  Discharge Plan or Barriers: [facility]. He plans to return home at d/c where he lives with his parents.  Additional comments: 28 Y/O male who after he left here last year,he lost his job,( main reason was staying home as he was sick from drugs) then broke up with girlfriend. Admits to Increased stress. States he had racing thoughts, ideas coming up rapidly to his mind. States that this has been going on for a year. States he gets depressed. States he is using 25 mg of opioids every day. States that before he came he was thinking about hurting himself. States things were going good but he started using again ( 9 months ago or so). He tried to OD 3 months ago with Tylenol 3. Did not pursue help then. Has also been using Benzodiazepines, Vyvance ( like cocaine) marijuana. He is increasingly more depressed, feeling very overwhelmed decided to come for help.  Reason for Continuation of Hospitalization: Clonidine taper-withdrawals Mood stabilization Medication management  Estimated length of stay: 2 days (d/c scheduled for Sunday) For review of initial/current patient goals, please see plan of care.  Attendees:  Patient:    Family:    Physician: Geoffery LyonsIrving  Lugo MD 12/07/2013 12:04 PM   Nursing: Shon BatonBrooks RN 12/07/2013 12:04 PM   Clinical Social Worker Danaria Larsen Smart, LCSWA  12/07/2013 12:04 PM   Other: Brayton ElBritney RN  12/07/2013 12:04 PM   Other:    Other: Darden DatesJennifer C. Nurse CM  12/07/2013 12:04 PM   Other:    Scribe for Treatment Team:  The Sherwin-WilliamsHeather Smart LCSWA 12/07/2013 12:04 PM

## 2013-12-07 NOTE — Progress Notes (Signed)
[facility]  12/07/2013 4:38 PM Ryan HemanSean J Rozario  MRN:  [id number] Subjective:  Gregary SignsSean endorses that he feels he needs to get going, get out, find a job. He will be staying with his mother and step father. 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 After he gets a full time job he plans to get his own place.  Diagnosis:   DSM5: Schizophrenia Disorders:  none Obsessive-Compulsive Disorders:  none Trauma-Stressor Disorders:  PTSD Substance/Addictive Disorders:  Opioid Disorder - Severe (304.00) Depressive Disorders:  Major Depressive Disorder - Moderate (296.22) Total Time spent with patient: 30 minutes  Axis I: Substance Induced Mood Disorder  ADL's:  Intact  Sleep: Fair  Appetite:  Fair  Suicidal Ideation:  Plan:  denies Intent:  denies Means:  denies Homicidal Ideation:  Plan:  denies Intent:  denies Means:  denies AEB (as evidenced by):  Psychiatric Specialty Exam: Physical Exam  Review of Systems  Constitutional: Negative.   HENT: Negative.   Eyes: Negative.   Respiratory: Negative.   Cardiovascular: Negative.   Gastrointestinal: Negative.   Genitourinary: Negative.   Musculoskeletal: Negative.   Skin: Negative.   Neurological: Negative.   Endo/Heme/Allergies: Negative.   Psychiatric/Behavioral: Positive for substance abuse. The patient is nervous/anxious and has insomnia.     Blood pressure 109/68, pulse 55, temperature 98.3 F (36.8 C), temperature source Oral, resp. rate 16, height 5\' 5"  (1.651 m), weight 90.719 kg (200 lb).Body mass index is 33.28 kg/(m^2).  General Appearance: Fairly Groomed  Patent attorneyye Contact::  Fair  Speech:  Clear and Coherent  Volume:  Decreased  Mood:  Anxious and worried, wants to get going with his plans, find a job, etc  Affect:  Restricted  Thought Process:  Coherent and Goal Directed  Orientation:  Full (Time, Place, and Person)   Thought Content:  worries, concerns  Suicidal Thoughts:  No  Homicidal Thoughts:  No  Memory:  Immediate;   Fair Recent;   Fair Remote;   Fair  Judgement:  Fair  Insight:  Present  Psychomotor Activity:  Normal  Concentration:  Fair  Recall:  F[employer]Fair  Fund of Knowledge:NA  Language: Fair  Akathisia:  No  Handed:    AIMS (if indicated):     Assets:  Desire for Improvement Housing Social Support  Sleep:  Number of Hours: 6.75   Musculoskeletal: Strength & Muscle Tone: within normal limits Gait & Station: normal Patient leans: N/A  Current Medications: Current Facility-Administered Medications  Medication Dose Route Frequency Provider Last Rate Last Dose  . acetaminophen (TYLENOL) tablet 650 mg  650 mg Oral Q6H PRN Kerry HoughSpencer E Simon, PA-C      . alum & mag hydroxide-simeth (MAALOX/MYLANTA) 200-200-20 MG/5ML suspension 30 mL  30 mL Oral Q4H PRN Kerry HoughSpencer E Simon, PA-C      . buPROPion (WELLBUTRIN XL) 24 hr tablet 150 mg  150 mg Oral Daily Rachael FeeIrving A Lataja Newland, MD   150 mg at 12/07/13 1211  . chlordiazePOXIDE (LIBRIUM) capsule 25 mg  25 mg Oral QID PRN Kerry HoughSpencer E Simon, PA-C      . cloNIDine (CATAPRES) tablet 0.1 mg  0.1 mg Oral QID Kerry HoughSpencer E Simon, PA-C   0.1 mg at 12/06/13 2148   Followed by  . [START ON 12/08/2013] cloNIDine (CATAPRES) tablet 0.1 mg  0.1 mg Oral BH-qamhs Kerry HoughSpencer E Simon, PA-C  Followed by  . [START ON 12/11/2013] cloNIDine (CATAPRES) tablet 0.1 mg  0.1 mg Oral QAC breakfast Kerry HoughSpencer E Simon, PA-C      . dicyclomine (BENTYL) tablet 20 mg  20 mg Oral Q6H PRN Kerry HoughSpencer E Simon, PA-C      . hydrOXYzine (ATARAX/VISTARIL) tablet 25 mg  25 mg Oral Q6H PRN Kerry HoughSpencer E Simon, PA-C   25 mg at 12/05/13 2146  . loperamide (IMODIUM) capsule 2-4 mg  2-4 mg Oral PRN Kerry HoughSpencer E Simon, PA-C      . magnesium hydroxide (MILK OF MAGNESIA) suspension 30 mL  30 mL Oral Daily PRN Kerry HoughSpencer E Simon, PA-C      . methocarbamol (ROBAXIN) tablet 500 mg  500 mg Oral Q8H PRN Kerry HoughSpencer E Simon, PA-C      .  naproxen (NAPROSYN) tablet 500 mg  500 mg Oral BID PRN Kerry HoughSpencer E Simon, PA-C      . ondansetron (ZOFRAN-ODT) disintegrating tablet 4 mg  4 mg Oral Q6H PRN Kerry HoughSpencer E Simon, PA-C      . traZODone (DESYREL) tablet 50 mg  50 mg Oral QHS,MR X 1 Kerry HoughSpencer E Simon, PA-C   50 mg at 12/06/13 2148    Lab Results: No results found for this or any previous visit (from the past 48 hour(s)).  Physical Findings: AIMS:  , ,  ,  ,    CIWA:  CIWA-Ar Total: 0 COWS:  COWS Total Score: 1  Treatment Plan Summary: Daily contact with patient to assess and evaluate symptoms and progress in treatment Medication management  Plan: Supportive approach/coping skills/relapse prevention           Pursue detox            Reassess for D/C in AM  Medical Decision Making Problem Points:  Review of psycho-social stressors (1) Data Points:  Review of new medications or change in dosage (2)  I certify that inpatient services furnished can reasonably be expected to improve the patient's condition.   Rachael Feerving A Jaun Galluzzo 12/07/2013, 4:38 PM

## 2013-12-08 DIAGNOSIS — F4310 Post-traumatic stress disorder, unspecified: Secondary | ICD-10-CM

## 2013-12-08 DIAGNOSIS — F1920 Other psychoactive substance dependence, uncomplicated: Secondary | ICD-10-CM

## 2013-12-08 MED ORDER — TRAZODONE HCL 50 MG PO TABS
50.0000 mg | ORAL_TABLET | Freq: Every evening | ORAL | Status: DC | PRN
  Filled 2013-12-08 (×2): qty 28

## 2013-12-08 MED ORDER — BUPROPION HCL ER (XL) 150 MG PO TB24
150.0000 mg | ORAL_TABLET | Freq: Every day | ORAL | Status: DC
  Filled 2013-12-08: qty 14

## 2013-12-08 MED ORDER — BUPROPION HCL ER (XL) 150 MG PO TB24: 150.0000 mg | ORAL_TABLET | Freq: Every day | ORAL | Status: DC

## 2013-12-08 NOTE — [facility] Suicide Risk Assessment (Signed)
   Demographic Factors:  Male, Caucasian and lives with parents  Total Time spent with patient: 20 minutes  Psychiatric Specialty Exam: Physical Exam  Psychiatric: He has a normal mood and affect. His speech is normal and behavior is normal. Judgment and thought content normal. Cognition and memory are normal.    Review of Systems  Constitutional: Negative.   HENT: Negative.   Eyes: Negative.   Respiratory: Negative.   Cardiovascular: Negative.   Gastrointestinal: Negative.   Genitourinary: Negative.   Musculoskeletal: Negative.   Skin: Negative.   Neurological: Negative.   Endo/Heme/Allergies: Negative.   Psychiatric/Behavioral: Negative.     Blood pressure 126/78, pulse 63, temperature 97.9 F (36.6 C), temperature source Oral, resp. rate 18, height 5\' 5"  (1.651 m), weight 90.719 kg (200 lb).Body mass index is 33.28 kg/(m^2).  General Appearance: Negative  Eye Contact::  Good  Speech:  Clear and Coherent and Normal Rate  Volume:  Normal  Mood:  Euthymic  Affect:  Appropriate  Thought Process:  Circumstantial  Orientation:  Full (Time, Place, and Person)  Thought Content:  Negative  Suicidal Thoughts:  No  Homicidal Thoughts:  No  Memory:  Immediate;   Fair Recent;   Fair Remote;   Fair  Judgement:  Fair  Insight:  Fair  Psychomotor Activity:  Normal  Concentration:  Fair  Recall:  [employer]Fair  Fund of Knowledge:Fair  Language: Good  Akathisia:  No  Handed:  Right  AIMS (if indicated):     Assets:  Communication Skills Desire for Improvement Physical Health  Sleep:  Number of Hours: 6.75    Musculoskeletal: Strength & Muscle Tone: within normal limits Gait & Station: normal Patient leans: N/A   Mental Status Per Nursing Assessment::   On Admission:     Current Mental Status by Physician: patient denies suicidal ideation, intent or plan  Loss Factors: NA  Historical Factors: Impulsivity  Risk Reduction Factors:   Sense of responsibility to family,  Living with another person, especially a relative and Positive social support  Continued Clinical Symptoms:  Alcohol/Substance Abuse/Dependencies  Cognitive Features That Contribute To Risk:  Closed-mindedness    Suicide Risk:  Minimal: No identifiable suicidal ideation.  Patients presenting with no risk factors but with morbid ruminations; may be classified as minimal risk based on the severity of the depressive symptoms  Discharge Diagnoses:   AXIS I:  Polysubstance (including opioids) dependence with physiological dependence              PTSD  AXIS II:  Deferred AXIS III:   Past Medical History  Diagnosis Date  . Substance abuse   . PTSD (post-traumatic stress disorder)   . Attempted suicide   . Anxiety    AXIS IV:  other psychosocial or environmental problems and problems related to social environment AXIS V:  61-70 mild symptoms  Plan Of Care/Follow-up recommendations:  Activity:  as tolerated Diet:  healthy Tests:  routine Other:  patient to keep his after care appointment  Is patient on multiple antipsychotic therapies at discharge:  No   Has Patient had three or more failed trials of antipsychotic monotherapy by history:  No  Recommended Plan for Multiple Antipsychotic Therapies: NA    Thedore MinsMojeed Maricus Tanzi, MD 12/08/2013, 10:59 AM

## 2013-12-08 NOTE — [facility] Group Notes (Signed)
[facility] Group Notes:  (Nursing/MHT/Case Management/Adjunct)  Date:  12/08/2013  Time: 0900 am  Type of Therapy:  Self Inventory Group  Participation Level:  Did Not Attend  Ryan Kelley [name] 12/08/2013, 11:54 AM 

## 2013-12-08 NOTE — Progress Notes (Signed)
D: Pt denies SI/HI/AVH. Pt is pleasant and cooperative. Pt stated everything was fine he was ready to go and get on with his life.   A: Pt was offered support and encouragement. Pt was given scheduled medications. Pt was encourage to attend groups. Q 15 minute checks were done for safety.   R:Pt attends groups and interacts well with peers and staff. Pt is taking medication. Pt has no complaints at this time.Pt receptive to treatment and safety maintained on unit.

## 2013-12-08 NOTE — [facility] Group Notes (Signed)
[facility] LCSW Group Therapy  12/08/2013 12:34 PM  Type of Therapy:  Group Therapy  Participation Level:  Minimal  Participation Quality:  Supportive  Affect:  Flat  Cognitive:  Alert and Oriented  Insight:  Developing/Improving  Engagement in Therapy:  Lacking  Modes of Intervention:  Discussion, Education, Exploration, Rapport Building and Support  Summary of Progress/Problems: Pt came to group and was alert.  Pt did not participate in group discussion, however when prompted to identify supports and coping skills he did share.  Pt was supportive of other's sharing and did not disrupt group.   Ryan SpatesRachel Anne Jorie Zee 12/08/2013, 12:34 PM

## 2013-12-08 NOTE — Progress Notes (Signed)
Pt d/c. AVS gone over. Items returned. Denies si/hi/avh. Denies pain. No signs of distress noted.

## 2013-12-12 NOTE — Progress Notes (Signed)
Patient Discharge Instructions:  After Visit Summary (AVS):   Faxed to:  12/12/13 Psychiatric Admission Assessment Note:   Faxed to:  12/12/13 Suicide Risk Assessment - Discharge Assessment:   Faxed to:  12/12/13 Faxed/Sent to the Next Level Care provider:  12/12/13 Faxed to [facility] @ [phone]  Jerelene ReddenSheena E Garrison, 12/12/2013, 4:09 PM

## 2013-12-17 NOTE — Discharge Summary (Signed)
Physician Discharge Summary Note  Patient:  Ryan Kelley is an 28 y.o., male MRN:  [id number] DOB:  13-Apr-1986 Patient phone:  [phone] (home)  Patient address:   [address] [place] [address],  Total Time spent with patient: 30 minutes  Date of Admission:  12/05/2013 Date of Discharge: 12/08/2013  Reason for Admission:  Alcohol and substance abuse  Discharge Diagnoses: Principal Problem:   Polysubstance (including opioids) dependence with physiological dependence Active Problems:   PTSD (post-traumatic stress disorder)   Substance induced mood disorder   Psychiatric Specialty Exam: Physical Exam  ROS  Blood pressure 126/78, pulse 63, temperature 97.9 F (36.6 C), temperature source Oral, resp. rate 18, height 5\' 5"  (1.651 m), weight 90.719 kg (200 lb).Body mass index is 33.28 kg/(m^2).   General Appearance: Negative  Eye Contact::  Good  Speech:  Clear and Coherent and Normal Rate  Volume:  Normal  Mood:  Euthymic  Affect:  Appropriate  Thought Process:  Circumstantial  Orientation:  Full (Time, Place, and Person)  Thought Content:  Negative  Suicidal Thoughts:  No  Homicidal Thoughts:  No  Memory:  Immediate;   Fair Recent;   Fair Remote;   Fair  Judgement:  Fair  Insight:  Fair  Psychomotor Activity:  Normal  Concentration:  Fair  Recall:  FiservFair  Fund of Knowledge:Fair  Language: Good  Akathisia:  No  Handed:  Right  AIMS (if indicated):     Assets:  Communication Skills Desire for Improvement Physical Health  Sleep:  Number of Hours: 6.75    Musculoskeletal: Strength & Muscle Tone: within normal limits Gait & Station: normal Patient leans: N/A   DSM5:  AXIS I:  Polysubstance (including opioids) dependence with physiological dependence              PTSD  AXIS II:  Deferred AXIS III:   Past Medical History  Diagnosis Date  . Substance abuse   . PTSD (post-traumatic stress disorder)   . Attempted suicide   . Anxiety     AXIS IV:  other psychosocial or environmental problems and problems related to social environment AXIS V:  61-70 mild symptoms  Level of Care:  OP  Hospital Course:  28 Y/O male who after he left here last year,he lost his job,( main reason was staying home as he was sick from drugs) then broke up with girlfriend. Admits to Increased stress. States he had racing thoughts, ideas coming up rapidly to his mind. States that this has been going on for a year. States he gets depressed. States he is using 25 mg of opioids every day. States that before he came he was thinking about hurting himself. States things were going good but he started using again ( 9 months ago or so). He tried to OD 3 months ago with Tylenol 3. Did not pursue help then. Has also been using Benzodiazepines, Vyvance ( like cocaine) marijuana. He is increasingly more depressed, feeling very overwhelmed decided to come for help  During Hospitalization: Medications managed, psychoeducation, group and individual therapy. Pt currently denies SI, HI, and Psychosis. At discharge, pt rates anxiety and depression as being improved. Pt states that he does have a good supportive home environment and will followup with outpatient treatment. Affirms agreement with medication regimen and discharge plan. Denies other physical and psychological concerns at time of discharge.   Consults:  None  Significant Diagnostic Studies:  None  Discharge Vitals:   Blood pressure 126/78, pulse 63,  temperature 97.9 F (36.6 C), temperature source Oral, resp. rate 18, height 5\' 5"  (1.651 m), weight 90.719 kg (200 lb). Body mass index is 33.28 kg/(m^2). Lab Results:   No results found for this or any previous visit (from the past 72 hour(s)).  Physical Findings: AIMS:  , ,  ,  ,    CIWA:  CIWA-Ar Total: 0 COWS:  COWS Total Score: 1  Psychiatric Specialty Exam: See Psychiatric Specialty Exam and Suicide Risk Assessment completed by Attending Physician  prior to discharge.  Discharge destination:  Home  Is patient on multiple antipsychotic therapies at discharge:  No   Has Patient had three or more failed trials of antipsychotic monotherapy by history:  No  Recommended Plan for Multiple Antipsychotic Therapies: NA     Medication List       Indication   buPROPion 150 MG 24 hr tablet  Commonly known as:  WELLBUTRIN XL  Take 1 tablet (150 mg total) by mouth daily.   Indication:  mood stabilization           Follow-up Information   Follow up with [facility] On 12/13/2013. (Appt. on 3:00PM with Ms. Young for hospital followup and diagnostic assessment for therapy services. Please arrive 15 minutes early to complete new patient paperwork. )    Contact information:   [address] [place], [place] [address] Phone: [phone] Fax: [phone]      Follow-up recommendations:  Activity:  As tolerated. Diet:  Heart healthy with low sodium.  Comments:   Take all medications as prescribed. Keep all follow-up appointments as scheduled.  Do not consume alcohol or use illegal drugs while on prescription medications. Report any adverse effects from your medications to your primary care provider promptly.  In the event of recurrent symptoms or worsening symptoms, call 911, a crisis hotline, or go to the nearest emergency department for evaluation.   Total Discharge Time:  Greater than 30 minutes.  Signed: Beau FannyJohn C Withrow, FNP-BC 12/08/2013, 11:48 AM     Patient seen, evaluated and I agree with notes by Nurse Practitioner. Thedore MinsMojeed Colyn Miron, MD

## 2013-12-22 ENCOUNTER — Encounter (HOSPITAL_COMMUNITY): Payer: Self-pay | Admitting: Emergency Medicine

## 2013-12-22 ENCOUNTER — Emergency Department (HOSPITAL_COMMUNITY)
Admission: EM | Admit: 2013-12-22 | Discharge: 2013-12-22 | Disposition: A | Discharge status: Home / Self Care | Payer: Self-pay | Attending: Emergency Medicine | Admitting: Emergency Medicine

## 2013-12-22 DIAGNOSIS — M545 Low back pain, unspecified: Secondary | ICD-10-CM

## 2013-12-22 DIAGNOSIS — F17200 Nicotine dependence, unspecified, uncomplicated: Secondary | ICD-10-CM | POA: Insufficient documentation

## 2013-12-22 DIAGNOSIS — W28XXXA Contact with powered lawn mower, initial encounter: Secondary | ICD-10-CM | POA: Insufficient documentation

## 2013-12-22 DIAGNOSIS — Y9289 Other specified places as the place of occurrence of the external cause: Secondary | ICD-10-CM | POA: Insufficient documentation

## 2013-12-22 DIAGNOSIS — Z79899 Other long term (current) drug therapy: Secondary | ICD-10-CM | POA: Insufficient documentation

## 2013-12-22 DIAGNOSIS — Y9389 Activity, other specified: Secondary | ICD-10-CM | POA: Insufficient documentation

## 2013-12-22 DIAGNOSIS — M5380 Other specified dorsopathies, site unspecified: Secondary | ICD-10-CM | POA: Insufficient documentation

## 2013-12-22 DIAGNOSIS — F411 Generalized anxiety disorder: Secondary | ICD-10-CM | POA: Insufficient documentation

## 2013-12-22 MED ORDER — TRAMADOL HCL 50 MG PO TABS: 50.0000 mg | ORAL_TABLET | Freq: Four times a day (QID) | ORAL | Status: DC | PRN

## 2013-12-22 MED ORDER — MELOXICAM 7.5 MG PO TABS: 7.5000 mg | ORAL_TABLET | Freq: Every day | ORAL | Status: DC

## 2013-12-22 MED ORDER — DIAZEPAM 5 MG PO TABS
5.0000 mg | ORAL_TABLET | Freq: Once | ORAL | Status: AC
  Administered 2013-12-22: 5 mg via ORAL
  Filled 2013-12-22: qty 1

## 2013-12-22 MED ORDER — CYCLOBENZAPRINE HCL 10 MG PO TABS: 10.0000 mg | ORAL_TABLET | Freq: Two times a day (BID) | ORAL | Status: DC | PRN

## 2013-12-22 NOTE — ED Provider Notes (Signed)
CSN: [id number]     Arrival date & time 12/22/13  0935 History   First MD Initiated Contact with Patient 12/22/13 0940     Chief Complaint  Patient presents with  . Back Pain     (Consider location/radiation/quality/duration/timing/severity/associated sxs/prior Treatment) Patient is a 28 y.o. male presenting with back pain. The history is provided by the patient.  Back Pain Location:  Lumbar spine Quality:  Aching Radiates to:  Does not radiate Pain severity:  Severe Pain is:  Same all the time Onset quality:  Sudden Duration:  1 day Timing:  Constant Progression:  Unchanged Chronicity:  New Context: physical stress   Relieved by: percocet. Worsened by:  Movement, standing and twisting Ineffective treatments:  Ibuprofen Associated symptoms: no abdominal pain, no bladder incontinence, no bowel incontinence, no chest pain, no leg pain, no numbness and no tingling    Ryan Kelley is a 28 y.o. male who presents to the ED with low back pain. The pain started yesterday while he was mowing the grass. Then he lifted the push mower to hand it over the fence to someone and felt a sever pulling in his lower back. He took ibuprofen and it did not help. Later he took one of his mother's percocet 10/325 and it helped and he went to sleep. This morning the pain is back. He has a history of back problems and with Polysubstance abuse.   Past Medical History  Diagnosis Date  . Substance abuse   . PTSD (post-traumatic stress disorder)   . Attempted suicide   . Anxiety    History reviewed. No pertinent past surgical history. Family History  Problem Relation Age of Onset  . Diabetes Mother   . Cancer Mother   . Hypertension Mother   . Hypertension Father    History  Substance Use Topics  . Smoking status: Current Every Day Smoker -- 1.00 packs/day for 7 years    Types: Cigarettes  . Smokeless tobacco: Not on file  . Alcohol Use: 0.0 oz/week     Comment: once a week, occasional use     Review of Systems  HENT: Negative.   Eyes: Negative for visual disturbance.  Respiratory: Negative for shortness of breath.   Cardiovascular: Negative for chest pain.  Gastrointestinal: Negative for nausea, vomiting, abdominal pain and bowel incontinence.  Genitourinary: Negative for bladder incontinence.  Musculoskeletal: Positive for back pain.  Skin: Negative for wound.  Neurological: Negative for tingling and numbness.  Psychiatric/Behavioral: Negative for confusion.      Allergies  Review of patient's allergies indicates no known allergies.  Home Medications   Prior to Admission medications   Medication Sig Start Date End Date Taking? Authorizing Provider  buPROPion (WELLBUTRIN XL) 150 MG 24 hr tablet Take 1 tablet (150 mg total) by mouth daily. 12/08/13   Beau FannyJohn C Withrow, FNP   There were no vitals taken for this visit. Physical Exam  Nursing note and vitals reviewed. Constitutional: He is oriented to person, place, and time. He appears well-developed and well-nourished. No distress.  HENT:  Head: Normocephalic and atraumatic.  Eyes: EOM are normal. Pupils are equal, round, and reactive to light.  Neck: Normal range of motion. Neck supple.  Cardiovascular: Normal rate and regular rhythm.   Pulmonary/Chest: Effort normal. No respiratory distress. He has no wheezes. He has no rales.  Abdominal: Soft. Bowel sounds are normal. There is no tenderness.  Musculoskeletal: Normal range of motion. He exhibits no edema.  Lumbar back: He exhibits tenderness and spasm. He exhibits normal range of motion, no deformity and normal pulse.       Back:  Pedal pulses equal, adequate circulation, good touch sensation. Straight leg raises without difficulty. Steady gait without foot drag.   Neurological: He is alert and oriented to person, place, and time. He has normal strength. No cranial nerve deficit or sensory deficit. Coordination and gait normal.  Reflex Scores:      Bicep  reflexes are 2+ on the right side and 2+ on the left side.      Brachioradialis reflexes are 2+ on the right side and 2+ on the left side.      Patellar reflexes are 2+ on the right side and 2+ on the left side.      Achilles reflexes are 2+ on the right side and 2+ on the left side. Skin: Skin is warm and dry.  Psychiatric: He has a normal mood and affect. His behavior is normal.    ED Course  Procedures   MDM  28 y.o. male with low back pain s/p injury yesterday. Stable for discharge without neuro deficits. Discussed with the patient and all questioned fully answered. He will follow up with his PCP or return here as needed if any problems arise.    Medication List    TAKE these medications       cyclobenzaprine 10 MG tablet  Commonly known as:  FLEXERIL  Take 1 tablet (10 mg total) by mouth 2 (two) times daily as needed for muscle spasms.     meloxicam 7.5 MG tablet  Commonly known as:  MOBIC  Take 1 tablet (7.5 mg total) by mouth daily.      ASK your doctor about these medications       buPROPion 150 MG 24 hr tablet  Commonly known as:  WELLBUTRIN XL  Take 1 tablet (150 mg total) by mouth daily.           [facility] Orlene OchM Jaslynn Thome, NP 12/22/13 1021

## 2013-12-22 NOTE — Discharge Instructions (Signed)
Follow up with your primary care doctor on Monday. Return here as needed. Do not drive while taking the muscle relaxant as it will make you sleepy.

## 2013-12-22 NOTE — ED Notes (Signed)
Pt reports has chronic lower back pain and aggravated it yesterday picking up a push mower.

## 2013-12-22 NOTE — ED Provider Notes (Signed)
Medical screening examination/treatment/procedure(s) were performed by non-physician practitioner and as supervising physician I was immediately available for consultation/collaboration.   EKG Interpretation None       Iktan Aikman, MD 12/22/13 1402 

## 2013-12-22 NOTE — ED Notes (Signed)
Pt c/o lower back pain that began yesterday. Pt states pain began while working in yard. Denies injury.

## 2014-02-05 ENCOUNTER — Emergency Department: Payer: Self-pay | Admitting: Emergency Medicine

## 2014-07-05 ENCOUNTER — Emergency Department: Payer: Self-pay | Admitting: Emergency Medicine

## 2014-07-05 LAB — DRUG SCREEN, URINE
Amphetamines, Ur Screen: NEGATIVE (ref ?–1000)
BARBITURATES, UR SCREEN: NEGATIVE (ref ?–200)
Benzodiazepine, Ur Scrn: POSITIVE (ref ?–200)
Cannabinoid 50 Ng, Ur ~~LOC~~: POSITIVE (ref ?–50)
Cocaine Metabolite,Ur ~~LOC~~: NEGATIVE (ref ?–300)
MDMA (Ecstasy)Ur Screen: POSITIVE (ref ?–500)
Methadone, Ur Screen: NEGATIVE (ref ?–300)
OPIATE, UR SCREEN: POSITIVE (ref ?–300)
Phencyclidine (PCP) Ur S: NEGATIVE (ref ?–25)
TRICYCLIC, UR SCREEN: NEGATIVE (ref ?–1000)

## 2014-07-05 LAB — COMPREHENSIVE METABOLIC PANEL
Albumin: 3.7 g/dL (ref 3.4–5.0)
Alkaline Phosphatase: 76 U/L
Anion Gap: 5 — ABNORMAL LOW (ref 7–16)
BUN: 5 mg/dL — ABNORMAL LOW (ref 7–18)
Bilirubin,Total: 0.2 mg/dL (ref 0.2–1.0)
CHLORIDE: 109 mmol/L — AB (ref 98–107)
CO2: 25 mmol/L (ref 21–32)
Calcium, Total: 8.3 mg/dL — ABNORMAL LOW (ref 8.5–10.1)
Creatinine: 0.76 mg/dL (ref 0.60–1.30)
EGFR (African American): >60
EGFR (Non-African Amer.): >60
Glucose: 128 mg/dL — ABNORMAL HIGH (ref 65–99)
Osmolality: 276 (ref 275–301)
Potassium: 3.6 mmol/L (ref 3.5–5.1)
SGOT(AST): 33 U/L (ref 15–37)
SGPT (ALT): 49 U/L
Sodium: 139 mmol/L (ref 136–145)
TOTAL PROTEIN: 6.8 g/dL (ref 6.4–8.2)

## 2014-07-05 LAB — URINALYSIS, COMPLETE
BACTERIA: NONE SEEN
BILIRUBIN, UR: NEGATIVE
BLOOD: NEGATIVE
Glucose,UR: NEGATIVE mg/dL (ref 0–75)
Ketone: NEGATIVE
Leukocyte Esterase: NEGATIVE
NITRITE: NEGATIVE
PROTEIN: NEGATIVE
Ph: 6 (ref 4.5–8.0)
RBC,UR: <1 /HPF (ref 0–5)
Specific Gravity: 1.002 (ref 1.003–1.030)
Squamous Epithelial: <1
WBC UR: <1 /HPF (ref 0–5)

## 2014-07-05 LAB — CBC WITH DIFFERENTIAL/PLATELET
BASOS ABS: 0.1 10*3/uL (ref 0.0–0.1)
Basophil %: 0.7 %
Eosinophil #: 0.3 10*3/uL (ref 0.0–0.7)
Eosinophil %: 3.2 %
HCT: 44.4 % (ref 40.0–52.0)
HGB: 15.3 g/dL (ref 13.0–18.0)
Lymphocyte #: 3.1 10*3/uL (ref 1.0–3.6)
Lymphocyte %: 38.7 %
MCH: 31.6 pg (ref 26.0–34.0)
MCHC: 34.6 g/dL (ref 32.0–36.0)
MCV: 91 fL (ref 80–100)
MONO ABS: 0.5 x10 3/mm (ref 0.2–1.0)
Monocyte %: 6.2 %
NEUTROS PCT: 51.2 %
Neutrophil #: 4.1 10*3/uL (ref 1.4–6.5)
PLATELETS: 302 10*3/uL (ref 150–440)
RBC: 4.86 10*6/uL (ref 4.40–5.90)
RDW: 12.8 % (ref 11.5–14.5)
WBC: 8 10*3/uL (ref 3.8–10.6)

## 2014-07-05 LAB — ETHANOL: ETHANOL LVL: 171 mg/dL

## 2014-07-05 LAB — ACETAMINOPHEN LEVEL

## 2014-07-05 LAB — SALICYLATE LEVEL: Salicylates, Serum: 3.7 mg/dL — ABNORMAL HIGH

## 2015-03-13 ENCOUNTER — Emergency Department (HOSPITAL_COMMUNITY)
Admission: EM | Admit: 2015-03-13 | Discharge: 2015-03-13 | Disposition: A | Discharge status: Home / Self Care | Payer: Self-pay | Attending: Emergency Medicine | Admitting: Emergency Medicine

## 2015-03-13 ENCOUNTER — Encounter (HOSPITAL_COMMUNITY): Payer: Self-pay | Admitting: Emergency Medicine

## 2015-03-13 DIAGNOSIS — Z720 Tobacco use: Secondary | ICD-10-CM | POA: Insufficient documentation

## 2015-03-13 DIAGNOSIS — F419 Anxiety disorder, unspecified: Secondary | ICD-10-CM | POA: Insufficient documentation

## 2015-03-13 DIAGNOSIS — R112 Nausea with vomiting, unspecified: Secondary | ICD-10-CM | POA: Insufficient documentation

## 2015-03-13 DIAGNOSIS — R51 Headache: Secondary | ICD-10-CM | POA: Insufficient documentation

## 2015-03-13 DIAGNOSIS — R519 Headache, unspecified: Secondary | ICD-10-CM

## 2015-03-13 DIAGNOSIS — H53149 Visual discomfort, unspecified: Secondary | ICD-10-CM | POA: Insufficient documentation

## 2015-03-13 MED ORDER — DIPHENHYDRAMINE HCL 50 MG/ML IJ SOLN
25.0000 mg | Freq: Once | INTRAMUSCULAR | Status: AC
  Administered 2015-03-13: 25 mg via INTRAVENOUS
  Filled 2015-03-13: qty 1

## 2015-03-13 MED ORDER — PROCHLORPERAZINE EDISYLATE 5 MG/ML IJ SOLN
10.0000 mg | Freq: Once | INTRAMUSCULAR | Status: AC
  Administered 2015-03-13: 10 mg via INTRAVENOUS
  Filled 2015-03-13: qty 2

## 2015-03-13 MED ORDER — SODIUM CHLORIDE 0.9 % IV BOLUS (SEPSIS)
1000.0000 mL | Freq: Once | INTRAVENOUS | Status: AC
  Administered 2015-03-13: 1000 mL via INTRAVENOUS

## 2015-03-13 MED ORDER — DEXAMETHASONE SODIUM PHOSPHATE 10 MG/ML IJ SOLN
10.0000 mg | Freq: Once | INTRAMUSCULAR | Status: AC
  Administered 2015-03-13: 10 mg via INTRAVENOUS
  Filled 2015-03-13: qty 1

## 2015-03-13 MED ORDER — SUMATRIPTAN SUCCINATE 50 MG PO TABS: ORAL_TABLET | ORAL | Status: DC

## 2015-03-13 NOTE — ED Provider Notes (Signed)
CSN: [id number]     Arrival date & time 03/13/15  1636 History   First MD Initiated Contact with Patient 03/13/15 1652     Chief Complaint  Patient presents with  . Migraine     (Consider location/radiation/quality/duration/timing/severity/associated sxs/prior Treatment) The history is provided by the patient.   Ryan Kelley is a 29 y.o. male with a past medical history of PTSD, anxiety and distant history of substance abuse but not currently, presenting with a 4 day history of migraine-like headache.  He describes left-sided slowly progressive frontal headache starting 4 days ago which has not responded to multiple doses of ibuprofen, aspirin or Tylenol.  His mother has a history of migraines and has lent several tablets of Imitrex which also has not relieved his headache.  He describes constant pressure and pain and over the past 2 days has also developed photophobia, phonophobia and slightly blurred vision.  Has had no fevers or chills, denies neck pain or stiffness, he has been quite nauseatead and had 2 episodes of vomiting over the past 24 hours.  He reports previous similar headache symptoms but this is the longest lasting episode which has not responded to OTC medicines.  He also denies chest pain, shortness of breath, abdominal pain, rash, tick exposures.  He has been maintaining hydration.    Past Medical History  Diagnosis Date  . Substance abuse   . PTSD (post-traumatic stress disorder)   . Attempted suicide   . Anxiety    History reviewed. No pertinent past surgical history. Family History  Problem Relation Age of Onset  . Diabetes Mother   . Cancer Mother   . Hypertension Mother   . Hypertension Father    History  Substance Use Topics  . Smoking status: Current Every Day Smoker -- 1.00 packs/day for 7 years    Types: Cigarettes  . Smokeless tobacco: Not on file  . Alcohol Use: 0.0 oz/week     Comment: once a week, occasional use    Review of Systems   Constitutional: Negative for fever and chills.  HENT: Negative for congestion, rhinorrhea and sore throat.   Eyes: Positive for photophobia and visual disturbance.  Respiratory: Negative for chest tightness and shortness of breath.   Cardiovascular: Negative for chest pain.  Gastrointestinal: Positive for nausea and vomiting. Negative for abdominal pain.  Genitourinary: Negative.   Musculoskeletal: Negative for joint swelling, arthralgias and neck pain.  Skin: Negative.  Negative for color change, rash and wound.  Neurological: Positive for headaches. Negative for dizziness, seizures, facial asymmetry, speech difficulty, weakness, light-headedness and numbness.  Psychiatric/Behavioral: Negative.       Allergies  Review of patient's allergies indicates no known allergies.  Home Medications   Prior to Admission medications   Medication Sig Start Date End Date Taking? Authorizing Provider  buPROPion (WELLBUTRIN XL) 150 MG 24 hr tablet Take 1 tablet (150 mg total) by mouth daily. Patient not taking: Reported on 03/13/2015 12/08/13   Beau Fanny, FNP  cyclobenzaprine (FLEXERIL) 10 MG tablet Take 1 tablet (10 mg total) by mouth 2 (two) times daily as needed for muscle spasms. Patient not taking: Reported on 03/13/2015 12/22/13   Janne Napoleon, NP  meloxicam (MOBIC) 7.5 MG tablet Take 1 tablet (7.5 mg total) by mouth daily. Patient not taking: Reported on 03/13/2015 12/22/13   Janne Napoleon, NP  SUMAtriptan (IMITREX) 50 MG tablet May repeat in 2 hours if headache persists or recurs.  Do not exceed more than 4  tablets in 24 hours 03/13/15   Burgess Amor, PA-C  traMADol (ULTRAM) 50 MG tablet Take 1 tablet (50 mg total) by mouth every 6 (six) hours as needed. Patient not taking: Reported on 03/13/2015 12/22/13   Janne Napoleon, NP   BP 104/70 mmHg  Pulse 62  Temp(Src) 99.1 F (37.3 C) (Oral)  Resp 16  Ht  (1.651 m)  Wt 210 lb (95.255 kg)  BMI 34.95 kg/m2  SpO2 97% Physical Exam  Constitutional:  He is oriented to person, place, and time. He appears well-developed and well-nourished.  Patient wearing sunglasses in a darkened room  HENT:  Head: Normocephalic and atraumatic.  Mouth/Throat: Oropharynx is clear and moist.  Eyes: EOM are normal. Pupils are equal, round, and reactive to light.  Neck: Normal range of motion and full passive range of motion without pain. Neck supple. No spinous process tenderness and no muscular tenderness present.  Cardiovascular: Normal rate and normal heart sounds.   Pulmonary/Chest: Effort normal.  Abdominal: Soft. There is no tenderness.  Musculoskeletal: Normal range of motion.  Lymphadenopathy:    He has no cervical adenopathy.  Neurological: He is alert and oriented to person, place, and time. He has normal strength. No sensory deficit. Coordination and gait normal. GCS eye subscore is 4. GCS verbal subscore is 5. GCS motor subscore is 6.  Normal heel-shin, normal rapid alternating movements. Cranial nerves III-XII intact.  No pronator drift.  Skin: Skin is warm and dry. No rash noted.  Psychiatric: He has a normal mood and affect. His speech is normal and behavior is normal. Thought content normal. Cognition and memory are normal.  Nursing note and vitals reviewed.   ED Course  Procedures (including critical care time) Labs Review Labs Reviewed - No data to display  Imaging Review No results found.   EKG Interpretation None      MDM   Final diagnoses:  Acute nonintractable headache, unspecified headache type    Headache classic for migraine, patient and mother endorses prior similar sx, but not recently.  He has never been evaluated for this problem by pcp.  He had no neurologic findings on exam tonight and responded with relief of headache with the meds given including benadryl, compazine and decadron.  Pt also given IV NS 1 liter.  Pain resolved at time of dc.  He was given a prescription for imitrex,  I suspect if he takes this  medicine at the first sign of headache, it may do a much better job controlling headache.  Advised return here or call to pcp for any return of or worsened sx.  The patient appears reasonably screened and/or stabilized for discharge and I doubt any other medical condition or other [facility] requiring further screening, evaluation, or treatment in the ED at this time prior to discharge.     Burgess Amor, PA-C 03/14/15 0148  Gerhard Munch, MD 03/27/15 1537

## 2015-03-13 NOTE — Discharge Instructions (Signed)

## 2015-03-13 NOTE — ED Notes (Signed)
Pt. Resting comfortably in bed with eyes closed. Even rise and fall of chest noted. No distress noted. Family denies any needs at this time.

## 2015-03-13 NOTE — ED Notes (Signed)
Headache x 4 day, light and sounds sensitivity, dizziness, nausea, vomiting,  Never had headache this bad, had to miss work today.

## 2015-11-25 ENCOUNTER — Emergency Department (HOSPITAL_COMMUNITY)
Admission: EM | Admit: 2015-11-25 | Discharge: 2015-11-25 | Disposition: A | Discharge status: Home / Self Care | Payer: Self-pay | Attending: Emergency Medicine | Admitting: Emergency Medicine

## 2015-11-25 ENCOUNTER — Encounter (HOSPITAL_COMMUNITY): Payer: Self-pay | Admitting: *Deleted

## 2015-11-25 ENCOUNTER — Emergency Department (HOSPITAL_COMMUNITY): Payer: Self-pay

## 2015-11-25 DIAGNOSIS — S8001XA Contusion of right knee, initial encounter: Secondary | ICD-10-CM | POA: Insufficient documentation

## 2015-11-25 DIAGNOSIS — Z23 Encounter for immunization: Secondary | ICD-10-CM | POA: Insufficient documentation

## 2015-11-25 DIAGNOSIS — Y9289 Other specified places as the place of occurrence of the external cause: Secondary | ICD-10-CM | POA: Insufficient documentation

## 2015-11-25 DIAGNOSIS — L237 Allergic contact dermatitis due to plants, except food: Secondary | ICD-10-CM | POA: Insufficient documentation

## 2015-11-25 DIAGNOSIS — Y990 Civilian activity done for income or pay: Secondary | ICD-10-CM | POA: Insufficient documentation

## 2015-11-25 DIAGNOSIS — S5001XA Contusion of right elbow, initial encounter: Secondary | ICD-10-CM | POA: Insufficient documentation

## 2015-11-25 DIAGNOSIS — Y9389 Activity, other specified: Secondary | ICD-10-CM | POA: Insufficient documentation

## 2015-11-25 DIAGNOSIS — S80211A Abrasion, right knee, initial encounter: Secondary | ICD-10-CM

## 2015-11-25 DIAGNOSIS — F17210 Nicotine dependence, cigarettes, uncomplicated: Secondary | ICD-10-CM | POA: Insufficient documentation

## 2015-11-25 DIAGNOSIS — W228XXA Striking against or struck by other objects, initial encounter: Secondary | ICD-10-CM | POA: Insufficient documentation

## 2015-11-25 MED ORDER — TRAMADOL HCL 50 MG PO TABS: 50.0000 mg | ORAL_TABLET | Freq: Four times a day (QID) | ORAL | Status: DC | PRN

## 2015-11-25 MED ORDER — SULFAMETHOXAZOLE-TRIMETHOPRIM 800-160 MG PO TABS: 1.0000 | ORAL_TABLET | Freq: Two times a day (BID) | ORAL | Status: AC

## 2015-11-25 MED ORDER — PREDNISONE 10 MG PO TABS: ORAL_TABLET | ORAL | Status: DC

## 2015-11-25 MED ORDER — TETANUS-DIPHTH-ACELL PERTUSSIS 5-2.5-18.5 LF-MCG/0.5 IM SUSP
0.5000 mL | Freq: Once | INTRAMUSCULAR | Status: AC
  Administered 2015-11-25: 0.5 mL via INTRAMUSCULAR
  Filled 2015-11-25: qty 0.5

## 2015-11-25 MED ORDER — PREDNISONE 50 MG PO TABS
60.0000 mg | ORAL_TABLET | Freq: Once | ORAL | Status: AC
  Administered 2015-11-25: 60 mg via ORAL
  Filled 2015-11-25: qty 1

## 2015-11-25 MED ORDER — HYDROCODONE-ACETAMINOPHEN 5-325 MG PO TABS
1.0000 | ORAL_TABLET | Freq: Once | ORAL | Status: AC
  Administered 2015-11-25: 1 via ORAL
  Filled 2015-11-25: qty 1

## 2015-11-25 NOTE — Discharge Instructions (Signed)
Contusion A contusion is a deep bruise. Contusions happen when an injury causes bleeding under the skin. Symptoms of bruising include pain, swelling, and discolored skin. The skin may turn blue, purple, or yellow. HOME CARE   Rest the injured area.  If told, put ice on the injured area.  Put ice in a plastic bag.  Place a towel between your skin and the bag.  Leave the ice on for 20 minutes, 2-3 times per day.  If told, put light pressure (compression) on the injured area using an elastic bandage. Make sure the bandage is not too tight. Remove it and put it back on as told by your doctor.  If possible, raise (elevate) the injured area above the level of your heart while you are sitting or lying down.  Take over-the-counter and prescription medicines only as told by your doctor. GET HELP IF:  Your symptoms do not get better after several days of treatment.  Your symptoms get worse.  You have trouble moving the injured area. GET HELP RIGHT AWAY IF:   You have very bad pain.  You have a loss of feeling (numbness) in a hand or foot.  Your hand or foot turns pale or cold.   This information is not intended to replace advice given to you by your health care provider. Make sure you discuss any questions you have with your health care provider.   Document Released: 01/12/2008 Document Revised: 04/16/2015 Document Reviewed: 12/11/2014 Elsevier Interactive Patient Education 2016 [employer]  Poison [employer] Poison ivy is a inflammation of the skin (contact dermatitis) caused by touching the allergens on the leaves of the ivy plant following previous exposure to the plant. The rash usually appears 48 hours after exposure. The rash is usually bumps (papules) or blisters (vesicles) in a linear pattern. Depending on your own sensitivity, the rash may simply cause redness and itching, or it may also progress to blisters which may break open. These must be well cared for to prevent secondary  bacterial (germ) infection, followed by scarring. Keep any open areas dry, clean, dressed, and covered with an antibacterial ointment if needed. The eyes may also get puffy. The puffiness is worst in the morning and gets better as the day progresses. This dermatitis usually heals without scarring, within 2 to 3 weeks without treatment. HOME CARE INSTRUCTIONS  Thoroughly wash with soap and water as soon as you have been exposed to poison ivy. You have about one half hour to remove the plant resin before it will cause the rash. This washing will destroy the oil or antigen on the skin that is causing, or will cause, the rash. Be sure to wash under your fingernails as any plant resin there will continue to spread the rash. Do not rub skin vigorously when washing affected area. Poison ivy cannot spread if no oil from the plant remains on your body. A rash that has progressed to weeping sores will not spread the rash unless you have not washed thoroughly. It is also important to wash any clothes you have been wearing as these may carry active allergens. The rash will return if you wear the unwashed clothing, even several days later. Avoidance of the plant in the future is the best measure. Poison ivy plant can be recognized by the number of leaves. Generally, poison ivy has three leaves with flowering branches on a single stem. Diphenhydramine may be purchased over the counter and used as needed for itching. Do not drive with this  medication if it makes you drowsy.Ask your caregiver about medication for children. SEEK MEDICAL CARE IF:  Open sores develop.  Redness spreads beyond area of rash.  You notice purulent (pus-like) discharge.  You have increased pain.  Other signs of infection develop (such as fever).   This information is not intended to replace advice given to you by your health care provider. Make sure you discuss any questions you have with your health care provider.   Document Released:  07/23/2000 Document Revised: 10/18/2011 Document Reviewed: 01/01/2015 Elsevier Interactive Patient Education [employer]
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
## 2015-12-26 ENCOUNTER — Emergency Department (HOSPITAL_COMMUNITY): Payer: Self-pay

## 2015-12-26 ENCOUNTER — Emergency Department (HOSPITAL_COMMUNITY)
Admission: EM | Admit: 2015-12-26 | Discharge: 2015-12-26 | Disposition: A | Discharge status: Home / Self Care | Payer: Self-pay | Attending: Emergency Medicine | Admitting: Emergency Medicine

## 2015-12-26 ENCOUNTER — Encounter (HOSPITAL_COMMUNITY): Payer: Self-pay | Admitting: *Deleted

## 2015-12-26 DIAGNOSIS — S8392XA Sprain of unspecified site of left knee, initial encounter: Secondary | ICD-10-CM | POA: Insufficient documentation

## 2015-12-26 DIAGNOSIS — Y999 Unspecified external cause status: Secondary | ICD-10-CM | POA: Insufficient documentation

## 2015-12-26 DIAGNOSIS — W228XXA Striking against or struck by other objects, initial encounter: Secondary | ICD-10-CM | POA: Insufficient documentation

## 2015-12-26 DIAGNOSIS — F17210 Nicotine dependence, cigarettes, uncomplicated: Secondary | ICD-10-CM | POA: Insufficient documentation

## 2015-12-26 DIAGNOSIS — Y929 Unspecified place or not applicable: Secondary | ICD-10-CM | POA: Insufficient documentation

## 2015-12-26 DIAGNOSIS — Y9389 Activity, other specified: Secondary | ICD-10-CM | POA: Insufficient documentation

## 2015-12-26 MED ORDER — IBUPROFEN 800 MG PO TABS: 800.0000 mg | ORAL_TABLET | Freq: Three times a day (TID) | ORAL | Status: DC

## 2015-12-26 MED ORDER — HYDROCODONE-ACETAMINOPHEN 5-325 MG PO TABS
1.0000 | ORAL_TABLET | Freq: Once | ORAL | Status: AC
Start: 2015-12-26 — End: 2015-12-26
  Administered 2015-12-26: 1 via ORAL
  Filled 2015-12-26: qty 1

## 2015-12-26 MED ORDER — HYDROCODONE-ACETAMINOPHEN 5-325 MG PO TABS: ORAL_TABLET | ORAL | Status: DC

## 2015-12-26 NOTE — Discharge Instructions (Signed)
Knee Sprain °A knee sprain is a tear in the strong bands of tissue that connect the bones (ligaments) of your knee. °HOME CARE °· Raise (elevate) your injured knee to lessen puffiness (swelling). °· To ease pain and puffiness, put ice on the injured area. °¨ Put ice in a plastic bag. °¨ Place a towel between your skin and the bag. °¨ Leave the ice on for 20 minutes, 2-3 times a day. °· Only take medicine as told by your doctor. °· Do not leave your knee unprotected until pain and stiffness go away (usually 4-6 weeks). °· If you have a cast or splint, do not get it wet. If your doctor told you to not take it off, cover it with a plastic bag when you shower or bathe. Do not swim. °· Your doctor may have you do exercises to prevent or limit permanent weakness and stiffness. °GET HELP RIGHT AWAY IF:  °· Your cast or splint becomes damaged. °· Your pain gets worse. °· You have a lot of pain, puffiness, or numbness below the cast or splint. °MAKE SURE YOU:  °· Understand these instructions. °· Will watch your condition. °· Will get help right away if you are not doing well or get worse. °  °This information is not intended to replace advice given to you by your health care provider. Make sure you discuss any questions you have with your health care provider. °  °Document Released: 07/14/2009 Document Revised: 07/31/2013 Document Reviewed: 04/03/2013 °Elsevier Interactive Patient Education ©2016 Elsevier Inc. ° °

## 2015-12-26 NOTE — ED Notes (Signed)
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 Pt can move his toes and foot with no difficulties noted.

## 2015-12-29 NOTE — ED Provider Notes (Signed)
CSN: [id number]     Arrival date & time 12/26/15  1247 History   First MD Initiated Contact with Patient 12/26/15 1404     No chief complaint on file.    (Consider location/radiation/quality/duration/timing/severity/associated sxs/prior Treatment) HPI   Ryan Kelley is a 30 y.o. male who presents to the Emergency Department complaining of left knee pain that began several hours prior to arrival.  He states that he was helping someone cut down trees when a log struck his outer left knee and slid down his shin.  He complains of pain to the knee with bending and walking.  Pain improves at rest.  He has abrasions to his lower leg as well.  He have not tried any medications for symptomatic relief.  He denies swelling, numbness or weakness of the lower extremity, Td is up to date.     Past Medical History  Diagnosis Date  . Substance abuse   . PTSD (post-traumatic stress disorder)   . Attempted suicide (HCC)   . Anxiety    History reviewed. No pertinent past surgical history. Family History  Problem Relation Age of Onset  . Diabetes Mother   . Cancer Mother   . Hypertension Mother   . Hypertension Father    Social History  Substance Use Topics  . Smoking status: Current Every Day Smoker -- 1.00 packs/day for 7 years    Types: Cigarettes  . Smokeless tobacco: None  . Alcohol Use: 0.0 oz/week     Comment: once a week, occasional use    Review of Systems  Constitutional: Negative for fever and chills.  Gastrointestinal: Negative for nausea, vomiting and abdominal pain.  Musculoskeletal: Positive for arthralgias (left knee pain). Negative for neck pain.  Skin: Positive for wound (abrasion left lower leg). Negative for color change.  Neurological: Negative for syncope, weakness and numbness.  All other systems reviewed and are negative.     Allergies  Review of patient's allergies indicates no known allergies.  Home Medications   Prior to Admission medications   Medication  Sig Start Date End Date Taking? Authorizing Provider  HYDROcodone-acetaminophen (NORCO/VICODIN) 5-325 MG tablet Take one tab po q 4-6 hrs prn pain 12/26/15   Euan Wandler, PA-C  ibuprofen (ADVIL,MOTRIN) 800 MG tablet Take 1 tablet (800 mg total) by mouth 3 (three) times daily. 12/26/15   [place] Throckmorton, PA-C   BP 145/76 mmHg  Pulse 88  Temp(Src) 98.2 F (36.8 C) (Oral)  Resp 16  Ht 5\' 5"  (1.651 m)  Wt 97.523 kg  BMI 35.78 kg/m2  SpO2 99% Physical Exam  Constitutional: He is oriented to person, place, and time. He appears well-developed and well-nourished. No distress.  HENT:  Head: Atraumatic.  Cardiovascular: Normal rate, regular rhythm and intact distal pulses.   Pulmonary/Chest: Effort normal and breath sounds normal.  Musculoskeletal: He exhibits tenderness.  Diffuse ttp of the anterior left knee.  No erythema, effusion, or step-off deformity.  DP pulse brisk, distal sensation intact. Compartments soft.   Neurological: He is alert and oriented to person, place, and time. He exhibits normal muscle tone. Coordination normal.  Skin: Skin is warm and dry. No erythema.  Two superficial abrasions to the left lower leg.    Nursing note and vitals reviewed.   ED Course  Procedures (including critical care time) Labs Review Labs Reviewed - No data to display  Imaging Review Dg Tibia/fibula Left  12/26/2015  CLINICAL DATA:  Struck in knee with an impact socket while cutting down  trees and moving logs, then a log rolled into knee and down front of lower leg pinning it, anterior pain at knee and lower leg, initial encounter EXAM: LEFT TIBIA AND FIBULA - 2 VIEW COMPARISON:  LEFT ankle radiographs 02/05/2014 FINDINGS: Osseous mineralization normal. Joint spaces preserved. No fracture, dislocation, or bone destruction. IMPRESSION: No acute osseous abnormalities. Electronically Signed   By: Ulyses Southward M.D.   On: 12/26/2015 13:30   Dg Knee Complete 4 Views Left  12/26/2015  CLINICAL DATA:   Struck in knee with an impact socket while cutting down trees and moving logs, then a log rolled into knee and down front of lower leg pinning it, anterior pain at knee and lower leg, initial encounter EXAM: LEFT KNEE - COMPLETE 4+ VIEW COMPARISON:  None FINDINGS: Osseous mineralization normal. Joint spaces preserved. No acute fracture, dislocation, or bone destruction. No knee joint effusion. IMPRESSION: No acute osseous abnormalities. Electronically Signed   By: Ulyses Southward M.D.   On: 12/26/2015 13:29    I have personally reviewed and evaluated these images and lab results as part of my medical decision-making.   EKG Interpretation None      MDM   Final diagnoses:  Knee sprain, left, initial encounter    Knee pain without effusion.  XR neg.  No concerning sx's for septic joint. NV intact.  Compartments soft.  Discussed possible ligamentous injury and importance of close ortho f/u   Knee immob applied.  Pt agrees to close orthopedic f/u.    Abrasions cleaned and bandaged.      Pauline Aus, PA-C 12/29/15 1629  Lavera Guise, MD 12/29/15 2049

## 2016-02-23 ENCOUNTER — Emergency Department (HOSPITAL_COMMUNITY)
Admission: EM | Admit: 2016-02-23 | Discharge: 2016-02-23 | Disposition: A | Discharge status: Home / Self Care | Payer: Self-pay | Attending: Emergency Medicine | Admitting: Emergency Medicine

## 2016-02-23 ENCOUNTER — Encounter (HOSPITAL_COMMUNITY): Payer: Self-pay | Admitting: Emergency Medicine

## 2016-02-23 DIAGNOSIS — L237 Allergic contact dermatitis due to plants, except food: Secondary | ICD-10-CM | POA: Insufficient documentation

## 2016-02-23 DIAGNOSIS — F17210 Nicotine dependence, cigarettes, uncomplicated: Secondary | ICD-10-CM | POA: Insufficient documentation

## 2016-02-23 MED ORDER — BETAMETHASONE SOD PHOS & ACET 6 (3-3) MG/ML IJ SUSP: 6.0000 mg | Freq: Once | INTRAMUSCULAR | Status: DC

## 2016-02-23 MED ORDER — METHYLPREDNISOLONE SODIUM SUCC 125 MG IJ SOLR
60.0000 mg | Freq: Once | INTRAMUSCULAR | Status: AC
  Administered 2016-02-23: 60 mg via INTRAMUSCULAR
  Filled 2016-02-23: qty 2

## 2016-02-23 MED ORDER — PREDNISONE 10 MG PO TABS: 20.0000 mg | ORAL_TABLET | Freq: Two times a day (BID) | ORAL | Status: DC

## 2016-02-23 NOTE — ED Notes (Signed)
Patient complaining of rash to left eye and bilateral arms after being exposed to poison ivy yesterday.

## 2016-02-23 NOTE — ED Provider Notes (Signed)
CSN: [id number]     Arrival date & time 02/23/16  1324 History  By signing my name below, I, Alyssa GroveMartin Green, attest that this documentation has been prepared under the direction and in the presence of Kerrie BuffaloHope Neese, NP. Electronically Signed: Alyssa GroveMartin Green, ED Scribe. 02/23/2016. 1:59 PM.   Chief Complaint  Patient presents with  . Rash   The history is provided by the patient. No language interpreter was used.    HPI Comments: Ryan Kelley is a 30 y.o. male who presents to the Emergency Department complaining of multiple areas of erythema located on his hands bilaterally and left forearm onset 2 days. Pt states he was moving harvested lumber and noticed vines on the wood 2 days ago. He states he woke up yesterday with a small rash in between his fingers on his left hand which then spread to his forearms and eyelid by the next morning. Pt states he has used bleach to dry out the area and calamine lotion to treat the areas. Pt reports he is allergic to poison ivy. Pt denies throat swelling, shortness of breath, and wheezing.   Past Medical History  Diagnosis Date  . Substance abuse   . PTSD (post-traumatic stress disorder)   . Attempted suicide (HCC)   . Anxiety    History reviewed. No pertinent past surgical history. Family History  Problem Relation Age of Onset  . Diabetes Mother   . Cancer Mother   . Hypertension Mother   . Hypertension Father    Social History  Substance Use Topics  . Smoking status: Current Every Day Smoker -- 1.00 packs/day for 7 years    Types: Cigarettes  . Smokeless tobacco: None  . Alcohol Use: 0.0 oz/week     Comment: once a week, occasional use    Review of Systems  HENT:       - Throat swelling  Respiratory: Negative for shortness of breath and wheezing.   Skin: Positive for color change and rash.    Allergies  Poison ivy extract  Home Medications   Prior to Admission medications   Medication Sig Start Date End Date Taking? Authorizing Provider   predniSONE (DELTASONE) 10 MG tablet Take 2 tablets (20 mg total) by mouth 2 (two) times daily with a meal. 02/23/16   Hope Orlene OchM Neese, NP   BP 142/64 mmHg  Pulse 93  Temp(Src) 98.1 F (36.7 C) (Oral)  Resp 16  Ht 5\' 5"  (1.651 m)  Wt 90.719 kg  BMI 33.28 kg/m2  SpO2 100% Physical Exam  Constitutional: He appears well-developed and well-nourished. No distress.  HENT:  Head: Normocephalic.  Eyes: Conjunctivae are normal.  Cardiovascular: Normal rate.   Pulmonary/Chest: Effort normal. No respiratory distress.  Abdominal: He exhibits no distension.  Musculoskeletal: Normal range of motion.  Neurological: He is alert.  Skin: Skin is warm and dry. Rash noted. There is erythema.  Linear vesicular lesions to the left hand and forearm, right hand, and a few lesions noted to the left upper eyelid  Psychiatric: He has a normal mood and affect. His behavior is normal.  Nursing note and vitals reviewed.   ED Course  Procedures (including critical care time)  DIAGNOSTIC STUDIES: Oxygen Saturation is 100% on RA, normal by my interpretation.    COORDINATION OF CARE: 1:56 PM Discussed treatment plan with pt at bedside which includes Prednisone and pt agreed to plan.  Solumedrol 60 mg IM  MDM  30 y.o. male with allergic dermitis stable for d/c  without respiratory, difficulty swallowing or fever and no signs of infection. He will take Benadryl for itching, continue calamine lotion and return for worsening symptoms.   Final diagnoses:  Allergic dermatitis due to poison oak    I personally performed the services described in this documentation, which was scribed in my presence. The recorded information has been reviewed and is accurate.    [place], [place] 02/23/16 1649  Blane Ohara, MD 02/24/16 1536

## 2016-02-23 NOTE — Discharge Instructions (Signed)
Continue to use Calamine lotion, take Benadryl as needed for itching and stay as cool as possible.

## 2016-05-26 ENCOUNTER — Emergency Department (HOSPITAL_COMMUNITY)
Admission: EM | Admit: 2016-05-26 | Discharge: 2016-05-26 | Disposition: A | Discharge status: Home / Self Care | Payer: Self-pay | Attending: Emergency Medicine | Admitting: Emergency Medicine

## 2016-05-26 ENCOUNTER — Encounter (HOSPITAL_COMMUNITY): Payer: Self-pay

## 2016-05-26 DIAGNOSIS — Z79899 Other long term (current) drug therapy: Secondary | ICD-10-CM | POA: Insufficient documentation

## 2016-05-26 DIAGNOSIS — M25561 Pain in right knee: Secondary | ICD-10-CM | POA: Insufficient documentation

## 2016-05-26 DIAGNOSIS — G8929 Other chronic pain: Secondary | ICD-10-CM | POA: Insufficient documentation

## 2016-05-26 DIAGNOSIS — F17210 Nicotine dependence, cigarettes, uncomplicated: Secondary | ICD-10-CM | POA: Insufficient documentation

## 2016-05-26 MED ORDER — NAPROXEN 500 MG PO TABS: 500.0000 mg | ORAL_TABLET | Freq: Two times a day (BID) | ORAL | 1 refills | Status: DC

## 2016-05-26 MED ORDER — ACETAMINOPHEN 325 MG PO TABS
650.0000 mg | ORAL_TABLET | Freq: Once | ORAL | Status: AC
  Administered 2016-05-26: 650 mg via ORAL
  Filled 2016-05-26: qty 2

## 2016-05-26 NOTE — ED Provider Notes (Signed)
[facility] DE[facility] Provider Note   CSN: [id number] Arrival date & time: 05/26/16  1034     History   Chief Complaint Chief Complaint  Patient presents with  . Knee Pain    HPI Ryan Kelley is a 30 y.o. male.  Ryan Kelley is a 30 y.o. Male who presents to the ED complaining of ongoing right knee pain. The patient reports he injured his right knee one year ago and was seen in the emergency department for x-rays. He reports that in 1 month ago he fell on it again and was again seen in the emergency department for x-rays. His most recent x-ray showed no acute abnormality. He reports ongoing pain to the medial aspect of his right knee. Been taking ibuprofen with little relief. He has not followed up with primary care or orthopedic surgery. He denies new injury or trauma since his most recent x-ray one month ago. He reports his pain is worse with weightbearing and range of motion. He denies fevers, numbness, tingling, weakness, swelling, rashes, or new injury.   The history is provided by the patient and medical records. No language interpreter was used.  Knee Pain   Pertinent negatives include no numbness.    Past Medical History:  Diagnosis Date  . Anxiety   . Attempted suicide   . PTSD (post-traumatic stress disorder)   . Substance abuse     Patient Active Problem List   Diagnosis Date Noted  . Polysubstance (including opioids) dependence with physiological dependence (HCC) 12/06/2013  . Substance induced mood disorder (HCC) 12/05/2013  . Opioid dependence (HCC) 10/18/2012  . Opioid use with withdrawal (HCC) 10/18/2012  . PTSD (post-traumatic stress disorder) 10/18/2012    History reviewed. No pertinent surgical history.     Home Medications    Prior to Admission medications   Medication Sig Start Date End Date Taking? Authorizing Provider  naproxen (NAPROSYN) 500 MG tablet Take 1 tablet (500 mg total) by mouth 2 (two) times daily with a meal. 05/26/16   Everlene FarrierWilliam  Binnie Droessler, PA-C  predniSONE (DELTASONE) 10 MG tablet Take 2 tablets (20 mg total) by mouth 2 (two) times daily with a meal. 02/23/16   Hope Orlene OchM Neese, NP    Family History Family History  Problem Relation Age of Onset  . Diabetes Mother   . Cancer Mother   . Hypertension Mother   . Hypertension Father     Social History Social History  Substance Use Topics  . Smoking status: Current Every Day Smoker    Packs/day: 1.00    Years: 7.00    Types: Cigarettes  . Smokeless tobacco: Never Used  . Alcohol use No     Allergies   Poison ivy extract [poison ivy extract]   Review of Systems Review of Systems  Constitutional: Negative for fever.  Cardiovascular: Negative for leg swelling.  Musculoskeletal: Positive for arthralgias. Negative for joint swelling.  Skin: Negative for rash and wound.  Neurological: Negative for weakness and numbness.     Physical Exam Updated Vital Signs BP 95/81 (BP Location: Left Arm)   Pulse 73   Temp 98.2 F (36.8 C) (Oral)   Resp 16   Ht 5\' 5"  (1.651 m)   Wt 90.7 kg   SpO2 100%   BMI 33.28 kg/m   Physical Exam  Constitutional: He appears well-developed and well-nourished. No distress.  HENT:  Head: Normocephalic and atraumatic.  Eyes: Right eye exhibits no discharge. Left eye exhibits no discharge.  Cardiovascular: Normal  rate, regular rhythm and intact distal pulses.   Bilateral dorsalis pedis and posterior tibialis pulses are intact. Good capillary refill to his bilateral toes.  Pulmonary/Chest: Effort normal. No respiratory distress.  Musculoskeletal: Normal range of motion. He exhibits tenderness. He exhibits no edema or deformity.  TTP to the medial aspect of his right knee. No knee deformity, edema, ecchymosis or warmth. No knee laxity noted. No calf edema or tenderness. He is able to flex and extend his leg at his knee.   Neurological: He is alert. Coordination normal.  Sensation is intact in his bilateral lower extremities.  Skin:  Skin is warm and dry. Capillary refill takes less than 2 seconds. No rash noted. He is not diaphoretic. No erythema. No pallor.  Psychiatric: He has a normal mood and affect. His behavior is normal.  Nursing note and vitals reviewed.    ED Treatments / Results  Labs (all labs ordered are listed, but only abnormal results are displayed) Labs Reviewed - No data to display  EKG  EKG Interpretation None       Radiology No results found.  Procedures Procedures (including critical care time)  Medications Ordered in ED Medications  acetaminophen (TYLENOL) tablet 650 mg (not administered)     Initial Impression / Assessment and Plan / ED Course  I have reviewed the triage vital signs and the nursing notes.  Pertinent labs & imaging results that were available during my care of the patient were reviewed by me and considered in my medical decision making (see chart for details).  Clinical Course    This is a 30 y.o. Male who presents to the ED complaining of ongoing right knee pain. The patient reports he injured his right knee one year ago and was seen in the emergency department for x-rays. He reports that in 1 month ago he fell on it again and was again seen in the emergency department for x-rays. His most recent x-ray showed no acute abnormality. He reports ongoing pain to the medial aspect of his right knee. Been taking ibuprofen with little relief. He has not followed up with primary care or orthopedic surgery. He denies new injury or trauma since his most recent x-ray one month ago. He reports his pain is worse with weightbearing and range of motion. On exam he is afebrile nontoxic appearing. He has some mild tenderness to the medial aspect of his right knee. No knee edema, deformity or ecchymosis. No lower extremity edema or tenderness. No knee laxity noted. He is neurovascularly intact. The patient has had 2 x-rays recently. No injuries since his most recent x-ray. Last x-ray  was unremarkable. I see no need for further imaging at this time. Patient needs follow-up with orthopedic surgeon. Will provide him with a knee sleeve and crutches and have him follow-up with orthopedic surgeon Dr. Romeo Apple. Naproxen and ice for pain control. I advised the patient to follow-up with their primary care provider this week. I advised the patient to return to the emergency department with new or worsening symptoms or new concerns. The patient verbalized understanding and agreement with plan.      Final Clinical Impressions(s) / ED Diagnoses   Final diagnoses:  Chronic pain of right knee    New Prescriptions New Prescriptions   NAPROXEN (NAPROSYN) 500 MG TABLET    Take 1 tablet (500 mg total) by mouth 2 (two) times daily with a meal.     Everlene Farrier, PA-C 05/26/16 1153    Samuel Jester, DO  05/27/16 0900  

## 2016-05-26 NOTE — ED Triage Notes (Signed)
Pt reports bilateral knee pain and right knee is worse for one month. No swelling. Pain is worse at hs

## 2016-05-26 NOTE — ED Notes (Signed)
Pt refused knee sleeve and crutches, says he has them at home.

## 2016-07-07 ENCOUNTER — Emergency Department (HOSPITAL_COMMUNITY)
Admission: EM | Admit: 2016-07-07 | Discharge: 2016-07-08 | Disposition: A | Discharge status: Other Acute Inpatient Hospital | Payer: Self-pay | Attending: Emergency Medicine | Admitting: Emergency Medicine

## 2016-07-07 ENCOUNTER — Encounter (HOSPITAL_COMMUNITY): Payer: Self-pay | Admitting: *Deleted

## 2016-07-07 DIAGNOSIS — Z79899 Other long term (current) drug therapy: Secondary | ICD-10-CM | POA: Insufficient documentation

## 2016-07-07 DIAGNOSIS — F329 Major depressive disorder, single episode, unspecified: Secondary | ICD-10-CM | POA: Insufficient documentation

## 2016-07-07 DIAGNOSIS — F1910 Other psychoactive substance abuse, uncomplicated: Secondary | ICD-10-CM | POA: Insufficient documentation

## 2016-07-07 DIAGNOSIS — F17210 Nicotine dependence, cigarettes, uncomplicated: Secondary | ICD-10-CM | POA: Insufficient documentation

## 2016-07-07 DIAGNOSIS — R45851 Suicidal ideations: Secondary | ICD-10-CM

## 2016-07-07 DIAGNOSIS — F1290 Cannabis use, unspecified, uncomplicated: Secondary | ICD-10-CM | POA: Insufficient documentation

## 2016-07-07 DIAGNOSIS — F32A Depression, unspecified: Secondary | ICD-10-CM

## 2016-07-07 HISTORY — DX: Dorsalgia, unspecified: M54.9

## 2016-07-07 HISTORY — DX: Other chronic pain: G89.29

## 2016-07-07 HISTORY — DX: Insomnia, unspecified: G47.00

## 2016-07-07 HISTORY — DX: Pain in unspecified knee: M25.569

## 2016-07-07 HISTORY — DX: Major depressive disorder, single episode, unspecified: F32.9

## 2016-07-07 HISTORY — DX: Depression, unspecified: F32.A

## 2016-07-07 LAB — COMPREHENSIVE METABOLIC PANEL
ALBUMIN: 4.3 g/dL (ref 3.5–5.0)
ALK PHOS: 67 U/L (ref 38–126)
ALT: 25 U/L (ref 17–63)
ANION GAP: 10 (ref 5–15)
AST: 28 U/L (ref 15–41)
BILIRUBIN TOTAL: 0.8 mg/dL (ref 0.3–1.2)
BUN: 18 mg/dL (ref 6–20)
CO2: 23 mmol/L (ref 22–32)
Calcium: 8.9 mg/dL (ref 8.9–10.3)
Chloride: 103 mmol/L (ref 101–111)
Creatinine, Ser: 0.73 mg/dL (ref 0.61–1.24)
GLUCOSE: 88 mg/dL (ref 65–99)
POTASSIUM: 3.4 mmol/L — AB (ref 3.5–5.1)
Sodium: 136 mmol/L (ref 135–145)
Total Protein: 7 g/dL (ref 6.5–8.1)

## 2016-07-07 LAB — CBC WITH DIFFERENTIAL/PLATELET
Basophils Absolute: 0.1 10*3/uL (ref 0.0–0.1)
Basophils Relative: 1 %
EOS PCT: 5 %
Eosinophils Absolute: 0.7 10*3/uL (ref 0.0–0.7)
HEMATOCRIT: 45.1 % (ref 39.0–52.0)
HEMOGLOBIN: 16.1 g/dL (ref 13.0–17.0)
LYMPHS ABS: 3.5 10*3/uL (ref 0.7–4.0)
LYMPHS PCT: 27 %
MCH: 31.9 pg (ref 26.0–34.0)
MCHC: 35.7 g/dL (ref 30.0–36.0)
MCV: 89.5 fL (ref 78.0–100.0)
MONO ABS: 0.6 10*3/uL (ref 0.1–1.0)
Monocytes Relative: 4 %
Neutro Abs: 8.3 10*3/uL — ABNORMAL HIGH (ref 1.7–7.7)
Neutrophils Relative %: 63 %
Platelets: 272 10*3/uL (ref 150–400)
RBC: 5.04 MIL/uL (ref 4.22–5.81)
RDW: 12.8 % (ref 11.5–15.5)
WBC: 13.1 10*3/uL — AB (ref 4.0–10.5)

## 2016-07-07 LAB — RAPID URINE DRUG SCREEN, HOSP PERFORMED
AMPHETAMINES: NOT DETECTED
BENZODIAZEPINES: POSITIVE — AB
Barbiturates: NOT DETECTED
COCAINE: POSITIVE — AB
Opiates: NOT DETECTED
TETRAHYDROCANNABINOL: POSITIVE — AB

## 2016-07-07 LAB — ACETAMINOPHEN LEVEL: Acetaminophen (Tylenol), Serum: <10 ug/mL — ABNORMAL LOW (ref 10–30)

## 2016-07-07 LAB — SALICYLATE LEVEL: Salicylate Lvl: <7 mg/dL (ref 2.8–30.0)

## 2016-07-07 LAB — MAGNESIUM: Magnesium: 2 mg/dL (ref 1.7–2.4)

## 2016-07-07 LAB — ETHANOL: Alcohol, Ethyl (B): <5 mg/dL (ref <5)

## 2016-07-07 MED ORDER — ESCITALOPRAM OXALATE 10 MG PO TABS
ORAL_TABLET | ORAL | Status: AC
  Filled 2016-07-07: qty 1

## 2016-07-07 MED ORDER — ESCITALOPRAM OXALATE 10 MG PO TABS
10.0000 mg | ORAL_TABLET | Freq: Every day | ORAL | Status: DC
  Administered 2016-07-07: 10 mg via ORAL
  Filled 2016-07-07 (×3): qty 1

## 2016-07-07 MED ORDER — IBUPROFEN 400 MG PO TABS: 600.0000 mg | ORAL_TABLET | Freq: Three times a day (TID) | ORAL | Status: DC | PRN

## 2016-07-07 MED ORDER — GABAPENTIN 800 MG PO TABS
800.0000 mg | ORAL_TABLET | Freq: Three times a day (TID) | ORAL | Status: DC
  Administered 2016-07-07: 800 mg via ORAL
  Filled 2016-07-07 (×5): qty 1

## 2016-07-07 MED ORDER — ACETAMINOPHEN 325 MG PO TABS: 650.0000 mg | ORAL_TABLET | ORAL | Status: DC | PRN

## 2016-07-07 MED ORDER — NICOTINE 21 MG/24HR TD PT24
21.0000 mg | MEDICATED_PATCH | Freq: Every day | TRANSDERMAL | Status: DC | PRN
  Filled 2016-07-07: qty 1

## 2016-07-07 MED ORDER — GABAPENTIN 300 MG PO CAPS
ORAL_CAPSULE | ORAL | Status: AC
  Filled 2016-07-07: qty 2

## 2016-07-07 MED ORDER — GABAPENTIN 100 MG PO CAPS
ORAL_CAPSULE | ORAL | Status: AC
  Filled 2016-07-07: qty 2

## 2016-07-07 MED ORDER — LORAZEPAM 1 MG PO TABS
1.0000 mg | ORAL_TABLET | Freq: Three times a day (TID) | ORAL | Status: DC | PRN
Start: 2016-07-07 — End: 2016-07-08

## 2016-07-07 MED ORDER — TRAZODONE HCL 50 MG PO TABS
100.0000 mg | ORAL_TABLET | Freq: Every day | ORAL | Status: DC
  Administered 2016-07-07: 100 mg via ORAL
  Filled 2016-07-07: qty 2

## 2016-07-07 NOTE — ED Notes (Signed)
Spoke with PC at this time recommends to repeat another EKG in 2 hours due to QRSD increase of 4 points. If QRSD >120 recommends bicarb bolus. MD Clarene DukeMcManus notified.

## 2016-07-07 NOTE — ED Notes (Signed)
Pt given meal

## 2016-07-07 NOTE — BH Assessment (Addendum)
Tele Assessment Note   Ryan Kelley is an 30 y.o. male who came to the [facility] voluntarily tonight c/o SI with a plan to OD on Opioids which he uses daily. Pt denies HI, SHI and AVH. Pt sts that he began to feel increasingly depressed about 2 days ago. Pt denies any triggers for increase. Pt sts that yesterday, he told his employer that he needed help for his drug use and quit his job. Pt sts that he planned to OD on heroin which he uses daily along with other opioids (Tramadol, Hydrocodone, Oxycodone) if possible. Pt sts "I use as much as I can afford." Pt also uses cannabis daily, alcohol about 1 x month and smokes about 1 pack of cigarettes daily. Pt is prescribed a number of medications including benzodiazepines (Klonopin) and sts he is complaint on all except he has not taken his prescribed anti-depressant "in a few weeks." Pt gave no reason for stopping. Pt has made one suicide attempt in 2015 by OD. Pt is currently seeing [facility] for medication management and OPT. Pt sts he has been seen there for "a few months." Pt sts that his only real stress is his drug use. Pt's symptoms of depression including sadness, fatigue, excessive guilt, decreased self esteem, tearfulness / crying spells, self isolation, lack of motivation for activities and pleasure, irritability, negative outlook, difficulty thinking & concentrating, feeling helpless and hopeless, sleep and eating disturbances. Pt sts he sleeps about 5-6 hours per night and has gained over 10 pounds in the last two months.   Pt sts he lives with his mother and stepfather. Pt sts he is single and was working for a [employer] until yesterday. Pt has been psychiatrically hospitalized 3 times (2006, 2014, 2015) [facility]. Pt has previously been diagnosed with Depression, ANxiety, PTSD, Polysubstance abuse and Substance-Induced Mood D/O. Pt was diagnosed with PTSD after he witnessed his brother die in 2006 of an unintentional OD. Pt denies any  issues with anger outburst or aggression. Pt denies any hx of violent crime but has been arrested for B&E and Larceny about 10 years ago. Pt denies access to guns. Pt's family hx is significant for depression and anxiety in his mother and aunt both. Pt denies any hx of abuse: physical, verbal or sexual.   Pt was dressed in scrubs. Pt was alert, cooperative and pleasant. Pt kept good eye contact, spoke in a clear tone and at a normal pace. Pt moved in a normal manner when moving. Pt's thought process was coherent and relevant and judgement was impaired.  No indication of delusional thinking or response to internal stimuli. Pt's mood was stated to be depressed and somewhat anxious and his blunted affect was congruent.  Pt was oriented x 4, to person, place, time and situation.   Diagnosis: Substance Induced Mood D/O by hx; PTSD by hx; GAD by hx; Opioid Use D/O, Severe; Cannabis Use D/O, Severe  Past Medical History:  Past Medical History:  Diagnosis Date  . Anxiety   . Attempted suicide   . Chronic back pain   . Chronic knee pain   . Depression   . Insomnia   . PTSD (post-traumatic stress disorder)   . Substance abuse     History reviewed. No pertinent surgical history.  Family History:  Family History  Problem Relation Age of Onset  . Diabetes Mother   . Cancer Mother   . Hypertension Mother   . Hypertension Father  Social History:  reports that he has been smoking Cigarettes.  He has a 7.00 pack-year smoking history. He has never used smokeless tobacco. He reports that he drinks alcohol. He reports that he uses drugs, including Marijuana, Hydrocodone, Oxycodone, and Benzodiazepines, about 5 times per week.  Additional Social History:  Alcohol / Drug Use Prescriptions: see MAR History of alcohol / drug use?: Yes Longest period of sobriety (when/how long): unknown Substance #1 Name of Substance 1: Opioids: Heroin, Hydrocodone, Oxycodone, Tramadol 1 - Age of First Use: 11 1 -  Amount (size/oz): "as much as I can afford" 1 - Frequency: daily 1 - Duration: ongoing 1 - Last Use / Amount: 07/07/16 Substance #2 Name of Substance 2: Tobacco/Nicotine/Cigarettes 2 - Age of First Use: 11 2 - Amount (size/oz): 1 pack 2 - Frequency: daily 2 - Duration: ongoing 2 - Last Use / Amount: 07/07/16 Substance #3 Name of Substance 3: Alcohol 3 - Age of First Use: 11 3 - Amount (size/oz): "usually a couple of beers" 3 - Frequency: about 1 x month 3 - Duration: ongoing 3 - Last Use / Amount: last week Substance #4 Name of Substance 4: Cocaine (tested positive tonight) 4 - Age of First Use: 10 4 - Amount (size/oz): $20 worth 4 - Frequency: "If I can every day" - sts recently started using 4 - Duration: few months 4 - Last Use / Amount: 07/06/16 Substance #5 Name of Substance 5: Benzodiazepines: RX for Klonopin currently has used Xanax in the past w/o RX 5 - Age of First Use: 10 5 - Amount (size/oz): sts only Klonopin as prescribed 5 - Frequency: daily 5 - Duration: years/ongoing 5 - Last Use / Amount: 07/07/16 Substance #6 Name of Substance 6: Cannabis 6 - Age of First Use: 10 6 - Amount (size/oz): 1/2 gm 6 - Frequency: daily 6 - Duration: years/ongoing 6 - Last Use / Amount: 07/06/16  CIWA: CIWA-Ar BP: 117/63 Pulse Rate: 78 COWS:    PATIENT STRENGTHS: (choose at least two) Average or above average intelligence Capable of independent living Supportive family/friends  Allergies:  Allergies  Allergen Reactions  . Poison Ivy Extract [Poison Ivy Extract]     Home Medications:  (Not in a hospital admission)  OB/GYN Status:  No LMP for male patient.  General Assessment Data Location of Assessment: [facility] TTS Assessment: In system Is this a Tele or Face-to-Face Assessment?: Tele Assessment Is this an Initial Assessment or a Re-assessment for this encounter?: Initial Assessment Marital status: Single Maiden name:  (na) Is patient pregnant?:  No Pregnancy Status: No Living Arrangements: Parent (lives w mom & stepdad) Can pt return to current living arrangement?: Yes Admission Status: Voluntary Is patient capable of signing voluntary admission?: Yes Referral Source: Self/Family/Friend Insurance type:  (Self Pay)  Medical Screening Exam [facility] Walk-in ONLY) Medical Exam completed: Yes  Crisis Care Plan Living Arrangements: Parent (lives w mom & stepdad) Legal Guardian:  (self) Name of Psychiatrist:  ([facility]) Name of Therapist:  ([facility])  Education Status Is patient currently in school?: No Highest grade of school patient has completed:  (graduated high school; attended some college)  Risk to self with the past 6 months Suicidal Ideation: Yes-Currently Present Has patient been a risk to self within the past 6 months prior to admission? : No Suicidal Intent: No (denies) Has patient had any suicidal intent within the past 6 months prior to admission? : No Is patient at risk for suicide?: No Suicidal Plan?:  Yes-Currently Present Has patient had any suicidal plan within the past 6 months prior to admission? : No Specify Current Suicidal Plan:  (sts plan was to OD on Opioids) Access to Means: Yes Specify Access to Suicidal Means:  (Heroin use daily) What has been your use of drugs/alcohol within the last 12 months?:  (daily use) Previous Attempts/Gestures: Yes How many times?:  (1 in 2015) Other Self Harm Risks:  (none reported) Triggers for Past Attempts: Unpredictable Intentional Self Injurious Behavior: None Family Suicide History: No Recent stressful life event(s): Job Loss (sts told employer yesterday he needed help w drugs & quit) Persecutory voices/beliefs?: No (none reported) Depression: Yes Depression Symptoms: Tearfulness, Isolating, Fatigue, Guilt, Loss of interest in usual pleasures, Feeling worthless/self pity, Feeling angry/irritable Substance abuse history and/or treatment for  substance abuse?: Yes Suicide prevention information given to non-admitted patients: Not applicable  Risk to Others within the past 6 months Homicidal Ideation: No (denies) Does patient have any lifetime risk of violence toward others beyond the six months prior to admission? : No (denies) Thoughts of Harm to Others: No (denies) Current Homicidal Intent: No Current Homicidal Plan: No Access to Homicidal Means: No Identified Victim:  (none reported) History of harm to others?: No (denies) Assessment of Violence: None Noted Violent Behavior Description:  (na) Does patient have access to weapons?: No (denies access to guns) Criminal Charges Pending?: No (denies any legal hx for violent crime; arrests-B&E, Larceny) Does patient have a court date: No Is patient on probation?: No  Psychosis Hallucinations: None noted (denies) Delusions: None noted  Mental Status Report Appearance/Hygiene: Unremarkable, In scrubs Eye Contact: Fair Motor Activity: Freedom of movement Speech: Logical/coherent Level of Consciousness: Quiet/awake Mood: Depressed, Anxious Affect: Anxious, Blunted, Depressed Anxiety Level: Minimal Thought Processes: Coherent, Relevant Judgement: Partial Orientation: Person, Place, Time, Situation Obsessive Compulsive Thoughts/Behaviors: None (no sx reported)  Cognitive Functioning Concentration: Normal Memory: Recent Intact, Remote Intact IQ: Average Insight: Fair Impulse Control: Poor Appetite: Good Weight Loss:  (0) Weight Gain:  (10+ in 2 months) Sleep: Decreased Total Hours of Sleep:  (5-6 hours) Vegetative Symptoms: None  ADLScreening [facility] Assessment Services) Patient's cognitive ability adequate to safely complete daily activities?: Yes Patient able to express need for assistance with ADLs?: Yes Independently performs ADLs?: Yes (appropriate for developmental age) (no barriers presented)  Prior Inpatient Therapy Prior Inpatient Therapy: Yes Prior  Therapy Dates:  (2006, 2014, 2015) Prior Therapy Facilty/Provider(s):  [facility]) Reason for Treatment:  (Depression, Anxiety, PTSD from brother's death/OD pt saw)  Prior Outpatient Therapy Prior Outpatient Therapy: No Does patient have an ACCT team?: No Does patient have Intensive In-House Services?  : No Does patient have [facility] services? : No Does patient have P4CC services?: No  ADL Screening (condition at time of admission) Patient's cognitive ability adequate to safely complete daily activities?: Yes Patient able to express need for assistance with ADLs?: Yes Independently performs ADLs?: Yes (appropriate for developmental age) (no barriers presented)       Abuse/Neglect Assessment (Assessment to be complete while patient is alone) Physical Abuse: Denies Verbal Abuse: Denies Sexual Abuse: Denies Exploitation of patient/patient's resources: Denies Self-Neglect: Denies     Merchant navy officer (For Healthcare) Does Patient Have a Medical Advance Directive?: No Would patient like information on creating a medical advance directive?: No - Patient declined    Additional Information 1:1 In Past 12 Months?: No CIRT Risk: No Elopement Risk: No Does patient have medical clearance?: Yes     Disposition:  Disposition Initial Assessment  Completed for this Encounter: Yes Disposition of Patient: Other dispositions Other disposition(s): Other (Comment) ([facility])  Per Donell SievertSpencer Simon, PA: Pt meets IP criteria. Recommend IP tx  Per Clint Bolderori Beck, AC: [facility]. Pt can come at 1 AM 07/08/16.  Spoke with Dr. Juleen ChinaKohut, EDP, at [facility]: Advised of recommendation. Spoke with RN Juliette AlcideMelinda: Advised of plan and timing for admission.   Beryle FlockMary Burleigh Brockmann, MS, CRC, [facility] Triage Specialist [facility] Leia Coletti T 07/07/2016 10:20 PM

## 2016-07-07 NOTE — ED Provider Notes (Signed)
[facility]Y DEPT Provider Note   CSN: [id number] Arrival date & time: 07/07/16  1213     History   Chief Complaint Chief Complaint  Patient presents with  . Suicidal  . Detox    HPI Ryan Kelley is a 30 y.o. male.  HPI  Pt was seen at 1400. Per pt, c/o gradual onset and worsening of persistent depression for the past several weeks. Endorses SI for the past 2 days. Plan to OD on opioids. Endorses daily opioid use, including heroin. Pt states he took approximately 15 tabs of 50mg  tramadol this morning at 0700 "to get high." Denies OD was SA. States he has not been taking his psych meds for an unknown period of time. Denies HI, no hallucinations. Denies CP/SOB, no abd pain, no N/V/D.    Past Medical History:  Diagnosis Date  . Anxiety   . Attempted suicide   . Chronic back pain   . Chronic knee pain   . Depression   . Insomnia   . PTSD (post-traumatic stress disorder)   . Substance abuse     Patient Active Problem List   Diagnosis Date Noted  . Polysubstance (including opioids) dependence with physiological dependence (HCC) 12/06/2013  . Substance induced mood disorder (HCC) 12/05/2013  . Opioid dependence (HCC) 10/18/2012  . Opioid use with withdrawal (HCC) 10/18/2012  . PTSD (post-traumatic stress disorder) 10/18/2012    History reviewed. No pertinent surgical history.     Home Medications    Prior to Admission medications   Medication Sig Start Date End Date Taking? Authorizing Provider  naproxen (NAPROSYN) 500 MG tablet Take 1 tablet (500 mg total) by mouth 2 (two) times daily with a meal. 05/26/16   Everlene Farrier, PA-C  predniSONE (DELTASONE) 10 MG tablet Take 2 tablets (20 mg total) by mouth 2 (two) times daily with a meal. 02/23/16   Hope Orlene Och, NP    Family History Family History  Problem Relation Age of Onset  . Diabetes Mother   . Cancer Mother   . Hypertension Mother   . Hypertension Father     Social History Social History  Substance  Use Topics  . Smoking status: Current Every Day Smoker    Packs/day: 1.00    Years: 7.00    Types: Cigarettes  . Smokeless tobacco: Never Used  . Alcohol use 0.0 oz/week     Comment: occasionally      Allergies   Poison ivy extract [poison ivy extract]   Review of Systems Review of Systems ROS: Statement: All systems negative except as marked or noted in the HPI; Constitutional: Negative for fever and chills. ; ; Eyes: Negative for eye pain, redness and discharge. ; ; ENMT: Negative for ear pain, hoarseness, nasal congestion, sinus pressure and sore throat. ; ; Cardiovascular: Negative for chest pain, palpitations, diaphoresis, dyspnea and peripheral edema. ; ; Respiratory: Negative for cough, wheezing and stridor. ; ; Gastrointestinal: Negative for nausea, vomiting, diarrhea, abdominal pain, blood in stool, hematemesis, jaundice and rectal bleeding. . ; ; Genitourinary: Negative for dysuria, flank pain and hematuria. ; ; Musculoskeletal: Negative for back pain and neck pain. Negative for swelling and trauma.; ; Skin: Negative for pruritus, rash, abrasions, blisters, bruising and skin lesion.; ; Neuro: Negative for headache, lightheadedness and neck stiffness. Negative for weakness, altered level of consciousness, altered mental status, extremity weakness, paresthesias, involuntary movement, seizure and syncope.; Psych:  +depression, +SI. No SA, no HI, no hallucinations.      Physical Exam  Updated Vital Signs BP 119/70   Pulse 71   Resp 20   Ht 5\' 5"  (1.651 m)   Wt 210 lb (95.3 kg)   SpO2 96%   BMI 34.95 kg/m   Physical Exam 1405: Physical examination:  Nursing notes reviewed; Vital signs and O2 SAT reviewed;  Constitutional: Well developed, Well nourished, Well hydrated, In no acute distress; Head:  Normocephalic, atraumatic; Eyes: EOMI, PERRL, No scleral icterus; ENMT: Mouth and pharynx normal, Mucous membranes moist; Neck: Supple, Full range of motion, No lymphadenopathy;  Cardiovascular: Regular rate and rhythm, No gallop; Respiratory: Breath sounds clear & equal bilaterally, No wheezes.  Speaking full sentences with ease, Normal respiratory effort/excursion; Chest: Nontender, Movement normal; Abdomen: Soft, Nontender, Nondistended, Normal bowel sounds; Genitourinary: No CVA tenderness; Extremities: Pulses normal, No tenderness, No edema, No calf edema or asymmetry.; Neuro: AA&Ox3, Major CN grossly intact.  Speech clear. No gross focal motor or sensory deficits in extremities.; Skin: Color normal, Warm, Dry.; Psych:  Endorses SI.     ED Treatments / Results  Labs (all labs ordered are listed, but only abnormal results are displayed)   EKG  EKG Interpretation  Date/Time:  Wednesday July 07 2016 14:10:40 EST Ventricular Rate:  71 PR Interval:    QRS Duration: 110 QT Interval:  411 QTC Calculation: 447 R Axis:   81 Text Interpretation:  Sinus rhythm Probable inferior infarct, old Anterolateral infarct, age indeterminate No old tracing to compare Confirmed by [facility]  MD, Symphanie Cederberg [phone]([id number]) on 07/07/2016 3:06:10 PM       EKG Interpretation  Date/Time:  Wednesday July 07 2016 17:07:01 EST Ventricular Rate:  63 PR Interval:    QRS Duration: 114 QT Interval:  429 QTC Calculation: 440 R Axis:   79 Text Interpretation:  Sinus rhythm Probable anterolateral infarct, old Baseline wander Since last tracing of earlier today No significant change was found Confirmed by [facility]  MD, Nicholos JohnsKATHLEEN [phone]([id number]) on 07/07/2016 5:25:33 PM        EKG Interpretation  Date/Time:  Wednesday July 07 2016 19:23:14 EST Ventricular Rate:  69 PR Interval:  148 QRS Duration: 106 QT Interval:  432 QTC Calculation: 462 R Axis:   68 Text Interpretation:  Normal sinus rhythm Normal ECG Since last tracing of earlier today No significant change was found Confirmed by [facility]  MD, Nicholos JohnsKATHLEEN [phone]([id number]) on 07/07/2016 7:29:56 PM          Radiology   Procedures Procedures  (including critical care time)  Medications Ordered in ED Medications - No data to display   Initial Impression / Assessment and Plan / ED Course  I have reviewed the triage vital signs and the nursing notes.  Pertinent labs & imaging results that were available during my care of the patient were reviewed by me and considered in my medical decision making (see chart for details).  MDM Reviewed: previous chart, nursing note and vitals Reviewed previous: labs Interpretation: labs   Results for orders placed or performed during the hospital encounter of 07/07/16  Urine rapid drug screen (hosp performed)  Result Value Ref Range   Opiates NONE DETECTED NONE DETECTED   Cocaine POSITIVE (A) NONE DETECTED   Benzodiazepines POSITIVE (A) NONE DETECTED   Amphetamines NONE DETECTED NONE DETECTED   Tetrahydrocannabinol POSITIVE (A) NONE DETECTED   Barbiturates NONE DETECTED NONE DETECTED  Comprehensive metabolic panel  Result Value Ref Range   Sodium 136 135 - 145 mmol/L   Potassium 3.4 (L) 3.5 - 5.1 mmol/L   Chloride 103  101 - 111 mmol/L   CO2 23 22 - 32 mmol/L   Glucose, Bld 88 65 - 99 mg/dL   BUN 18 6 - 20 mg/dL   Creatinine, Ser 1.470.73 0.61 - 1.24 mg/dL   Calcium 8.9 8.9 - 82.910.3 mg/dL   Total Protein 7.0 6.5 - 8.1 g/dL   Albumin 4.3 3.5 - 5.0 g/dL   AST 28 15 - 41 U/L   ALT 25 17 - 63 U/L   Alkaline Phosphatase 67 38 - 126 U/L   Total Bilirubin 0.8 0.3 - 1.2 mg/dL   GFR calc non Af Amer >60 >60 mL/min   GFR calc Af Amer >60 >60 mL/min   Anion gap 10 5 - 15  Ethanol  Result Value Ref Range   Alcohol, Ethyl (B) <5 <5 mg/dL  CBC with Differential  Result Value Ref Range   WBC 13.1 (H) 4.0 - 10.5 K/uL   RBC 5.04 4.22 - 5.81 MIL/uL   Hemoglobin 16.1 13.0 - 17.0 g/dL   HCT 56.245.1 13.039.0 - 86.552.0 %   MCV 89.5 78.0 - 100.0 fL   MCH 31.9 26.0 - 34.0 pg   MCHC 35.7 30.0 - 36.0 g/dL   RDW 78.412.8 69.611.5 - 29.515.5 %   Platelets 272 150 - 400 K/uL   Neutrophils Relative % 63 %   Neutro Abs 8.3  (H) 1.7 - 7.7 K/uL   Lymphocytes Relative 27 %   Lymphs Abs 3.5 0.7 - 4.0 K/uL   Monocytes Relative 4 %   Monocytes Absolute 0.6 0.1 - 1.0 K/uL   Eosinophils Relative 5 %   Eosinophils Absolute 0.7 0.0 - 0.7 K/uL   Basophils Relative 1 %   Basophils Absolute 0.1 0.0 - 0.1 K/uL   WBC Morphology ATYPICAL LYMPHOCYTES   Salicylate level  Result Value Ref Range   Salicylate Lvl <7.0 2.8 - 30.0 mg/dL  Acetaminophen level  Result Value Ref Range   Acetaminophen (Tylenol), Serum <10 (L) 10 - 30 ug/mL  Acetaminophen level  Result Value Ref Range   Acetaminophen (Tylenol), Serum <10 (L) 10 - 30 ug/mL  Salicylate level  Result Value Ref Range   Salicylate Lvl <7.0 2.8 - 30.0 mg/dL  Magnesium  Result Value Ref Range   Magnesium 2.0 1.7 - 2.4 mg/dL    [id number]:  Pt told me he took tramadol this morning at 0700; told ED RN he took med at 1000. States to both of us it was in attempt to "get high" and not SA. Poison control recommended repeat EKG in 2 hours; this repeat EKG continued to have QRS >100, so Poison Control recommended another EKG in 2 hours.   1900:  Repeat ASA, APAP and EKG obtained. Poison Control called: medically cleared. TTS to eval.  2100:  Holding orders written.    Final Clinical Impressions(s) / ED Diagnoses   Final diagnoses:  None    New Prescriptions New Prescriptions   No medications on file     Samuel JesterKathleen Jorge Retz, DO 07/07/16 2104

## 2016-07-07 NOTE — ED Notes (Signed)
Poison control called, monitor for sedation, N/V, pinpoint pupils, decrease respiration, hypotension, and bradycardia. If asymptomatic 4-6 hours from time to presentation pt will be cleared. QRSD is widened per PC, would like to be under 100. Repeat EKG in 2-3 hours, if widened or >120 do bicard IVP 1-222mEq/kg and repeat EKG after bolus. MD Clarene DukeMcManus notified of PC suggestions. Labs have been drawn.

## 2016-07-07 NOTE — ED Notes (Signed)
TTS in progress 

## 2016-07-07 NOTE — ED Notes (Signed)
[facility]

## 2016-07-07 NOTE — ED Notes (Signed)
Received report on pt, pt resting, no distress noted, sitter remains at bedside,  

## 2016-07-07 NOTE — ED Notes (Signed)
Pt resting with eyes closed, appears to be in no distress. Respirations are even and unlabored. Sitter at bedside.  

## 2016-07-07 NOTE — ED Notes (Signed)
Contact numbers [place], mother  [phone] Toni AmendCourtney, girlfriend  [phone]

## 2016-07-07 NOTE — ED Notes (Signed)
Pt wanded by security. 

## 2016-07-07 NOTE — ED Notes (Signed)
Pt medically cleared by PC at this time. MD Clarene DukeMcManus notified.

## 2016-07-07 NOTE — ED Triage Notes (Signed)
Pt c/o having suicidal thoughts x 2 days. Pt reports his plan is to overdose on opiods but hasn't acted upon it. Pt reports he wants detox from opiods and heroin, which he uses daily. Opiods and herion last used today. Pt denies visual and auditory hallucinations. Pt has been treated for suicidal thoughts and detox in the past. Denies nausea, vomiting, tremors, headache. Reports fullness feeling in his head.

## 2016-07-07 NOTE — ED Notes (Addendum)
Pt states he began having SI yesterday with thoughts of "taking too many pills". Wants detox from "opiods and heroin", pt took "10-15 50mg  Tramadol tab and used heroin" around 1000.

## 2016-07-07 NOTE — ED Notes (Signed)
Pt wakens when opening door and is alert and oriented X4.

## 2016-07-07 NOTE — ED Notes (Signed)
MD Clarene DukeMcManus notified of pt ingestion.

## 2016-07-08 ENCOUNTER — Inpatient Hospital Stay (HOSPITAL_COMMUNITY)
Admission: AD | Admit: 2016-07-08 | Discharge: 2016-07-12 | DRG: 897 | Disposition: A | Discharge status: Home / Self Care | Payer: Federal, State, Local not specified - Other | Source: Intra-hospital | Attending: Psychiatry | Admitting: Psychiatry

## 2016-07-08 ENCOUNTER — Encounter (HOSPITAL_COMMUNITY): Payer: Self-pay | Admitting: *Deleted

## 2016-07-08 DIAGNOSIS — M549 Dorsalgia, unspecified: Secondary | ICD-10-CM | POA: Diagnosis present

## 2016-07-08 DIAGNOSIS — F329 Major depressive disorder, single episode, unspecified: Secondary | ICD-10-CM | POA: Diagnosis present

## 2016-07-08 DIAGNOSIS — Z8249 Family history of ischemic heart disease and other diseases of the circulatory system: Secondary | ICD-10-CM | POA: Diagnosis not present

## 2016-07-08 DIAGNOSIS — Z9109 Other allergy status, other than to drugs and biological substances: Secondary | ICD-10-CM

## 2016-07-08 DIAGNOSIS — F17210 Nicotine dependence, cigarettes, uncomplicated: Secondary | ICD-10-CM | POA: Diagnosis present

## 2016-07-08 DIAGNOSIS — Z833 Family history of diabetes mellitus: Secondary | ICD-10-CM

## 2016-07-08 DIAGNOSIS — F1123 Opioid dependence with withdrawal: Secondary | ICD-10-CM | POA: Diagnosis present

## 2016-07-08 DIAGNOSIS — F1193 Opioid use, unspecified with withdrawal: Secondary | ICD-10-CM

## 2016-07-08 DIAGNOSIS — F411 Generalized anxiety disorder: Secondary | ICD-10-CM | POA: Diagnosis present

## 2016-07-08 DIAGNOSIS — G8929 Other chronic pain: Secondary | ICD-10-CM | POA: Diagnosis present

## 2016-07-08 DIAGNOSIS — R45851 Suicidal ideations: Secondary | ICD-10-CM | POA: Diagnosis present

## 2016-07-08 DIAGNOSIS — Z808 Family history of malignant neoplasm of other organs or systems: Secondary | ICD-10-CM

## 2016-07-08 DIAGNOSIS — F4310 Post-traumatic stress disorder, unspecified: Secondary | ICD-10-CM | POA: Diagnosis present

## 2016-07-08 DIAGNOSIS — F1120 Opioid dependence, uncomplicated: Secondary | ICD-10-CM | POA: Diagnosis not present

## 2016-07-08 DIAGNOSIS — Z79899 Other long term (current) drug therapy: Secondary | ICD-10-CM

## 2016-07-08 DIAGNOSIS — F0630 Mood disorder due to known physiological condition, unspecified: Secondary | ICD-10-CM | POA: Diagnosis not present

## 2016-07-08 DIAGNOSIS — F1010 Alcohol abuse, uncomplicated: Secondary | ICD-10-CM | POA: Diagnosis present

## 2016-07-08 MED ORDER — ONDANSETRON 4 MG PO TBDP: 4.0000 mg | ORAL_TABLET | Freq: Four times a day (QID) | ORAL | Status: DC | PRN

## 2016-07-08 MED ORDER — DICYCLOMINE HCL 20 MG PO TABS: 20.0000 mg | ORAL_TABLET | Freq: Four times a day (QID) | ORAL | Status: DC | PRN

## 2016-07-08 MED ORDER — CLONIDINE HCL 0.1 MG PO TABS
0.1000 mg | ORAL_TABLET | Freq: Every day | ORAL | Status: DC
  Administered 2016-07-12: 0.1 mg via ORAL
  Filled 2016-07-08 (×2): qty 1

## 2016-07-08 MED ORDER — ACETAMINOPHEN 325 MG PO TABS
650.0000 mg | ORAL_TABLET | Freq: Four times a day (QID) | ORAL | Status: DC | PRN
  Administered 2016-07-10: 650 mg via ORAL
  Filled 2016-07-08: qty 2

## 2016-07-08 MED ORDER — METHOCARBAMOL 500 MG PO TABS
500.0000 mg | ORAL_TABLET | Freq: Three times a day (TID) | ORAL | Status: DC | PRN
  Administered 2016-07-10 – 2016-07-11 (×2): 500 mg via ORAL
  Filled 2016-07-08 (×2): qty 1

## 2016-07-08 MED ORDER — LOPERAMIDE HCL 2 MG PO CAPS: 2.0000 mg | ORAL_CAPSULE | ORAL | Status: DC | PRN

## 2016-07-08 MED ORDER — TRAZODONE HCL 50 MG PO TABS
50.0000 mg | ORAL_TABLET | Freq: Every evening | ORAL | Status: DC | PRN
  Administered 2016-07-08 – 2016-07-11 (×5): 50 mg via ORAL
  Filled 2016-07-08 (×10): qty 1

## 2016-07-08 MED ORDER — MAGNESIUM HYDROXIDE 400 MG/5ML PO SUSP: 30.0000 mL | Freq: Every day | ORAL | Status: DC | PRN

## 2016-07-08 MED ORDER — GABAPENTIN 300 MG PO CAPS
300.0000 mg | ORAL_CAPSULE | Freq: Three times a day (TID) | ORAL | Status: DC
  Administered 2016-07-08 – 2016-07-12 (×10): 300 mg via ORAL
  Filled 2016-07-08 (×17): qty 1

## 2016-07-08 MED ORDER — HYDROXYZINE HCL 25 MG PO TABS
25.0000 mg | ORAL_TABLET | Freq: Four times a day (QID) | ORAL | Status: DC | PRN
  Administered 2016-07-11 (×2): 25 mg via ORAL
  Filled 2016-07-08: qty 1
  Filled 2016-07-08: qty 10
  Filled 2016-07-08: qty 1

## 2016-07-08 MED ORDER — NAPROXEN 500 MG PO TABS
500.0000 mg | ORAL_TABLET | Freq: Two times a day (BID) | ORAL | Status: DC | PRN
  Administered 2016-07-08 – 2016-07-11 (×2): 500 mg via ORAL
  Filled 2016-07-08 (×2): qty 1

## 2016-07-08 MED ORDER — CLONIDINE HCL 0.1 MG PO TABS
0.1000 mg | ORAL_TABLET | ORAL | Status: AC
  Administered 2016-07-10 – 2016-07-11 (×4): 0.1 mg via ORAL
  Filled 2016-07-08 (×4): qty 1

## 2016-07-08 MED ORDER — CLONIDINE HCL 0.1 MG PO TABS
0.1000 mg | ORAL_TABLET | Freq: Four times a day (QID) | ORAL | Status: AC
  Administered 2016-07-08 – 2016-07-09 (×5): 0.1 mg via ORAL
  Filled 2016-07-08 (×9): qty 1

## 2016-07-08 MED ORDER — ALUM & MAG HYDROXIDE-SIMETH 200-200-20 MG/5ML PO SUSP: 30.0000 mL | ORAL | Status: DC | PRN

## 2016-07-08 MED ORDER — DULOXETINE HCL 20 MG PO CPEP
20.0000 mg | ORAL_CAPSULE | Freq: Two times a day (BID) | ORAL | Status: DC
  Administered 2016-07-08: 20 mg via ORAL
  Filled 2016-07-08 (×4): qty 1

## 2016-07-08 NOTE — [facility] Group Notes (Signed)
[facility] LCSW Group Therapy 07/08/2016  1:15 PM   Type of Therapy: Group Therapy  Participation Level: Did Not Attend. Patient invited to participate but declined.   Smt Lokey, MSW, LCSW Clinical Social Worker [facility] [phone]   

## 2016-07-08 NOTE — Progress Notes (Signed)
30 year old male pt admitted on voluntary basis. Ryan Kelley, on admission, reports that he was feeling depressed and suicidal and had plan to overdose. He reports that he is here today because he needs help with his drug problem. He reports he's been using heroin and opiates and reports using as much as he can get. Ryan Kelley denies SI on admission and is able to contract for safety on the unit. He reports that he was feeling suicidal yesterday which is why he came to the hospital. Ryan Kelley was cooperative during admission process and appeared in no acute distress during this time. Ryan Kelley was oriented to the unit and safety maintained.

## 2016-07-08 NOTE — Tx Team (Signed)
Interdisciplinary Treatment and Diagnostic Plan Update  07/08/2016 Time of Session: 9:30am Ryan Kelley MRN: [id number]  Principal Diagnosis: Opioid dependence (HCC)   Current Medications:  Current Facility-Administered Medications  Medication Dose Route Frequency Provider Last Rate Last Dose  . acetaminophen (TYLENOL) tablet 650 mg  650 mg Oral Q6H PRN Kerry HoughSpencer E Simon, PA-C      . alum & mag hydroxide-simeth (MAALOX/MYLANTA) 200-200-20 MG/5ML suspension 30 mL  30 mL Oral Q4H PRN Kerry HoughSpencer E Simon, PA-C      . cloNIDine (CATAPRES) tablet 0.1 mg  0.1 mg Oral QID Kerry HoughSpencer E Simon, PA-C   0.1 mg at 07/08/16 [id number]   Followed by  . [START ON 07/10/2016] cloNIDine (CATAPRES) tablet 0.1 mg  0.1 mg Oral BH-qamhs Spencer E Simon, PA-C       Followed by  . [START ON 07/12/2016] cloNIDine (CATAPRES) tablet 0.1 mg  0.1 mg Oral QAC breakfast Kerry HoughSpencer E Simon, PA-C      . dicyclomine (BENTYL) tablet 20 mg  20 mg Oral Q6H PRN Kerry HoughSpencer E Simon, PA-C      . DULoxetine (CYMBALTA) DR capsule 20 mg  20 mg Oral BID Kerry HoughSpencer E Simon, PA-C   20 mg at 07/08/16 [id number]  . gabapentin (NEURONTIN) capsule 300 mg  300 mg Oral TID Kerry HoughSpencer E Simon, PA-C   300 mg at 07/08/16 [id number]  . hydrOXYzine (ATARAX/VISTARIL) tablet 25 mg  25 mg Oral Q6H PRN Kerry HoughSpencer E Simon, PA-C      . loperamide (IMODIUM) capsule 2-4 mg  2-4 mg Oral PRN Kerry HoughSpencer E Simon, PA-C      . magnesium hydroxide (MILK OF MAGNESIA) suspension 30 mL  30 mL Oral Daily PRN Kerry HoughSpencer E Simon, PA-C      . methocarbamol (ROBAXIN) tablet 500 mg  500 mg Oral Q8H PRN Kerry HoughSpencer E Simon, PA-C      . naproxen (NAPROSYN) tablet 500 mg  500 mg Oral BID PRN Kerry HoughSpencer E Simon, PA-C      . ondansetron (ZOFRAN-ODT) disintegrating tablet 4 mg  4 mg Oral Q6H PRN Kerry HoughSpencer E Simon, PA-C      . traZODone (DESYREL) tablet 50 mg  50 mg Oral QHS,MR X 1 Spencer E Simon, PA-C       PTA Medications: Prescriptions Prior to Admission  Medication Sig Dispense Refill Last Dose  . clonazePAM (KLONOPIN) 0.5  MG tablet Take 0.5 mg by mouth 4 (four) times daily.   07/06/2016 at Unknown time  . escitalopram (LEXAPRO) 10 MG tablet Take 10 mg by mouth daily.   07/06/2016 at Unknown time  . gabapentin (NEURONTIN) 800 MG tablet Take 800 mg by mouth 3 (three) times daily.   07/06/2016 at Unknown time  . ibuprofen (ADVIL,MOTRIN) 200 MG tablet Take 200 mg by mouth every 6 (six) hours as needed for moderate pain.   unknown  . traZODone (DESYREL) 100 MG tablet Take 100 mg by mouth at bedtime.   07/06/2016 at Unknown time    Patient Stressors: Financial difficulties Occupational concerns Substance abuse  Patient Strengths: Ability for insight Average or above average intelligence Capable of independent living [employer] of knowledge Motivation for treatment/growth  Treatment Modalities: Medication Management, Group therapy, Case management,  1 to 1 session with clinician, Psychoeducation, Recreational therapy.   Physician Treatment Plan for Primary Diagnosis: Opioid dependence (HCC) Long Term Goal(s):     Short Term Goals:    Medication Management: Evaluate patient's response, side effects, and tolerance of medication regimen.  Therapeutic Interventions: 1  to 1 sessions, Unit Group sessions and Medication administration.  Evaluation of Outcomes: Progressing   RN Treatment Plan for Primary Diagnosis: Opioid dependence (HCC) Long Term Goal(s): Knowledge of disease and therapeutic regimen to maintain health will improve  Short Term Goals: Ability to remain free from injury will improve, Ability to disclose and discuss suicidal ideas, Ability to identify and develop effective coping behaviors will improve and Compliance with prescribed medications will improve  Medication Management: RN will administer medications as ordered by provider, will assess and evaluate patient's response and provide education to patient for prescribed medication. RN will report any adverse and/or side effects to  prescribing provider.  Therapeutic Interventions: 1 on 1 counseling sessions, Psychoeducation, Medication administration, Evaluate responses to treatment, Monitor vital signs and CBGs as ordered, Perform/monitor CIWA, COWS, AIMS and Fall Risk screenings as ordered, Perform wound care treatments as ordered.  Evaluation of Outcomes: Progressing   LCSW Treatment Plan for Primary Diagnosis: Opioid dependence (HCC) Long Term Goal(s): Safe transition to appropriate next level of care at discharge, Engage patient in therapeutic group addressing interpersonal concerns.  Short Term Goals: Engage patient in aftercare planning with referrals and resources, Increase social support, Increase emotional regulation, Identify triggers associated with mental health/substance abuse issues and Increase skills for wellness and recovery  Therapeutic Interventions: Assess for all discharge needs, 1 to 1 time with Social worker, Explore available resources and support systems, Assess for adequacy in community support network, Educate family and significant other(s) on suicide prevention, Complete Psychosocial Assessment, Interpersonal group therapy.  Evaluation of Outcomes: Progressing   Progress in Treatment :  Attending groups: Continuing to assess  Participating in groups: Continuing to assess  Taking medication as prescribed: Yes, MD continuing to assess for appropriate medication regimen  Toleration medication: Yes  Family/Significant other contact made: Treatment team assessing for appropriate contacts  Patient understands diagnosis: Yes  Discussing patient identified problems/goals with staff: Yes  Medical problems stabilized or resolved: Yes  Denies suicidal/homicidal ideation: Treatment team continuing to asses  Issues/concerns per patient self-inventory: None reported  Other: N/A  New problem(s) identified: None reported at this time    New Short Term/Long Term Goal(s): None at this  time    Discharge Plan or Barriers: Treatment team continuing to assess.    Reason for Continuation of Hospitalization: Anxiety Depression Medication stabilization Suicidal Ideations Withdrawal symptoms  Estimated Length of Stay: 3-5 days    Attendees:  Patient:  Physician: Dr. Mckinley Jewelates, MD 11/30/20179:30am  Nursing: Marzetta Boardhrista Dopson, Waynetta SandyJan Wright, Perrin MaltesePenny Carter, RN 11/30/20179:30am  RN Care Manager:   Social Workers: Chad CordialLauren Carter, LCSW, Belenda CruiseKristin Castella Lerner, LCSW, Trula SladeHeather Smart, LCSW 11/30/20179:30am  Nurse Pratictioners: Gray BernhardtMay Augustin, NP, Armandina StammerAgnes Nwoko, NP 11/30/20179:30am  Other:11/30/20179:30am    Scribe for Treatment Team: Samuella BruinKristin Zymier Rodgers, LCSW Clinical Social Worker [facility] [phone]

## 2016-07-08 NOTE — Progress Notes (Signed)
D: Pt. is seen in his room, resting in bed. Denies having any SI/HI/AVH/Pain at this time. Pt. presents with a depressed & sad affect and mood. Pt. remains isolative to his room but makes an effort to go out into the dayroom with encouragement from Clinical research associatewriter. Pt. states "I'm just trying to get off these drugs". Last COWS was a 4. Pt. remains cooperative with POC.    A: Encouragement and support given. Meds. ordered and given.   R: 1:1 interaction in private. Safety maintained with Q 15 checks. No other questions/concerns at this time.

## 2016-07-08 NOTE — Progress Notes (Signed)
D:Pt has been in the bed most of the day. Pt ate a small amount of breakfast and took his morning medications. He reported stomach cramps and tiredness. Pt did not eat lunch and refused noon medications stating that he was tired and wanting to rest. A:Offered support, encouragement and 15 minute checks.  R:Pt denies si and hi. Safety maintained on the unit.

## 2016-07-08 NOTE — Tx Team (Signed)
Initial Treatment Plan 07/08/2016 3:52 AM Ryan Kelley Brandi [id number]    PATIENT STRESSORS: Financial difficulties Occupational concerns Substance abuse   PATIENT STRENGTHS: Ability for insight Average or above average intelligence Capable of independent living General fund of knowledge Motivation for treatment/growth   PATIENT IDENTIFIED PROBLEMS: Depression Suicidal thoughts "Been using heroin and opiates"                     DISCHARGE CRITERIA:  Ability to meet basic life and health needs Improved stabilization in mood, thinking, and/or behavior Verbal commitment to aftercare and medication compliance Withdrawal symptoms are absent or subacute and managed without 24-hour nursing intervention  PRELIMINARY DISCHARGE PLAN: Attend aftercare/continuing care group Return to previous living arrangement  PATIENT/FAMILY INVOLVEMENT: This treatment plan has been presented to and reviewed with the patient, Ryan Kelley Stefanelli, and/or family member, .  The patient and family have been given the opportunity to ask questions and make suggestions.  Karol Liendo, [place], [place] 07/08/2016, 3:52 AM

## 2016-07-08 NOTE — Plan of Care (Signed)
Problem: Safety: Goal: Periods of time without injury will increase Outcome: Progressing Pt. remains a low fall risk, denies SI/HI/AVH at this time, Q 15 checks in effect.    

## 2016-07-08 NOTE — [facility] Group Notes (Signed)
[facility] Group Notes:  (Nursing/MHT/Case Management/Adjunct)  Date:  07/08/2016  Time:0900  Type of Therapy:  Nurse Education  Participation Level:  Did Not Attend  Participation Quality:  Affect:    Cognitive:    Insight:    Engagement in Group: Modes of Intervention:   Summary of Progress/Problems:  Beatrix ShipperWright, Shaune Malacara Martin 07/08/2016, 5:25 PM

## 2016-07-08 NOTE — [facility] Suicide Risk Assessment (Signed)
[facility]   Nursing information obtained from:    Demographic factors:    Current Mental Status:    Loss Factors:    Historical Factors:    Risk Reduction Factors:     Total Time spent with patient: 20 minutes Principal Problem: Opioid dependence (HCC) Diagnosis:   Patient Active Problem List   Diagnosis Date Noted  . Opioid withdrawal (HCC) [F11.23] 07/08/2016  . Opioid dependence (HCC) [F11.20] 10/18/2012  . Opioid use with withdrawal [facility](HCC) [F11.93] 10/18/2012   Patient denies any current suicidal or homicidal ideation, plan or intent.  Continued Clinical Symptoms:  Alcohol Use Disorder Identification Test Final Score (AUDIT): 2 The "Alcohol Use Disorders Identification Test", Guidelines for Use in Primary Care, Second Edition.  World Science writerHealth Organization [facility](WHO). Score between 0-7:  no or low risk or alcohol related problems. Score between 8-15:  moderate risk of alcohol related problems. Score between 16-19:  high risk of alcohol related problems. Score 20 or above:  warrants further diagnostic evaluation for alcohol dependence and treatment.   CLINICAL FACTORS:   Alcohol/Substance Abuse/Dependencies   Musculoskeletal: Strength & Muscle Tone: within normal limits Gait & Station: in bed Patient leans: N/A  Psychiatric Specialty Exam: Physical Exam  ROS  Blood pressure 117/72, pulse (!) 51, temperature 97.7 F (36.5 C), temperature source Oral, resp. rate 18, height 5\' 5"  (1.651 m), weight 91.2 kg (201 lb), SpO2 96 %.Body mass index is 33.45 kg/m.   General Appearance: Casual  Eye Contact:  Fair  Speech:  Clear and Coherent  Volume:  Normal  Mood:  Dysphoric  Affect:  Appropriate  Thought Process:  Coherent  Orientation:  Negative  Thought Content:  Negative  Suicidal Thoughts:  No  Homicidal Thoughts:  No  Memory:  Negative  Judgement:  Fair  Insight:  Present  Psychomotor Activity:  Normal  Concentration:  Attention Span: Fair   Recall:  Fair  Fund of Knowledge:  Good  Language:  Good  Akathisia:  No  Handed:  Right  AIMS (if indicated):     Assets:  Resilience  ADL's:  Intact  Cognition:  WNL  Sleep:  Number of Hours: 2.25    COGNITIVE FEATURES THAT CONTRIBUTE TO RISK:  None    SUICIDE RISK:   Mild:  Suicidal ideation of limited frequency, intensity, duration, and specificity.  There are no identifiable plans, no associated intent, mild dysphoria and related symptoms, good self-control (both objective and subjective assessment), few other risk factors, and identifiable protective factors, including available and accessible social support.   PLAN OF CARE: se PAA  I certify that inpatient services furnished can reasonably be expected to improve the patient's condition.  Acquanetta SitElizabeth Woods Kjerstin Abrigo, MD 07/08/2016, 11:22 AM

## 2016-07-08 NOTE — ED Notes (Signed)
Pt left with two belonging bags and [facility],

## 2016-07-08 NOTE — ED Notes (Signed)
[facility] here to transport pt,  

## 2016-07-08 NOTE — H&P (Signed)
Psychiatric Admission Assessment Adult  Patient Identification: Ryan Kelley MRN:  [id number] Date of Evaluation:  07/08/2016 Chief Complaint:  SUBSTANCE INDUCED MOOD DISORDER OPIOID USE DISORDER;SEVERE  CANNABIS USE DISORDER Principal Diagnosis: Opioid dependence ([place]) Diagnosis:   Patient Active Problem List   Diagnosis Date Noted  . Opioid withdrawal ([place]) [F11.23] 07/08/2016  . Opioid dependence ([place]) [F11.20] 10/18/2012  . Opioid use with withdrawal [facility]) [F11.93] 10/18/2012  Subjective Data: Patient was pleasant and appropriate and denied any current suicidal or homicidal ideation, plan or intent. He was however experiencing significant withdrawal symptoms and not able to completely participate in the fall examination at present.  Patient denies any significant mood concerns. He states that he was here at the hospital about a year ago for opiate use and withdrawal but states he has not really thought about going to a long-term program before. He mentions the [place] as a possible placement however. He states he has been able to stay off substances only for a month or 2 here or there. He does deny any current legal problems or any current probation.   Patient states that his primary drug is heroin which he uses IV and other opiates. He states that he recently however was using cocaine for the last couple of days. He does endorse tolerance, withdrawal, being unable to cut down, spending considerable time and effort in obtaining and used despite consequences and craving for opiates. He denies being hospitalized primarily for any psychiatric reasons and he states that he has followed up as an outpatient for depression and was  prescribed Lexapro but did not take it long. History of Present Illness:  Associated Signs/Symptoms: Depression Symptoms:  suicidal thoughts without plan, (Hypo) Manic Symptoms:  none Anxiety Symptoms:  Excessive Worry, Psychotic Symptoms:  none PTSD  Symptoms: Negative Total Time spent with patient: 20 minutes  Past Psychiatric History: see HPI  Is the patient at risk to self? No.  Has the patient been a risk to self in the past 6 months? Yes.    Has the patient been a risk to self within the distant past? No.  Is the patient a risk to others? No.  Has the patient been a risk to others in the past 6 months? No.  Has the patient been a risk to others within the distant past? No.   Prior Inpatient Therapy:  yes Prior Outpatient Therapy:  yes  Alcohol Screening: 1. How often do you have a drink containing alcohol?: 2 to 4 times a month 2. How many drinks containing alcohol do you have on a typical day when you are drinking?: 1 or 2 3. How often do you have six or more drinks on one occasion?: Never Preliminary Score: 0 9. Have you or someone else been injured as a result of your drinking?: No 10. Has a relative or friend or a doctor or another health worker been concerned about your drinking or suggested you cut down?: No Alcohol Use Disorder Identification Test Final Score (AUDIT): 2 Brief Intervention: AUDIT score less than 7 or less-screening does not suggest unhealthy drinking-brief intervention not indicated Substance Abuse History in the last 12 months:  Yes.   Consequences of Substance Abuse: Withdrawal Symptoms:   Diaphoresis Headaches Nausea Tremors Previous Psychotropic Medications: Yes  Psychological Evaluations: Yes  Past Medical History:  Past Medical History:  Diagnosis Date  . Anxiety   . Attempted suicide   . Chronic back pain   . Chronic knee pain   .  Depression   . Insomnia   . PTSD (post-traumatic stress disorder)   . Substance abuse    History reviewed. No pertinent surgical history. Family History:  Family History  Problem Relation Age of Onset  . Diabetes Mother   . Cancer Mother   . Hypertension Mother   . Hypertension Father    Family Psychiatric  History: Patient reports depression does  run in his family Tobacco Screening: Have you used any form of tobacco in the last 30 days? (Cigarettes, Smokeless Tobacco, Cigars, and/or Pipes): Yes Tobacco use, Select all that apply: 5 or more cigarettes per day Are you interested in Tobacco Cessation Medications?: No, patient refused Counseled patient on smoking cessation including recognizing danger situations, developing coping skills and basic information about quitting provided: Refused/Declined practical counseling Social History:  History  Alcohol Use  . 0.0 oz/week    Comment: occasionally      History  Drug Use  . Frequency: 5.0 times per week  . Types: Marijuana, Hydrocodone, Oxycodone, Benzodiazepines    Comment: marijuana last used 07/06/16, heroin last used 07/07/16    Additional Social History:                           Allergies:   Allergies  Allergen Reactions  . Poison Ivy Extract [Poison Ivy Extract]    Lab Results:  Results for orders placed or performed during the hospital encounter of 07/07/16 (from the past 48 hour(s))  Urine rapid drug screen (hosp performed)     Status: Abnormal   Collection Time: 07/07/16  1:40 PM  Result Value Ref Range   Opiates NONE DETECTED NONE DETECTED   Cocaine POSITIVE (A) NONE DETECTED   Benzodiazepines POSITIVE (A) NONE DETECTED   Amphetamines NONE DETECTED NONE DETECTED   Tetrahydrocannabinol POSITIVE (A) NONE DETECTED   Barbiturates NONE DETECTED NONE DETECTED    Comment:        DRUG SCREEN FOR MEDICAL PURPOSES ONLY.  IF CONFIRMATION IS NEEDED FOR ANY PURPOSE, NOTIFY LAB WITHIN 5 DAYS.        LOWEST DETECTABLE LIMITS FOR URINE DRUG SCREEN Drug Class       Cutoff (ng/mL) Amphetamine      1000 Barbiturate      200 Benzodiazepine   193 Tricyclics       790 Opiates          300 Cocaine          300 THC              50   Comprehensive metabolic panel     Status: Abnormal   Collection Time: 07/07/16  2:00 PM  Result Value Ref Range   Sodium 136 135  - 145 mmol/L   Potassium 3.4 (L) 3.5 - 5.1 mmol/L   Chloride 103 101 - 111 mmol/L   CO2 23 22 - 32 mmol/L   Glucose, Bld 88 65 - 99 mg/dL   BUN 18 6 - 20 mg/dL   Creatinine, Ser 0.73 0.61 - 1.24 mg/dL   Calcium 8.9 8.9 - 10.3 mg/dL   Total Protein 7.0 6.5 - 8.1 g/dL   Albumin 4.3 3.5 - 5.0 g/dL   AST 28 15 - 41 U/L   ALT 25 17 - 63 U/L   Alkaline Phosphatase 67 38 - 126 U/L   Total Bilirubin 0.8 0.3 - 1.2 mg/dL   GFR calc non Af Amer >60 >60 mL/min   GFR calc Af  Amer >60 >60 mL/min    Comment: (NOTE) The eGFR has been calculated using the CKD EPI equation. This calculation has not been validated in all clinical situations. eGFR's persistently <60 mL/min signify possible Chronic Kidney Disease.    Anion gap 10 5 - 15  Ethanol     Status: None   Collection Time: 07/07/16  2:00 PM  Result Value Ref Range   Alcohol, Ethyl (B) <5 <5 mg/dL    Comment:        LOWEST DETECTABLE LIMIT FOR SERUM ALCOHOL IS 5 mg/dL FOR MEDICAL PURPOSES ONLY   CBC with Differential     Status: Abnormal   Collection Time: 07/07/16  2:00 PM  Result Value Ref Range   WBC 13.1 (H) 4.0 - 10.5 K/uL   RBC 5.04 4.22 - 5.81 MIL/uL   Hemoglobin 16.1 13.0 - 17.0 g/dL   HCT 45.1 39.0 - 52.0 %   MCV 89.5 78.0 - 100.0 fL   MCH 31.9 26.0 - 34.0 pg   MCHC 35.7 30.0 - 36.0 g/dL   RDW 12.8 11.5 - 15.5 %   Platelets 272 150 - 400 K/uL    Comment: SPECIMEN CHECKED FOR CLOTS PLATELET COUNT CONFIRMED BY SMEAR LARGE PLATELETS PRESENT GIANT PLATELETS SEEN    Neutrophils Relative % 63 %   Neutro Abs 8.3 (H) 1.7 - 7.7 K/uL   Lymphocytes Relative 27 %   Lymphs Abs 3.5 0.7 - 4.0 K/uL   Monocytes Relative 4 %   Monocytes Absolute 0.6 0.1 - 1.0 K/uL   Eosinophils Relative 5 %   Eosinophils Absolute 0.7 0.0 - 0.7 K/uL   Basophils Relative 1 %   Basophils Absolute 0.1 0.0 - 0.1 K/uL   WBC Morphology ATYPICAL LYMPHOCYTES     Comment: WHITE COUNT CONFIRMED ON SMEAR  Salicylate level     Status: None   Collection  Time: 07/07/16  2:00 PM  Result Value Ref Range   Salicylate Lvl <7.9 2.8 - 30.0 mg/dL  Acetaminophen level     Status: Abnormal   Collection Time: 07/07/16  2:00 PM  Result Value Ref Range   Acetaminophen (Tylenol), Serum <10 (L) 10 - 30 ug/mL    Comment:        THERAPEUTIC CONCENTRATIONS VARY SIGNIFICANTLY. A RANGE OF 10-30 ug/mL MAY BE AN EFFECTIVE CONCENTRATION FOR MANY PATIENTS. HOWEVER, SOME ARE BEST TREATED AT CONCENTRATIONS OUTSIDE THIS RANGE. ACETAMINOPHEN CONCENTRATIONS >150 ug/mL AT 4 HOURS AFTER INGESTION AND >50 ug/mL AT 12 HOURS AFTER INGESTION ARE OFTEN ASSOCIATED WITH TOXIC REACTIONS.   Acetaminophen level     Status: Abnormal   Collection Time: 07/07/16  4:55 PM  Result Value Ref Range   Acetaminophen (Tylenol), Serum <10 (L) 10 - 30 ug/mL    Comment:        THERAPEUTIC CONCENTRATIONS VARY SIGNIFICANTLY. A RANGE OF 10-30 ug/mL MAY BE AN EFFECTIVE CONCENTRATION FOR MANY PATIENTS. HOWEVER, SOME ARE BEST TREATED AT CONCENTRATIONS OUTSIDE THIS RANGE. ACETAMINOPHEN CONCENTRATIONS >150 ug/mL AT 4 HOURS AFTER INGESTION AND >50 ug/mL AT 12 HOURS AFTER INGESTION ARE OFTEN ASSOCIATED WITH TOXIC REACTIONS.   Salicylate level     Status: None   Collection Time: 07/07/16  4:55 PM  Result Value Ref Range   Salicylate Lvl <3.9 2.8 - 30.0 mg/dL  Magnesium     Status: None   Collection Time: 07/07/16  5:57 PM  Result Value Ref Range   Magnesium 2.0 1.7 - 2.4 mg/dL    Blood Alcohol level:  Lab  Results  Component Value Date   ETH <5 07/07/2016   ETH <11 93/71/6967    Metabolic Disorder Labs:  No results found for: HGBA1C, MPG No results found for: PROLACTIN No results found for: CHOL, TRIG, HDL, CHOLHDL, VLDL, LDLCALC  Current Medications: Current Facility-Administered Medications  Medication Dose Route Frequency Provider Last Rate Last Dose  . acetaminophen (TYLENOL) tablet 650 mg  650 mg Oral Q6H PRN Laverle Hobby, PA-C      . alum & mag  hydroxide-simeth (MAALOX/MYLANTA) 200-200-20 MG/5ML suspension 30 mL  30 mL Oral Q4H PRN Laverle Hobby, PA-C      . cloNIDine (CATAPRES) tablet 0.1 mg  0.1 mg Oral QID Laverle Hobby, PA-C   0.1 mg at 07/08/16 8938   Followed by  . [START ON 07/10/2016] cloNIDine (CATAPRES) tablet 0.1 mg  0.1 mg Oral BH-qamhs Spencer E Simon, PA-C       Followed by  . [START ON 07/12/2016] cloNIDine (CATAPRES) tablet 0.1 mg  0.1 mg Oral QAC breakfast Laverle Hobby, PA-C      . dicyclomine (BENTYL) tablet 20 mg  20 mg Oral Q6H PRN Laverle Hobby, PA-C      . gabapentin (NEURONTIN) capsule 300 mg  300 mg Oral TID Laverle Hobby, PA-C   300 mg at 07/08/16 1017  . hydrOXYzine (ATARAX/VISTARIL) tablet 25 mg  25 mg Oral Q6H PRN Laverle Hobby, PA-C      . loperamide (IMODIUM) capsule 2-4 mg  2-4 mg Oral PRN Laverle Hobby, PA-C      . magnesium hydroxide (MILK OF MAGNESIA) suspension 30 mL  30 mL Oral Daily PRN Laverle Hobby, PA-C      . methocarbamol (ROBAXIN) tablet 500 mg  500 mg Oral Q8H PRN Laverle Hobby, PA-C      . naproxen (NAPROSYN) tablet 500 mg  500 mg Oral BID PRN Laverle Hobby, PA-C      . ondansetron (ZOFRAN-ODT) disintegrating tablet 4 mg  4 mg Oral Q6H PRN Laverle Hobby, PA-C      . traZODone (DESYREL) tablet 50 mg  50 mg Oral QHS,MR X 1 Spencer E Simon, PA-C       PTA Medications: Prescriptions Prior to Admission  Medication Sig Dispense Refill Last Dose  . clonazePAM (KLONOPIN) 0.5 MG tablet Take 0.5 mg by mouth 4 (four) times daily.   07/06/2016 at Unknown time  . escitalopram (LEXAPRO) 10 MG tablet Take 10 mg by mouth daily.   07/06/2016 at Unknown time  . gabapentin (NEURONTIN) 800 MG tablet Take 800 mg by mouth 3 (three) times daily.   07/06/2016 at Unknown time  . ibuprofen (ADVIL,MOTRIN) 200 MG tablet Take 200 mg by mouth every 6 (six) hours as needed for moderate pain.   unknown  . traZODone (DESYREL) 100 MG tablet Take 100 mg by mouth at bedtime.   07/06/2016 at Unknown time     Musculoskeletal: Strength & Muscle Tone: within normal limits Gait & Station: in bed Patient leans: N/A  Psychiatric Specialty Exam: Physical Exam  ROS  Blood pressure 117/72, pulse (!) 51, temperature 97.7 F (36.5 C), temperature source Oral, resp. rate 18, height '5\' 5"'  (1.651 m), weight 91.2 kg (201 lb), SpO2 96 %.Body mass index is 33.45 kg/m.  General Appearance: Casual  Eye Contact:  Fair  Speech:  Clear and Coherent  Volume:  Normal  Mood:  Dysphoric  Affect:  Appropriate  Thought Process:  Coherent  Orientation:  Negative  Thought Content:  Negative  Suicidal Thoughts:  No  Homicidal Thoughts:  No  Memory:  Negative  Judgement:  Fair  Insight:  Present  Psychomotor Activity:  Normal  Concentration:  Attention Span: Fair  Recall:  [place] of Knowledge:  Good  Language:  Good  Akathisia:  No  Handed:  Right  AIMS (if indicated):     Assets:  Resilience  ADL's:  Intact  Cognition:  WNL  Sleep:  Number of Hours: 2.25    Treatment Plan Summary: Daily contact with patient to assess and evaluate symptoms and progress in treatment and We'll gather more history as patient is more able to participate. Currently we'll not begin any definitive psychiatric medication treatment until patient has progressed further in his detoxification so that we will be able to accurately assess his response. Patient may be interested in going to longer term treatment and will meet with the social worker to discuss options.  Observation Level/Precautions:  15 minute checks  Laboratory:  see labs  Psychotherapy:    Medications:    Consultations:    Discharge Concerns:    Estimated LOS:  Other:     Physician Treatment Plan for Primary Diagnosis: Opioid dependence ([place]) Long Term Goal(s): Improvement in symptoms so as ready for discharge  Short Term Goals: Ability to maintain clinical measurements within normal limits will improve  Physician Treatment Plan for Secondary  Diagnosis: Principal Problem:   Opioid dependence ([place]) Active Problems:   Opioid withdrawal ([place])  Long Term Goal(s): Improvement in symptoms so as ready for discharge  Short Term Goals: Ability to identify changes in lifestyle to reduce recurrence of condition will improve and Ability to identify triggers associated with substance abuse/mental health issues will improve  I certify that inpatient services furnished can reasonably be expected to improve the patient's condition.    Linard Millers, MD 11/30/201711:25 AM

## 2016-07-08 NOTE — Plan of Care (Signed)
Problem: Safety: Goal: Periods of time without injury will increase Outcome: Progressing No self harm behaviors observed or expressed

## 2016-07-08 NOTE — [facility] Counselor (Addendum)
Adult Comprehensive Assessment  Patient ID: Rayann HemanSean J Grounds, male   DOB: Dec 21, 1985, 30 y.o.   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 Financial / Lack of resources (include bankruptcy): no income Family Relationships: patient's substance abuse has put a strain on relationships Substance abuse: daily opioid abuse, regular cocaine abuse, occasional alcohol abuse Bereavement / Loss: Witnessed brother die in 2006 from an overdose  Living/Environment/Situation:  Living conditions (as described by patient or guardian): Pt lives [place] mother [place] step[place]   How long has patient lived in current situation?: Since 2015 What is atmosphere in current home: Comfortable  Family History:  Marital status: Single Does patient have children?: No  Childhood History:  By whom was/is the patient raised?: Mother;Father Additional childhood history information: Pt states that he had a good childhood.  Description of patient's relationship with caregiver when they were a child: Pt reports getting along well with parents growing up.  Patient's description of current relationship with people who raised him/her: Pt reports still getting along well with parents today but reports that his addiction has put a strain on family relationships Does patient have siblings?: Yes Number of Siblings: 1 Description of patient's current relationship with siblings: Brother is deceased Did patient suffer any verbal/emotional/physical/sexual abuse as a child?: No Did patient suffer from severe childhood neglect?: No Has patient ever been sexually abused/assaulted/raped as an adolescent or adult?: No Was the patient ever a victim of a crime or a disaster?: No Witnessed domestic violence?: No Has patient been effected by domestic violence as an adult?:  No  Education:  Highest grade of school patient has completed: some college Currently a Consulting civil engineerstudent?: No Learning disability?: No  Employment/Work Situation:   Employment situation: Unemployed- 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 Patient's job has been impacted by current illness: No What is the longest time patient has a held a job?: 3 years Where was the patient employed at that time?: [employer] Has patient ever been in the [employer] and Companymilitary?: No Has patient ever served in Buyer, retailcombat?: No  Financial Resources:   Surveyor, quantityinancial resources: No income Does patient have a Lawyerrepresentative payee or guardian?: No  Alcohol/Substance Abuse:   What has been your use of drugs/alcohol within the last 12 months?: daily opioid abuse, regular cocaine abuse, occasional alcohol abuse If attempted suicide, did drugs/alcohol play a role in this?: No Alcohol/Substance Abuse Treatment Hx: [facility] in 2006, 2014, 2015 If yes, describe treatment: N/A Has alcohol/substance abuse ever caused legal problems?: No  Social Support System:   Patient's Community Support System: Good Describe Community Support System: Pt reports family is supportive Type of faith/religion: Ephriam KnucklesChristian How does patient's faith help to cope with current illness?: prayer  Leisure/Recreation:   Leisure and Hobbies: art - drawing and painting  Strengths/Needs:   What things does the patient do well?: drawing In what areas does patient struggle / problems for patient: Depression, anxiety, SI, substance abuse  Discharge Plan:   Does patient have access to transportation?: Yes Will patient be returning to same living situation after discharge?: No- patient is requesting residential tx at discharge Currently receiving community mental health services: Yes- [facility]  If no, would patient like referral for services when discharged?: Yes (What county?) ([place] or surrounding counties for residential  treatment) Does patient have financial barriers related to discharge  medications?: Yes- no insurance or income  Summary/Recommendations:     Patient is a 30 year old Caucasian Male with a diagnosis of Opioid Use Disorder, Severe. No triggers for current admission identified other than continued addiction. Patient will benefit from crisis stabilization, medication evaluation, group therapy and psycho education in addition to case management for discharge planning.    Samuella BruinKristin Alexx Mcburney, LCSW Clinical Social Worker [facility] [phone]

## 2016-07-09 MED ORDER — BUPROPION HCL ER (SR) 150 MG PO TB12
150.0000 mg | ORAL_TABLET | Freq: Every day | ORAL | Status: DC
  Administered 2016-07-09 – 2016-07-12 (×4): 150 mg via ORAL
  Filled 2016-07-09 (×6): qty 1

## 2016-07-09 NOTE — Progress Notes (Signed)
Recreation Therapy Notes  Date: 07/09/16 Time: 0930 Location: 300 [address] Dayroom  Group Topic: Stress Management  Goal Area(s) Addresses:  Patient will verbalize importance of using healthy stress management.  Patient will identify positive emotions associated with healthy stress management.   Behavioral Response: Engaged  Intervention: Stress Management  Activity :  Guided Imagery.  LRT introduced the stress management technique of guided imagery.  LRT read a script so patients could experience the concept of guided imagery.  Patients were to follow along as LRT read script to engage in activity.  Education:  Stress Management, Discharge Planning.   Education Outcome: Acknowledges edcuation/In group clarification offered/Needs additional education  Clinical Observations/Feedback: Pt attended group.   Ryan Kelley, Ryan Kelley         Ryan Kelley, Ryan Kelley A 07/09/2016 11:40 AM

## 2016-07-09 NOTE — Progress Notes (Signed)
Psychoeducational Group Note  Date:  07/09/2016 Time:  0054  Group Topic/Focus:  Wrap-Up Group:   The focus of this group is to help patients review their daily goal of treatment and discuss progress on daily workbooks.   Participation Level: Did Not Attend  Participation Quality:  Not Applicable  Affect:  Not Applicable  Cognitive:  Not Applicable  Insight:  Not Applicable  Engagement in Group: Not Applicable  Additional Comments:  The patient did not attend group last evening.    Alicea Wente S 07/09/2016, 12:54 AM

## 2016-07-09 NOTE — [facility] Group Notes (Addendum)
[facility] LCSW Group Therapy 07/09/2016 1:15 PM Type of Therapy: Group Therapy Participation Level: Minimal  Participation Quality: Attentive, Sharing and Supportive  Affect: Depressed and Flat  Cognitive: Alert and Oriented  Insight: Developing/Improving and Engaged  Engagement in Therapy: Developing/Improving and Engaged  Modes of Intervention: Clarification, Confrontation, Discussion, Education, Exploration, Limit-setting, Orientation, Problem-solving, Rapport Building, Dance movement psychotherapisteality Testing, Socialization and Support  Summary of Progress/Problems: The topic for today was feelings about relapse. Pt discussed what relapse prevention is to them and identified triggers that they are on the path to relapse. Pt processed their feeling towards relapse and was able to relate to peers. Pt discussed coping skills that can be used for relapse prevention. Patient entered group halfway through and did not participate in discussion despite CSW encouragement.    Samuella BruinKristin Elsy Chiang, MSW, LCSW Clinical Social Worker [facility] [phone]

## 2016-07-09 NOTE — Progress Notes (Signed)
Patient attended AA group meeting.  

## 2016-07-09 NOTE — Progress Notes (Signed)
D:Pt rates depression and anxiety as a 5 on 0-10 scale with 10 being the most. Pt stayed in his room with minimal interaction this morning and has been in the dayroom this afternoon. Pt refused morning and afternoon medications. He reports no withdrawal symptoms. Pt did take Wellbutrin this morning.  A:Offered support, encouragement and 15 minute checks.  R:Pt denies si and hi. Safety maintained on the unit.

## 2016-07-09 NOTE — Progress Notes (Signed)
Writer spoke with Ryan Kelley 1:1 and he reports having had a good day. He attended group this evening and was compliant with his medications. He was observed up in the dayroom briefly tonight and returned to his room after medications. Support given and safety maintained on unit

## 2016-07-09 NOTE — Progress Notes (Signed)
[facility] MD Progress Note  07/09/2016 10:40 AM  Patient Active Problem List   Diagnosis Date Noted  . Opioid withdrawal ([place]) 07/08/2016  . Opioid dependence ([place]) 10/18/2012  . Opioid use with withdrawal ([place]) 10/18/2012    Diagnosis: Opiate use disorder with withdrawal uncomplicated history of depression  Subjective: Patient reports he is feeling a little bit better today. He is up and in the day room when I meet with him and have him come to my office. He reports that he would like a trial of an antidepressant at present and after discussion of the risks, benefits, side effects, expected effects, dosing he does agree to a trial of Wellbutrin starting at 150 mg by mouth every morning. He denies any current suicidal or homicidal ideation, plan or intent.  Objective: Well-developed well-nourished man in no apparent distress mood is all right affect is unremarkable speech and motor are within normal limits thought processes linear and goal-directed thought content denies current suicidal or homicidal ideation, plan or intent, alert and oriented 3 insight and judgment are fair to limited, IQ appears in average range     Current Facility-Administered Medications (Cardiovascular):  .  cloNIDine (CATAPRES) tablet 0.1 mg **FOLLOWED BY** [START ON 07/10/2016] cloNIDine (CATAPRES) tablet 0.1 mg **FOLLOWED BY** [START ON 07/12/2016] cloNIDine (CATAPRES) tablet 0.1 mg     Current Facility-Administered Medications (Analgesics):  .  acetaminophen (TYLENOL) tablet 650 mg .  naproxen (NAPROSYN) tablet 500 mg     Current Facility-Administered Medications (Other):  .  alum & mag hydroxide-simeth (MAALOX/MYLANTA) 200-200-20 MG/5ML suspension 30 mL .  buPROPion (WELLBUTRIN SR) 12 hr tablet 150 mg .  dicyclomine (BENTYL) tablet 20 mg .  gabapentin (NEURONTIN) capsule 300 mg .  hydrOXYzine (ATARAX/VISTARIL) tablet 25 mg .  loperamide (IMODIUM) capsule 2-4 mg .  magnesium hydroxide (MILK OF MAGNESIA)  suspension 30 mL .  methocarbamol (ROBAXIN) tablet 500 mg .  ondansetron (ZOFRAN-ODT) disintegrating tablet 4 mg .  traZODone (DESYREL) tablet 50 mg  No current outpatient prescriptions on file.  Vital Signs:Blood pressure 110/65, pulse 71, temperature 98.2 F (36.8 C), temperature source Oral, resp. rate 18, height '5\' 5"'  (1.651 m), weight 91.2 kg (201 lb), SpO2 96 %.    Lab Results:  Results for orders placed or performed during the hospital encounter of 07/07/16 (from the past 48 hour(s))  Urine rapid drug screen (hosp performed)     Status: Abnormal   Collection Time: 07/07/16  1:40 PM  Result Value Ref Range   Opiates NONE DETECTED NONE DETECTED   Cocaine POSITIVE (A) NONE DETECTED   Benzodiazepines POSITIVE (A) NONE DETECTED   Amphetamines NONE DETECTED NONE DETECTED   Tetrahydrocannabinol POSITIVE (A) NONE DETECTED   Barbiturates NONE DETECTED NONE DETECTED    Comment:        DRUG SCREEN FOR MEDICAL PURPOSES ONLY.  IF CONFIRMATION IS NEEDED FOR ANY PURPOSE, NOTIFY LAB WITHIN 5 DAYS.        LOWEST DETECTABLE LIMITS FOR URINE DRUG SCREEN Drug Class       Cutoff (ng/mL) Amphetamine      1000 Barbiturate      200 Benzodiazepine   509 Tricyclics       326 Opiates          300 Cocaine          300 THC              50   Comprehensive metabolic panel     Status: Abnormal  Collection Time: 07/07/16  2:00 PM  Result Value Ref Range   Sodium 136 135 - 145 mmol/L   Potassium 3.4 (L) 3.5 - 5.1 mmol/L   Chloride 103 101 - 111 mmol/L   CO2 23 22 - 32 mmol/L   Glucose, Bld 88 65 - 99 mg/dL   BUN 18 6 - 20 mg/dL   Creatinine, Ser 0.73 0.61 - 1.24 mg/dL   Calcium 8.9 8.9 - 10.3 mg/dL   Total Protein 7.0 6.5 - 8.1 g/dL   Albumin 4.3 3.5 - 5.0 g/dL   AST 28 15 - 41 U/L   ALT 25 17 - 63 U/L   Alkaline Phosphatase 67 38 - 126 U/L   Total Bilirubin 0.8 0.3 - 1.2 mg/dL   GFR calc non Af Amer >60 >60 mL/min   GFR calc Af Amer >60 >60 mL/min    Comment: (NOTE) The eGFR has  been calculated using the CKD EPI equation. This calculation has not been validated in all clinical situations. eGFR's persistently <60 mL/min signify possible Chronic Kidney Disease.    Anion gap 10 5 - 15  Ethanol     Status: None   Collection Time: 07/07/16  2:00 PM  Result Value Ref Range   Alcohol, Ethyl (B) <5 <5 mg/dL    Comment:        LOWEST DETECTABLE LIMIT FOR SERUM ALCOHOL IS 5 mg/dL FOR MEDICAL PURPOSES ONLY   CBC with Differential     Status: Abnormal   Collection Time: 07/07/16  2:00 PM  Result Value Ref Range   WBC 13.1 (H) 4.0 - 10.5 K/uL   RBC 5.04 4.22 - 5.81 MIL/uL   Hemoglobin 16.1 13.0 - 17.0 g/dL   HCT 45.1 39.0 - 52.0 %   MCV 89.5 78.0 - 100.0 fL   MCH 31.9 26.0 - 34.0 pg   MCHC 35.7 30.0 - 36.0 g/dL   RDW 12.8 11.5 - 15.5 %   Platelets 272 150 - 400 K/uL    Comment: SPECIMEN CHECKED FOR CLOTS PLATELET COUNT CONFIRMED BY SMEAR LARGE PLATELETS PRESENT GIANT PLATELETS SEEN    Neutrophils Relative % 63 %   Neutro Abs 8.3 (H) 1.7 - 7.7 K/uL   Lymphocytes Relative 27 %   Lymphs Abs 3.5 0.7 - 4.0 K/uL   Monocytes Relative 4 %   Monocytes Absolute 0.6 0.1 - 1.0 K/uL   Eosinophils Relative 5 %   Eosinophils Absolute 0.7 0.0 - 0.7 K/uL   Basophils Relative 1 %   Basophils Absolute 0.1 0.0 - 0.1 K/uL   WBC Morphology ATYPICAL LYMPHOCYTES     Comment: WHITE COUNT CONFIRMED ON SMEAR  Salicylate level     Status: None   Collection Time: 07/07/16  2:00 PM  Result Value Ref Range   Salicylate Lvl <7.4 2.8 - 30.0 mg/dL  Acetaminophen level     Status: Abnormal   Collection Time: 07/07/16  2:00 PM  Result Value Ref Range   Acetaminophen (Tylenol), Serum <10 (L) 10 - 30 ug/mL    Comment:        THERAPEUTIC CONCENTRATIONS VARY SIGNIFICANTLY. A RANGE OF 10-30 ug/mL MAY BE AN EFFECTIVE CONCENTRATION FOR MANY PATIENTS. HOWEVER, SOME ARE BEST TREATED AT CONCENTRATIONS OUTSIDE THIS RANGE. ACETAMINOPHEN CONCENTRATIONS >150 ug/mL AT 4 HOURS AFTER INGESTION  AND >50 ug/mL AT 12 HOURS AFTER INGESTION ARE OFTEN ASSOCIATED WITH TOXIC REACTIONS.   Acetaminophen level     Status: Abnormal   Collection Time: 07/07/16  4:55 PM  Result Value Ref Range   Acetaminophen (Tylenol), Serum <10 (L) 10 - 30 ug/mL    Comment:        THERAPEUTIC CONCENTRATIONS VARY SIGNIFICANTLY. A RANGE OF 10-30 ug/mL MAY BE AN EFFECTIVE CONCENTRATION FOR MANY PATIENTS. HOWEVER, SOME ARE BEST TREATED AT CONCENTRATIONS OUTSIDE THIS RANGE. ACETAMINOPHEN CONCENTRATIONS >150 ug/mL AT 4 HOURS AFTER INGESTION AND >50 ug/mL AT 12 HOURS AFTER INGESTION ARE OFTEN ASSOCIATED WITH TOXIC REACTIONS.   Salicylate level     Status: None   Collection Time: 07/07/16  4:55 PM  Result Value Ref Range   Salicylate Lvl <3.1 2.8 - 30.0 mg/dL  Magnesium     Status: None   Collection Time: 07/07/16  5:57 PM  Result Value Ref Range   Magnesium 2.0 1.7 - 2.4 mg/dL    Physical Findings: AIMS: Facial and Oral Movements Muscles of Facial Expression: None, normal Lips and Perioral Area: None, normal Jaw: None, normal Tongue: None, normal,Extremity Movements Upper (arms, wrists, hands, fingers): None, normal Lower (legs, knees, ankles, toes): None, normal, Trunk Movements Neck, shoulders, hips: None, normal, Overall Severity Severity of abnormal movements (highest score from questions above): None, normal Incapacitation due to abnormal movements: None, normal Patient's awareness of abnormal movements (rate only patient's report): No Awareness, Dental Status Current problems with teeth and/or dentures?: No Does patient usually wear dentures?: No  CIWA:    COWS:  COWS Total Score: 4   Assessment/Plan: Appears to be detoxing from opiates without incident. Feels ready for a trial of an antidepressant and will begin on Wellbutrin as above and this will be monitored for any side effects and effectiveness. Patient is meeting with social work to explore options for further treatment for  substance use disorder.  Linard Millers, MD 07/09/2016, 10:40 AM

## 2016-07-10 DIAGNOSIS — F0630 Mood disorder due to known physiological condition, unspecified: Secondary | ICD-10-CM

## 2016-07-10 DIAGNOSIS — F1123 Opioid dependence with withdrawal: Principal | ICD-10-CM

## 2016-07-10 NOTE — Progress Notes (Signed)
D:  Per pt self inventory pt reports sleeping poor, appetite good, energy level high, ability to pay attention good, rates depression at a 5 out of 10, hopelessness at a 5 out of 10, anxiety at a 5 out of 10, denies SI/HI/AVH, goal today: "Chill out making, relating and coolin, breathe concentrate on being relaxed."     A:  Emotional support provided, Encouraged pt to continue with treatment plan and attend all group activities, q15 min checks maintained for safety.  R:  Pt is receptive, going to groups, pleasant and cooperative with staff and other patients on the unit.

## 2016-07-10 NOTE — [facility] Group Notes (Signed)
Adult Therapy Group Note  Date:  07/10/2016  Time:  10:00-11:00AM  Group Topic/Focus: Fears and Healthy/Unhealthy Coping Skills  Building Self Esteem:   The Focus of this group was to discuss some of the prevalent fears that patients experience, and to identify the commonalities among group members.  An exercise was used to initiate the discussion, followed by writing on the white board a group-generated list of unhealthy coping and healthy coping techniques to deal with each fear.  This assisted patients in becoming aware of the differences between healthy and unhealthy coping techniques, as well as how to determine which type they are using.   Reasons for choosing the unhealthy techniques were explored, which helped patients to provide support to each other and to determine that, in fact, they are not alone.    Participation Level:  Active  Participation Quality:  Attentive, Sharing and Supportive  Affect:  Appropriate  Cognitive:  Appropriate  Insight: Good  Engagement in Group:  Engaged  Modes of Intervention:  Discussion, Exploration and Support  Additional Comments:  The patient expressed himself freely throughout group, laughed some and showed broad affective responses.  He remained on topic.  Ambrose MantleMareida Grossman-Orr, LCSW 07/10/2016   12:35pm

## 2016-07-10 NOTE — [facility] Group Notes (Signed)
[facility] Group Notes:  (Nursing/MHT/Case Management/Adjunct)  Date:  07/10/2016  Time:  12:52 PM  Type of Therapy:  Nurse Education  Participation Level:  Active  Participation Quality:  Appropriate  Affect:  Appropriate  Cognitive:  Alert and Appropriate  Insight:  Appropriate  Engagement in Group:  Engaged  Modes of Intervention:  Education  Summary of Progress/Problems:  Patient was able to identify his goal for today as, "Chill out and ride the wave, meaning, I know where I'm going and I know what I'm doing and I'm cool with it". Patient was also able to identify a positive statement to replace a negative self talk statement with.  Almira Barenny G Layth Cerezo 07/10/2016, 12:52 PM

## 2016-07-10 NOTE — Progress Notes (Addendum)
Nursing Progress Note: 7p-7a D: Pt currently presents with a flat/depressed affect and behavior. Pt reports to Clinical research associatewriter that their goal is to "find coping skills for anger and depression." Pt states "listening to music is a huge coping skill of mine. Today, I learned how to deep breath. It was a good day today. A lot of good information." Pt reports good sleep with current medication regimen.   A: Pt provided with medications per providers orders. Pt's labs and vitals were monitored throughout the night. Pt supported emotionally and encouraged to express concerns and questions. Pt educated on medications.  R: Pt's safety ensured with 15 minute and environmental checks. Pt currently denies SI/HI/Self Harm and A/V hallucinations. Pt verbally agrees to seek staff if SI/HI or A/VH occurs and to consult with staff before acting on any harmful thoughts. Will continue POC.

## 2016-07-10 NOTE — [facility] Group Notes (Signed)
Adult Psychoeducational Group Note  Date:  07/10/2016 Time:  4:37 PM  Group Topic/Focus:  Identifying Needs:   The focus of this group is to help patients identify their personal needs that have been historically problematic and identify healthy behaviors to address their needs.   Participation Level:  Active  Participation Quality:  Attentive and Sharing  Affect:  Appropriate  Cognitive:  Alert and Appropriate  Insight: Improving  Engagement in Group:  Engaged  Modes of Intervention:  Discussion, Rapport Building and Socialization  Additional Comments:  Pt was able to share with the group some positive self statements to replace negative self talk.   Lauris Poagndrea B Macrina Lehnert 07/10/2016, 4:37 PM

## 2016-07-10 NOTE — Progress Notes (Signed)
[facility]  07/10/2016 12:01 PM  Patient Active Problem List   Diagnosis Date Noted  . Opioid withdrawal ([facility]) 07/08/2016  . Opioid dependence ([facility]) 10/18/2012  . Opioid use with withdrawal ([facility]) 10/18/2012    Diagnosis: Opiate use disorder with withdrawal uncomplicated history of depression Patient reports doing somewhat better, was started on wellbutrin yesterday for depression Severity of depression improving Anxiety; manageable  tolearting detox.  ROS; no nausea and vomiting  Otherwise alert, oriented, cooperative.  Mood : somewhat dysthymic Affect: congruent  no psychotic symptoms Insight improving No suicidal or homicidal thoughts Speech clear and coherent Eye contact fair Intelligence: fair     Current Facility-Administered Medications (Cardiovascular):  .  [EXPIRED] cloNIDine (CATAPRES) tablet 0.1 mg **FOLLOWED BY** cloNIDine (CATAPRES) tablet 0.1 mg **FOLLOWED BY** [START ON 07/12/2016] cloNIDine (CATAPRES) tablet 0.1 mg     Current Facility-Administered Medications (Analgesics):  .  acetaminophen (TYLENOL) tablet 650 mg .  naproxen (NAPROSYN) tablet 500 mg     Current Facility-Administered Medications (Other):  .  alum & mag hydroxide-simeth (MAALOX/MYLANTA) 200-200-20 MG/5ML suspension 30 mL .  buPROPion (WELLBUTRIN SR) 12 hr tablet 150 mg .  dicyclomine (BENTYL) tablet 20 mg .  gabapentin (NEURONTIN) capsule 300 mg .  hydrOXYzine (ATARAX/VISTARIL) tablet 25 mg .  loperamide (IMODIUM) capsule 2-4 mg .  magnesium hydroxide (MILK OF MAGNESIA) suspension 30 mL .  methocarbamol (ROBAXIN) tablet 500 mg .  ondansetron (ZOFRAN-ODT) disintegrating tablet 4 mg .  traZODone (DESYREL) tablet 50 mg  No current outpatient prescriptions on file.  Vital Signs:Blood pressure 133/72, pulse 67, temperature 98 F (36.7 C), resp. rate 18, height 5\' 5"  (1.651 m), weight 91.2 kg (201 lb), SpO2 96 %.    Lab Results:  No results found for this or any  previous visit (from the past 48 hour(s)).  Physical Findings: AIMS: Facial and Oral Movements Muscles of Facial Expression: None, normal Lips and Perioral Area: None, normal Jaw: None, normal Tongue: None, normal,Extremity Movements Upper (arms, wrists, hands, fingers): None, normal Lower (legs, knees, ankles, toes): None, normal, Trunk Movements Neck, shoulders, hips: None, normal, Overall Severity Severity of abnormal movements (highest score from questions above): None, normal Incapacitation due to abnormal movements: None, normal Patient's awareness of abnormal movements (rate only patient's report): No Awareness, Dental Status Current problems with teeth and/or dentures?: No Does patient usually wear dentures?: No  CIWA:    COWS:  COWS Total Score: 0   Assessment/Plan: Opiate and substance use disorder: continue withdrawal protocol Major depression/Mood disorder; continue wellbutrin Monitor vitals and continue current treatement plan.  Thresa RossAKHTAR, Keidrick Murty, MD 07/10/2016, 12:01 PM

## 2016-07-11 NOTE — Progress Notes (Signed)
[facility]  07/11/2016 12:25 PM  Patient Active Problem List   Diagnosis Date Noted  . Opioid withdrawal ([facility]) 07/08/2016  . Opioid dependence ([facility]) 10/18/2012  . Opioid use with withdrawal ([facility]) 10/18/2012    Diagnosis: Opiate use disorder with withdrawal uncomplicated history of depression Patient continues to tolerate detox.  Vitals stable. No craving  Depression: on wellbutrin.  Severity of depression improving Anxiety; manageable    ROS; no tremors or nausea  MSE No change since yesterday. Remains alert and cooperative.  Insight continues to improve.  Otherwise alert, oriented, cooperative.  Mood : somewhat dysthymic Affect: congruent  no psychotic symptoms Insight improving No suicidal or homicidal thoughts Speech clear and coherent Eye contact fair Intelligence: fair     Current Facility-Administered Medications (Cardiovascular):  .  [EXPIRED] cloNIDine (CATAPRES) tablet 0.1 mg **FOLLOWED BY** cloNIDine (CATAPRES) tablet 0.1 mg **FOLLOWED BY** [START ON 07/12/2016] cloNIDine (CATAPRES) tablet 0.1 mg     Current Facility-Administered Medications (Analgesics):  .  acetaminophen (TYLENOL) tablet 650 mg .  naproxen (NAPROSYN) tablet 500 mg     Current Facility-Administered Medications (Other):  .  alum & mag hydroxide-simeth (MAALOX/MYLANTA) 200-200-20 MG/5ML suspension 30 mL .  buPROPion (WELLBUTRIN SR) 12 hr tablet 150 mg .  dicyclomine (BENTYL) tablet 20 mg .  gabapentin (NEURONTIN) capsule 300 mg .  hydrOXYzine (ATARAX/VISTARIL) tablet 25 mg .  loperamide (IMODIUM) capsule 2-4 mg .  magnesium hydroxide (MILK OF MAGNESIA) suspension 30 mL .  methocarbamol (ROBAXIN) tablet 500 mg .  ondansetron (ZOFRAN-ODT) disintegrating tablet 4 mg .  traZODone (DESYREL) tablet 50 mg  No current outpatient prescriptions on file.  Vital Signs:Blood pressure 112/74, pulse 85, temperature 98.5 F (36.9 C), temperature source Oral, resp. rate 20, height 5'  5" (1.651 m), weight 91.2 kg (201 lb), SpO2 96 %.    Lab Results:  No results found for this or any previous visit (from the past 48 hour(s)).  Physical Findings: AIMS: Facial and Oral Movements Muscles of Facial Expression: None, normal Lips and Perioral Area: None, normal Jaw: None, normal Tongue: None, normal,Extremity Movements Upper (arms, wrists, hands, fingers): None, normal Lower (legs, knees, ankles, toes): None, normal, Trunk Movements Neck, shoulders, hips: None, normal, Overall Severity Severity of abnormal movements (highest score from questions above): None, normal Incapacitation due to abnormal movements: None, normal Patient's awareness of abnormal movements (rate only patient's report): No Awareness, Dental Status Current problems with teeth and/or dentures?: No Does patient usually wear dentures?: No  CIWA:    COWS:  COWS Total Score: 2   Assessment/Plan: Opiate and substance use disorder: continue detox opiate protocol Major depression/Mood disorder; continue wellbutrin for depression  Monitor vitals and continue current treatement plan.  Thresa RossAKHTAR, Aerika Groll, MD 07/11/2016, 12:25 PM

## 2016-07-11 NOTE — Progress Notes (Signed)
Patient did not attend the evening speaker AA meeting. Pt was notified that group was beginning but returned to his room.   

## 2016-07-11 NOTE — Progress Notes (Signed)
D Gregary SignsSean is seen out in the milieu..influenza the dayroom watching TV and talking on the phone A  He is cooperative, pleasant and he deneis SI on his daily assessment. He rates his depression, hopelessness and anxiety " 5/5/5", respectively.  HE says " I'm hoping to be dc'd tomorrow "  R Safety in palce

## 2016-07-11 NOTE — Progress Notes (Signed)
D.  Pt in dayroom on approach, denies complaints at this time.  Pt is hopeful for discharge early tomorrow, has ride planned.  Pt with minimal interaction on unit this evening, went to bed early.  Pt denies SI/HI/hallucinations at this time.  A.  Support and encouragement offered, medication given as ordered.  R.  Pt remains safe on the unit, will continue to monitor.

## 2016-07-11 NOTE — [facility] Group Notes (Signed)
[facility] Group Notes:  (Nursing/MHT/Case Management/Adjunct)  Date:  07/11/2016  Time:  5:16 PM  Type of Therapy:  Nurse Education  Participation Level:  Did Not Attend   Ryan Kelley 07/11/2016, 5:16 PM

## 2016-07-11 NOTE — [facility] Group Notes (Signed)
[facility] Group Notes:  (Clinical Social Work)  07/11/2016  10:00-11:00AM  Summary of Progress/Problems:   The main focus of today's process group was to   1)  discuss the importance of adding supports  2)  define health supports versus unhealthy supports  3)  identify the patient's current healthy unhealthy supports and   4)  Introduce tapping.  An emphasis was placed on using counselor, doctor, therapy groups, 12-step groups, and problem-specific support groups to expand supports.    The patient expressed full comprehension of the concepts presented, and agreed that there is a need to add more supports.  The patient stated his girlfriend is a healthy support for him, while his girlfriend's brother is unhealthy because he always smokes in front of pt.  He participated fully in the discussion and the tapping exercise, and felt that the tapping helped him to relax immediately.  Type of Therapy:  Process Group  Participation Level:  Active  Participation Quality:  Attentive and Sharing  Affect:  Appropriate  Cognitive:  Appropriate and Oriented  Insight:  Engaged  Engagement in Therapy:  Engaged  Modes of Intervention:   Education, Support and Processing, Activity  Ambrose MantleMareida Grossman-Orr, LCSW 07/11/2016

## 2016-07-12 MED ORDER — TRAZODONE HCL 50 MG PO TABS: 50.0000 mg | ORAL_TABLET | Freq: Every evening | ORAL | 0 refills | Status: DC | PRN

## 2016-07-12 MED ORDER — GABAPENTIN 300 MG PO CAPS: 300.0000 mg | ORAL_CAPSULE | Freq: Three times a day (TID) | ORAL | 0 refills | Status: DC

## 2016-07-12 MED ORDER — HYDROXYZINE HCL 25 MG PO TABS: 25.0000 mg | ORAL_TABLET | Freq: Four times a day (QID) | ORAL | 0 refills | Status: DC | PRN

## 2016-07-12 MED ORDER — BUPROPION HCL ER (SR) 150 MG PO TB12: 150.0000 mg | ORAL_TABLET | Freq: Every day | ORAL | 0 refills | Status: DC

## 2016-07-12 NOTE — Discharge Summary (Signed)
Physician Discharge Summary Note  Patient:  Ryan Kelley is an 30 y.o., male MRN:  [id number] DOB:  Aug 24, 1985 Patient phone:  [phone] (home)  Patient address:   [address] [place] [address],  Total Time spent with patient: 30 minutes  Date of Admission:  07/08/2016 Date of Discharge: 07/12/2016  Reason for Admission:  Alcohol abuse  Principal Problem: Opioid dependence [facility]) Discharge Diagnoses: Patient Active Problem List   Diagnosis Date Noted  . Opioid withdrawal (HCC) [F11.23] 07/08/2016  . Opioid dependence (HCC) [F11.20] 10/18/2012  . Opioid use with withdrawal [facility]) [F11.93] 10/18/2012    Past Psychiatric History: see HPI  Past Medical History:  Past Medical History:  Diagnosis Date  . Anxiety   . Attempted suicide   . Chronic back pain   . Chronic knee pain   . Depression   . Insomnia   . PTSD (post-traumatic stress disorder)   . Substance abuse    History reviewed. No pertinent surgical history. Family History:  Family History  Problem Relation Age of Onset  . Diabetes Mother   . Cancer Mother   . Hypertension Mother   . Hypertension Father    Family Psychiatric  History: see HPI Social History:  History  Alcohol Use  . 0.0 oz/week    Comment: occasionally      History  Drug Use  . Frequency: 5.0 times per week  . Types: Marijuana, Hydrocodone, Oxycodone, Benzodiazepines    Comment: marijuana last used 07/06/16, heroin last used 07/07/16    Social History   Social History  . Marital status: Single    Spouse name: N/A  . Number of children: N/A  . Years of education: N/A   Social History Main Topics  . Smoking status: Current Every Day Smoker    Packs/day: 1.00    Years: 7.00    Types: Cigarettes  . Smokeless tobacco: Never Used  . Alcohol use 0.0 oz/week     Comment: occasionally   . Drug use:     Frequency: 5.0 times per week    Types: Marijuana, Hydrocodone, Oxycodone, Benzodiazepines     Comment: marijuana  last used 07/06/16, heroin last used 07/07/16  . Sexual activity: Not Asked   Other Topics Concern  . None   Social History Narrative  . None    Hospital Course:  Ryan Kelley is an 30 y.o. male who was initially seen at  the [facility] voluntarily tonight c/o SI with a plan to OD on Opioids which he uses daily. Pt denies HI, SHI and AVH. Pt sts that he began to feel increasingly depressed.    Ryan Kelley was admitted for Opioid dependence (HCC) and crisis management.  Patient was treated with medications with their indications listed below in detail under Medication List.  Medical problems were identified and treated as needed.  Home medications were restarted as appropriate.  Improvement was monitored by observation and Barnett Abu Kulikowski daily report of symptom reduction.  Emotional and mental status was monitored by daily self inventory reports completed by [name] and clinical staff.  Patient reported continued improvement, denied any new concerns.  Patient had been compliant on medications and denied side effects.  Support and encouragement was provided.    Ryan Kelley was evaluated by the treatment team for stability and plans for continued recovery upon discharge.  Patient was offered further treatment options upon discharge including Residential, Intensive Outpatient and Outpatient treatment. Patient will follow up with  agency listed below for medication management and counseling.  Encouraged patient to maintain satisfactory support network and home environment.  Advised to adhere to medication compliance and outpatient treatment follow up.  Prescriptions provided.       Rayann HemanSean J Antuna motivation was an integral factor for scheduling further treatment.  Employment, transportation, bed availability, health status, family support, and any pending legal issues were also considered during patient's hospital stay.  Upon completion of this admission the patient was both mentally and medically stable  for discharge denying suicidal/homicidal ideation, auditory/visual/tactile hallucinations, delusional thoughts and paranoia.      Physical Findings: AIMS: Facial and Oral Movements Muscles of Facial Expression: None, normal Lips and Perioral Area: None, normal Jaw: None, normal Tongue: None, normal,Extremity Movements Upper (arms, wrists, hands, fingers): None, normal Lower (legs, knees, ankles, toes): None, normal, Trunk Movements Neck, shoulders, hips: None, normal, Overall Severity Severity of abnormal movements (highest score from questions above): None, normal Incapacitation due to abnormal movements: None, normal Patient's awareness of abnormal movements (rate only patient's report): No Awareness, Dental Status Current problems with teeth and/or dentures?: No Does patient usually wear dentures?: No  CIWA:    COWS:  COWS Total Score: 0  Musculoskeletal: Strength & Muscle Tone: within normal limits Gait & Station: normal Patient leans: N/A  Psychiatric Specialty Exam:  SEE MD SRA Physical Exam  Nursing note and vitals reviewed.   ROS  Blood pressure 123/68, pulse 86, temperature 98.4 F (36.9 C), temperature source Oral, resp. rate 16, height 5\' 5"  (1.651 m), weight 91.2 kg (201 lb), SpO2 96 %.Body mass index is 33.45 kg/m.   Have you used any form of tobacco in the last 30 days? (Cigarettes, Smokeless Tobacco, Cigars, and/or Pipes): Yes  Has this patient used any form of tobacco in the last 30 days? (Cigarettes, Smokeless Tobacco, Cigars, and/or Pipes) Yes, N/A  Blood Alcohol level:  Lab Results  Component Value Date   ETH <5 07/07/2016   ETH <11 12/04/2013    Metabolic Disorder Labs:  No results found for: HGBA1C, MPG No results found for: PROLACTIN No results found for: CHOL, TRIG, HDL, CHOLHDL, VLDL, LDLCALC  See Psychiatric Specialty Exam and Suicide Risk Assessment completed by Attending Physician prior to discharge.  Discharge destination:  Home  Is  patient on multiple antipsychotic therapies at discharge:  No   Has Patient had three or more failed trials of antipsychotic monotherapy by history:  No  Recommended Plan for Multiple Antipsychotic Therapies: NA     Medication List    STOP taking these medications   clonazePAM 0.5 MG tablet Commonly known as:  KLONOPIN   escitalopram 10 MG tablet Commonly known as:  LEXAPRO   gabapentin 800 MG tablet Commonly known as:  NEURONTIN Replaced by:  gabapentin 300 MG capsule   ibuprofen 200 MG tablet Commonly known as:  ADVIL,MOTRIN     TAKE these medications     Indication  buPROPion 150 MG 12 hr tablet Commonly known as:  WELLBUTRIN SR Take 1 tablet (150 mg total) by mouth daily. Start taking on:  07/13/2016  Indication:  Major Depressive Disorder   gabapentin 300 MG capsule Commonly known as:  NEURONTIN Take 1 capsule (300 mg total) by mouth 3 (three) times daily. Replaces:  gabapentin 800 MG tablet  Indication:  Agitation   hydrOXYzine 25 MG tablet Commonly known as:  ATARAX/VISTARIL Take 1 tablet (25 mg total) by mouth every 6 (six) hours as needed for anxiety.  Indication:  Anxiety Neurosis   traZODone 50 MG tablet Commonly known as:  DESYREL Take 1 tablet (50 mg total) by mouth at bedtime and may repeat dose one time if needed. What changed:  medication strength  how much to take  when to take this  Indication:  Major Depressive Disorder      Follow-up Information    [facility] Follow up on 07/12/2016.   Why:  Screening assessment for possible admission on Monday 07/12/16 at 11:45am. Please bring [place] ID or proof of residency, insurance cards, along with a 30 day supply of medications and clothing in case of possible admission. Call to reschedule. Contact information: [address] [place] [address] [phone]        [facility].   Specialty:  Behavioral Health Why:  If interested in outpatient services,  Please go to walk-in clinic Monday-Friday at 8am for assessment for therapy and medication management services.  Contact informationElpidio Eric: [address] [place] [address] [phone]           Follow-up recommendations:  Activity:  as tol Diet:  as tol  Comments:  1.  Take all your medications as prescribed.   2.  Report any adverse side effects to outpatient provider. 3.  Patient instructed to not use alcohol or illegal drugs while on prescription medicines. 4.  In the event of worsening symptoms, instructed patient to call 911, the crisis hotline or go to nearest emergency room for evaluation of symptoms.  Signed: Lindwood QuaSheila May Mechille Varghese, NP [facility] 07/12/2016, 9:30 AM

## 2016-07-12 NOTE — Progress Notes (Addendum)
  [facility] :  Will you be returning to the same living situation after discharge:  No, [facility] At discharge, do you have transportation home?: Yes,  family Do you have the ability to pay for your medications: Yes,  patient will be provided with precriptions  Release of information consent forms completed and in the chart;  Patient's signature needed at discharge.  Patient to Follow up at: Follow-up Information    [facility] Follow up on 07/12/2016.   Why:  Screening assessment for possible admission on Monday 07/12/16 at 11:45am. Please bring [place] ID or proof of residency, insurance cards, along with a 30 day supply of medications and clothing in case of possible admission. Call to reschedule. Contact information: [address] [place] [address] [phone]           Next level of care provider has [facility] Link:no  Safety Planning and Suicide Prevention discussed: Yes,  with patient  Have you used any form of tobacco in the last 30 days? (Cigarettes, Smokeless Tobacco, Cigars, and/or Pipes): Yes  Has patient been referred to the Quitline?: Patient refused referral  Patient has been referred for addiction treatment: Yes  Chardae Mulkern L Jaye Saal 07/12/2016, 9:55 AM

## 2016-07-12 NOTE — Progress Notes (Signed)
CSW left message for mother [place] Rorrer [phone] to discuss patient's discharge. CSW waiting return call.  Samuella BruinKristin Rabia Argote, LCSW Clinical Social Worker [facility] [phone]

## 2016-07-12 NOTE — Progress Notes (Signed)
Patient discharged per physician order; patient denies SI/HI and A/V hallucinations; patient received prescriptions, samples, AVS, copy of the suicide safety plan, suicide risk assessment note, and transition record given to the patient after it was reviewed; patient had no other questions or concerns at this time; patient verbalized and signed that all belongings were returned; patient left the unit ambulatory 

## 2016-07-12 NOTE — [facility] Suicide Risk Assessment (Signed)
[facility] INPATIENT:  Family/Significant Other Suicide Prevention Education  Suicide Prevention Education:  Patient Discharged to Other Healthcare Facility:  Suicide Prevention Education Not Provided: {PT. DISCHARGED TO OTHER HEALTHCARE FACILITY:SUICIDE PREVENTION EDUCATION NOT PROVIDED (CHL):  The patient is discharging to another healthcare facility for continuation of treatment.  The patient's medical information, including suicide ideations and risk factors, are a part of the medical information shared with the receiving healthcare facility. SPE reviewed with patient and brochure provided. Patient encouraged to return to hospital if having suicidal thoughts, patient verbalized his/her understanding and has no further questions at this time.   Ryson Bacha L Meloni Hinz 07/12/2016, 10:20 AM

## 2016-07-12 NOTE — [facility] Group Notes (Signed)
Pt attended spiritual care group on grief and loss facilitated by chaplain Burnis KingfisherMatthew Braeden Kennan   Group opened with brief discussion and psycho-social ed around grief and loss in relationships and in relation to self - identifying life patterns, circumstances, changes that cause losses. Established group norm of speaking from own life experience. Group goal of establishing open and affirming space for members to share loss and experience with grief, normalize grief experience and provide psycho social education and grief support.     Ryan Kelley was present at beginning of group.  Was called out of group and discharged during group time.    Ryan CromeStalnaker, Ryan Kelley

## 2016-07-12 NOTE — [facility] Suicide Risk Assessment (Signed)
[facility]   Principal Problem: Opioid dependence [facility](HCC) Discharge Diagnoses:  Patient Active Problem List   Diagnosis Date Noted  . Opioid withdrawal (HCC) [F11.23] 07/08/2016  . Opioid dependence (HCC) [F11.20] 10/18/2012  . Opioid use with withdrawal [facility](HCC) [F11.93] 10/18/2012    Total Time spent with patient: 15 minutes  Musculoskeletal: Strength & Muscle Tone: within normal limits Gait & Station: normal Patient leans: N/A  Psychiatric Specialty Exam: ROS  Blood pressure 123/68, pulse 86, temperature 98.4 F (36.9 C), temperature source Oral, resp. rate 16, height 5\' 5"  (1.651 m), weight 91.2 kg (201 lb), SpO2 96 %.Body mass index is 33.45 kg/m.   General Appearance: Casual  Eye Contact::  Good  Speech:  Clear and Coherent  Volume:  Normal  Mood:  Euthymic  Affect:  Congruent  Thought Process:  Coherent  Orientation:  Full (Time, Place, and Person)  Thought Content:  Negative  Suicidal Thoughts:  No  Homicidal Thoughts:  No  Memory:  Negative  Judgement:  Fair  Insight:  Present  Psychomotor Activity:  Normal  Concentration:  Good  Recall:  Good  Fund of Knowledge:Good  Language: Good  Akathisia:  No  Handed:  Right  AIMS (if indicated):     Assets:  Resilience    Cognition: WNL  ADL's:  Intact     Mental Status Per Nursing Assessment::   On Admission:     Demographic Factors:  Male and Caucasian  Loss Factors: NA  Historical Factors: NA  Risk Reduction Factors:   NA  Continued Clinical Symptoms:  Alcohol/Substance Abuse/Dependencies  Cognitive Features That Contribute To Risk:  None    Suicide Risk:  Mild:  Suicidal ideation of limited frequency, intensity, duration, and specificity.  There are no identifiable plans, no associated intent, mild dysphoria and related symptoms, good self-control (both objective and subjective assessment), few other risk factors, and identifiable protective factors, including  available and accessible social support.  Follow-up Information    [facility] Follow up on 07/12/2016.   Why:  Screening assessment for possible admission on Monday 07/12/16 at 11:45am. Please bring [place] ID or proof of residency, insurance cards, along with a 30 day supply of medications and clothing in case of possible admission. Call to reschedule. Contact information: [place] [address] [place] [address] [phone]        [facility] Follow [facility].   Specialty:  Behavioral Health Why:  If interested in outpatient services, Please go to walk-in clinic Monday-Friday at 8am for assessment for therapy and medication management services.  Contact information: [address] [place] [address] [phone]           Plan Of Care/Follow-up recommendations:  Other:  Patient denies any current suicidal or homicidal ideation, plan or intent. [facility]. He is encouraged to stay with substance use treatment also to follow-up with mental health if needed as an outpatient after discharge.  Acquanetta SitElizabeth Woods Oates, MD 07/12/2016, 9:16 AM

## 2016-07-12 NOTE — Progress Notes (Signed)
Recreation Therapy Notes  Date: 07/12/16 Time: 0930 Location: [address] Dayroom  Group Topic: Stress Management  Goal Area(s) Addresses:  Patient will verbalize importance of using healthy stress management.  Patient will identify positive emotions associated with healthy stress management.   Behavioral Response: Engaged  Intervention: Calm App  Activity :  LRT introduced the stress management concept of meditation.  LRT played a meditation on anxiety from the Calm App.  Patients were to follow along with the app to engage in the meditation.  Education:  Stress Management, Discharge Planning.   Education Outcome: Acknowledges edcuation/In group clarification offered/Needs additional education  Clinical Observations/Feedback: Pt attended group.   Caroll RancherMarjette Angela Vazguez, LRT/CTRS         Caroll RancherLindsay, Lakie Mclouth A 07/12/2016 11:37 AM

## 2016-07-12 NOTE — Tx Team (Signed)
Interdisciplinary Treatment and Diagnostic Plan Update  07/12/2016 Time of Session: 9:30am Ryan HemanSean J Kelley MRN: [id number]  Principal Diagnosis: Opioid dependence (HCC)   Current Medications:  Current Facility-Administered Medications  Medication Dose Route Frequency Provider Last Rate Last Dose  . acetaminophen (TYLENOL) tablet 650 mg  650 mg Oral Q6H PRN Kerry HoughSpencer E Simon, PA-C   650 mg at 07/10/16 2123  . alum & mag hydroxide-simeth (MAALOX/MYLANTA) 200-200-20 MG/5ML suspension 30 mL  30 mL Oral Q4H PRN Kerry HoughSpencer E Simon, PA-C      . buPROPion [facility](WELLBUTRIN SR) 12 hr tablet 150 mg  150 mg Oral Daily Acquanetta SitElizabeth Woods Oates, MD   150 mg at 07/12/16 0820  . cloNIDine (CATAPRES) tablet 0.1 mg  0.1 mg Oral QAC breakfast Kerry HoughSpencer E Simon, PA-C   0.1 mg at 07/12/16 [id number]  . dicyclomine (BENTYL) tablet 20 mg  20 mg Oral Q6H PRN Kerry HoughSpencer E Simon, PA-C      . gabapentin (NEURONTIN) capsule 300 mg  300 mg Oral TID Kerry HoughSpencer E Simon, PA-C   300 mg at 07/12/16 0820  . hydrOXYzine (ATARAX/VISTARIL) tablet 25 mg  25 mg Oral Q6H PRN Kerry HoughSpencer E Simon, PA-C   25 mg at 07/11/16 2122  . loperamide (IMODIUM) capsule 2-4 mg  2-4 mg Oral PRN Kerry HoughSpencer E Simon, PA-C      . magnesium hydroxide (MILK OF MAGNESIA) suspension 30 mL  30 mL Oral Daily PRN Kerry HoughSpencer E Simon, PA-C      . methocarbamol (ROBAXIN) tablet 500 mg  500 mg Oral Q8H PRN Kerry HoughSpencer E Simon, PA-C   500 mg at 07/11/16 2122  . naproxen (NAPROSYN) tablet 500 mg  500 mg Oral BID PRN Kerry HoughSpencer E Simon, PA-C   500 mg at 07/11/16 0258  . ondansetron (ZOFRAN-ODT) disintegrating tablet 4 mg  4 mg Oral Q6H PRN Kerry HoughSpencer E Simon, PA-C      . traZODone (DESYREL) tablet 50 mg  50 mg Oral QHS,MR X 1 Kerry HoughSpencer E Simon, PA-C   50 mg at 07/11/16 2205   PTA Medications: Prescriptions Prior to Admission  Medication Sig Dispense Refill Last Dose  . clonazePAM (KLONOPIN) 0.5 MG tablet Take 0.5 mg by mouth 4 (four) times daily.   07/06/2016 at Unknown time  . escitalopram (LEXAPRO) 10 MG  tablet Take 10 mg by mouth daily.   07/06/2016 at Unknown time  . gabapentin (NEURONTIN) 800 MG tablet Take 800 mg by mouth 3 (three) times daily.   07/06/2016 at Unknown time  . ibuprofen (ADVIL,MOTRIN) 200 MG tablet Take 200 mg by mouth every 6 (six) hours as needed for moderate pain.   unknown  . traZODone (DESYREL) 100 MG tablet Take 100 mg by mouth at bedtime.   07/06/2016 at Unknown time    Patient Stressors: Financial difficulties Occupational concerns Substance abuse  Patient Strengths: Ability for insight Average or above average intelligence Capable of independent living [employer] Motivation for treatment/growth  Treatment Modalities: Medication Management, Group therapy, Case management,  1 to 1 session with clinician, Psychoeducation, Recreational therapy.   Physician Treatment Plan for Primary Diagnosis: Opioid dependence (HCC) Long Term Goal(s): Improvement in symptoms so as ready for discharge Improvement in symptoms so as ready for discharge   Short Term Goals: Ability to maintain clinical measurements within normal limits will improve Ability to identify changes in lifestyle to reduce recurrence of condition will improve Ability to identify triggers associated with substance abuse/mental health issues will improve  Medication Management: Evaluate patient's response, side  effects, and tolerance of medication regimen.  Therapeutic Interventions: 1 to 1 sessions, Unit Group sessions and Medication administration.  Evaluation of Outcomes: Adequate for Discharge per MD   RN Treatment Plan for Primary Diagnosis: Opioid dependence (HCC) Long Term Goal(s): Knowledge of disease and therapeutic regimen to maintain health will improve  Short Term Goals: Ability to remain free from injury will improve, Ability to disclose and discuss suicidal ideas, Ability to identify and develop effective coping behaviors will improve and Compliance with prescribed  medications will improve  Medication Management: RN will administer medications as ordered by provider, will assess and evaluate patient's response and provide education to patient for prescribed medication. RN will report any adverse and/or side effects to prescribing provider.  Therapeutic Interventions: 1 on 1 counseling sessions, Psychoeducation, Medication administration, Evaluate responses to treatment, Monitor vital signs and CBGs as ordered, Perform/monitor CIWA, COWS, AIMS and Fall Risk screenings as ordered, Perform wound care treatments as ordered.  Evaluation of Outcomes: Adequate for Discharge per MD   LCSW Treatment Plan for Primary Diagnosis: Opioid dependence (HCC) Long Term Goal(s): Safe transition to appropriate next level of care at discharge, Engage patient in therapeutic group addressing interpersonal concerns.  Short Term Goals: Engage patient in aftercare planning with referrals and resources, Increase social support, Increase emotional regulation, Identify triggers associated with mental health/substance abuse issues and Increase skills for wellness and recovery  Therapeutic Interventions: Assess for all discharge needs, 1 to 1 time with Social worker, Explore available resources and support systems, Assess for adequacy in community support network, Educate family and significant other(s) on suicide prevention, Complete Psychosocial Assessment, Interpersonal group therapy.  Evaluation of Outcomes: Adequate for Discharge per MD   Progress in Treatment :  Attending groups: Yes  Participating in groups: Yes  Taking medication as prescribed: Yes, MD continuing to assess for appropriate medication regimen  Toleration medication: Yes  Family/Significant other contact made: Treatment team assessing for appropriate contacts  Patient understands diagnosis: Yes  Discussing patient identified problems/goals with staff: Yes  Medical problems stabilized or resolved:  Yes  Denies suicidal/homicidal ideation: Yes, denies  Issues/concerns per patient self-inventory: None reported  Other: N/A  New problem(s) identified: None reported at this time    New Short Term/Long Term Goal(s): None at this time    Discharge Plan or Barriers: [facility]     Reason for Continuation of Hospitalization: Anxiety Depression Medication stabilization Suicidal Ideations Withdrawal symptoms  Estimated Length of Stay: Discharge anticipated for today 07/12/16    Attendees:  Patient:  Physician: Dr. Mckinley Jewelates, Dr. Jama Flavorsobos MD 12/4/20179:30am  Nursing: Marzetta Boardhrista Dopson, Leighton ParodyBritney Tyson, Quintella ReichertBeverly Knight, Elby Beckoni Waller, RN 12/4/20179:30am  RN Care Manager:   Social Workers: Chad CordialLauren Carter, LCSW, Belenda CruiseKristin Chrles Selley, LCSW, [facility] Smart, LCSW 12/4/20179:30am  Nurse Pratictioners: Gray BernhardtMay Augustin, NP, Hillery Jacksanika Lewis, NP 12/4/20179:30am  Other:   Scribe for Treatment Team: Samuella BruinKristin Kassy Mcenroe, LCSW Clinical Social Worker [facility] [phone]

## 2016-08-18 ENCOUNTER — Emergency Department (HOSPITAL_COMMUNITY)
Admission: EM | Admit: 2016-08-18 | Discharge: 2016-08-18 | Disposition: A | Discharge status: Home Health | Payer: No Typology Code available for payment source | Attending: Emergency Medicine | Admitting: Emergency Medicine

## 2016-08-18 ENCOUNTER — Emergency Department (HOSPITAL_COMMUNITY): Payer: No Typology Code available for payment source

## 2016-08-18 ENCOUNTER — Encounter (HOSPITAL_COMMUNITY): Payer: Self-pay | Admitting: *Deleted

## 2016-08-18 ENCOUNTER — Emergency Department (HOSPITAL_COMMUNITY)
Admission: EM | Admit: 2016-08-18 | Discharge: 2016-08-18 | Disposition: A | Discharge status: Home / Self Care | Payer: No Typology Code available for payment source | Source: Home / Self Care | Attending: Emergency Medicine | Admitting: Emergency Medicine

## 2016-08-18 ENCOUNTER — Encounter (HOSPITAL_COMMUNITY): Payer: Self-pay | Admitting: Emergency Medicine

## 2016-08-18 DIAGNOSIS — R4182 Altered mental status, unspecified: Secondary | ICD-10-CM | POA: Insufficient documentation

## 2016-08-18 DIAGNOSIS — T1490XA Injury, unspecified, initial encounter: Secondary | ICD-10-CM

## 2016-08-18 DIAGNOSIS — Y9389 Activity, other specified: Secondary | ICD-10-CM

## 2016-08-18 DIAGNOSIS — F17210 Nicotine dependence, cigarettes, uncomplicated: Secondary | ICD-10-CM | POA: Insufficient documentation

## 2016-08-18 DIAGNOSIS — Y92009 Unspecified place in unspecified non-institutional (private) residence as the place of occurrence of the external cause: Secondary | ICD-10-CM | POA: Insufficient documentation

## 2016-08-18 DIAGNOSIS — Y92410 Unspecified street and highway as the place of occurrence of the external cause: Secondary | ICD-10-CM

## 2016-08-18 DIAGNOSIS — Y999 Unspecified external cause status: Secondary | ICD-10-CM | POA: Insufficient documentation

## 2016-08-18 DIAGNOSIS — F1910 Other psychoactive substance abuse, uncomplicated: Secondary | ICD-10-CM

## 2016-08-18 DIAGNOSIS — S62515A Nondisplaced fracture of proximal phalanx of left thumb, initial encounter for closed fracture: Secondary | ICD-10-CM | POA: Insufficient documentation

## 2016-08-18 DIAGNOSIS — S0990XA Unspecified injury of head, initial encounter: Secondary | ICD-10-CM | POA: Diagnosis not present

## 2016-08-18 LAB — ETHANOL: Alcohol, Ethyl (B): <5 mg/dL (ref <5)

## 2016-08-18 LAB — CBG MONITORING, ED: GLUCOSE-CAPILLARY: 93 mg/dL (ref 65–99)

## 2016-08-18 MED ORDER — IBUPROFEN 800 MG PO TABS: 800.0000 mg | ORAL_TABLET | Freq: Three times a day (TID) | ORAL | 0 refills | Status: DC

## 2016-08-18 MED ORDER — NALOXONE HCL 2 MG/2ML IJ SOSY
1.0000 mg | PREFILLED_SYRINGE | Freq: Once | INTRAMUSCULAR | Status: AC
  Administered 2016-08-18: 1 mg via INTRAVENOUS
  Filled 2016-08-18: qty 2

## 2016-08-18 NOTE — ED Notes (Signed)
Pt is requesting to speak with Dr. Patria Maneampos at this time and hanging in the hall asking if anyone smokes.
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
## 2016-08-18 NOTE — ED Provider Notes (Signed)
Patient now reporting mild left thumb pain.  He does have mild swelling and tenderness of the left first metacarpal.  No obvious deformity.  Left thumb is perfused.  Normal left radial pulse.  Full range of motion of left wrist.  I offered the patient x-ray but he does not want the x-ray and refuses it.  He states he would like to leave and smoke a cigarette.  Discharge paperwork given.  Paper scrubs provided   Azalia BilisKevin Ziare Cryder, MD 08/18/16 0730

## 2016-08-18 NOTE — ED Triage Notes (Signed)
Pt says he took one Klonopin 0.5mg  at 1045.

## 2016-08-18 NOTE — ED Notes (Signed)
Officer at bedside.

## 2016-08-18 NOTE — ED Notes (Signed)
Pt left before receiving discharge paperwork and signing.

## 2016-08-18 NOTE — ED Notes (Signed)
Patient stated to ER tech that he wants to leave.  States that he has somewhere to be and needs to call his mother to come and pick him up.  Dr. Patria Maneampos aware.  Dr. Patria Maneampos states that he has already contacted his parents to come pick him up.
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
## 2016-08-18 NOTE — Discharge Instructions (Signed)
Ice, elevate Wear the splint until you follow up with the orthopedic doctor  Please obtain all of your results from medical records or have your doctors office obtain the results - share them with your doctor - you should be seen at your doctors office in the next 2 days. Call today to arrange your follow up. Take the medications as prescribed. Please review all of the medicines and only take them if you do not have an allergy to them. Please be aware that if you are taking birth control pills, taking other prescriptions, ESPECIALLY ANTIBIOTICS may make the birth control ineffective - if this is the case, either do not engage in sexual activity or use alternative methods of birth control such as condoms until you have finished the medicine and your family doctor says it is OK to restart them. If you are on a blood thinner such as COUMADIN, be aware that any other medicine that you take may cause the coumadin to either work too much, or not enough - you should have your coumadin level rechecked in next 7 days if this is the case.  ?  It is also a possibility that you have an allergic reaction to any of the medicines that you have been prescribed - Everybody reacts differently to medications and while MOST people have no trouble with most medicines, you may have a reaction such as nausea, vomiting, rash, swelling, shortness of breath. If this is the case, please stop taking the medicine immediately and contact your physician.  ?  You should return to the ER if you develop severe or worsening symptoms.

## 2017-07-15 IMAGING — DX DG HAND COMPLETE 3+V*L*
3 series · 3 of 3 positions shown · non-contrast
Comparison: None.

CLINICAL DATA: Left hand pain, predominantly along the from.

EXAM:
LEFT HAND - COMPLETE 3+ VIEW

[hand pa]
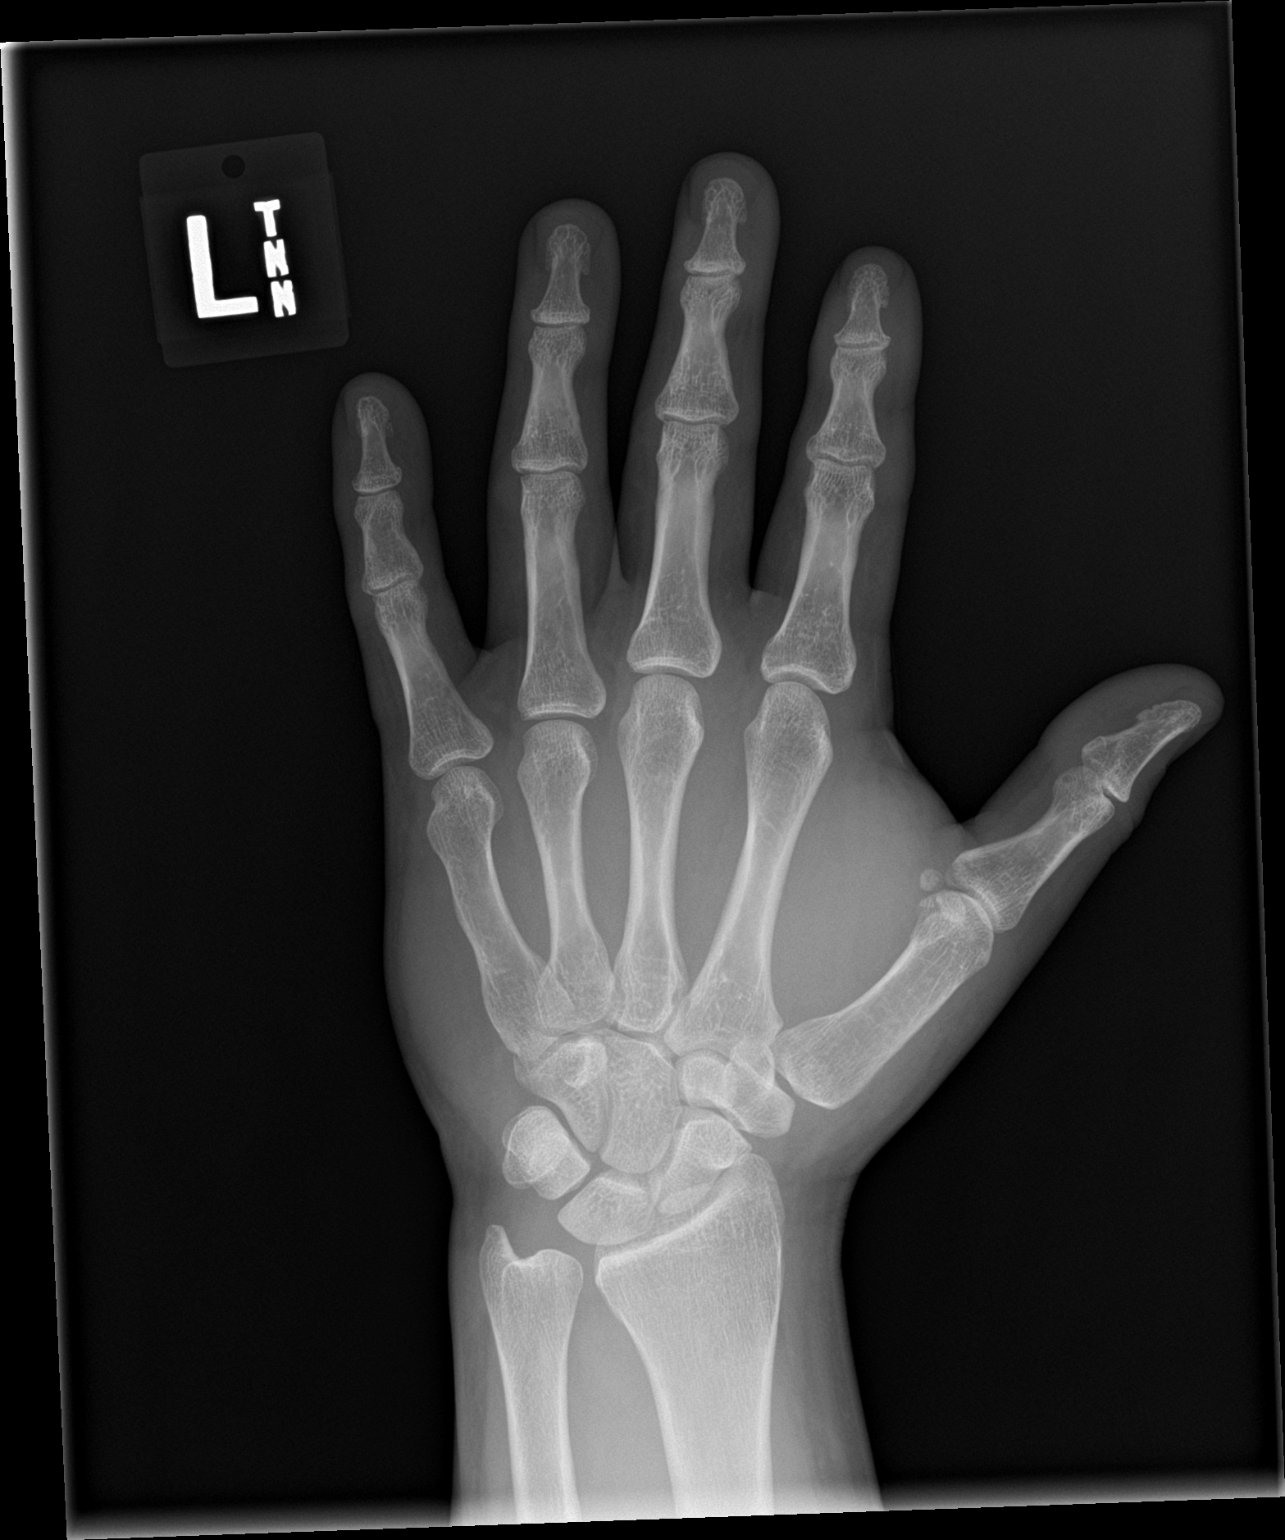

[hand obl]
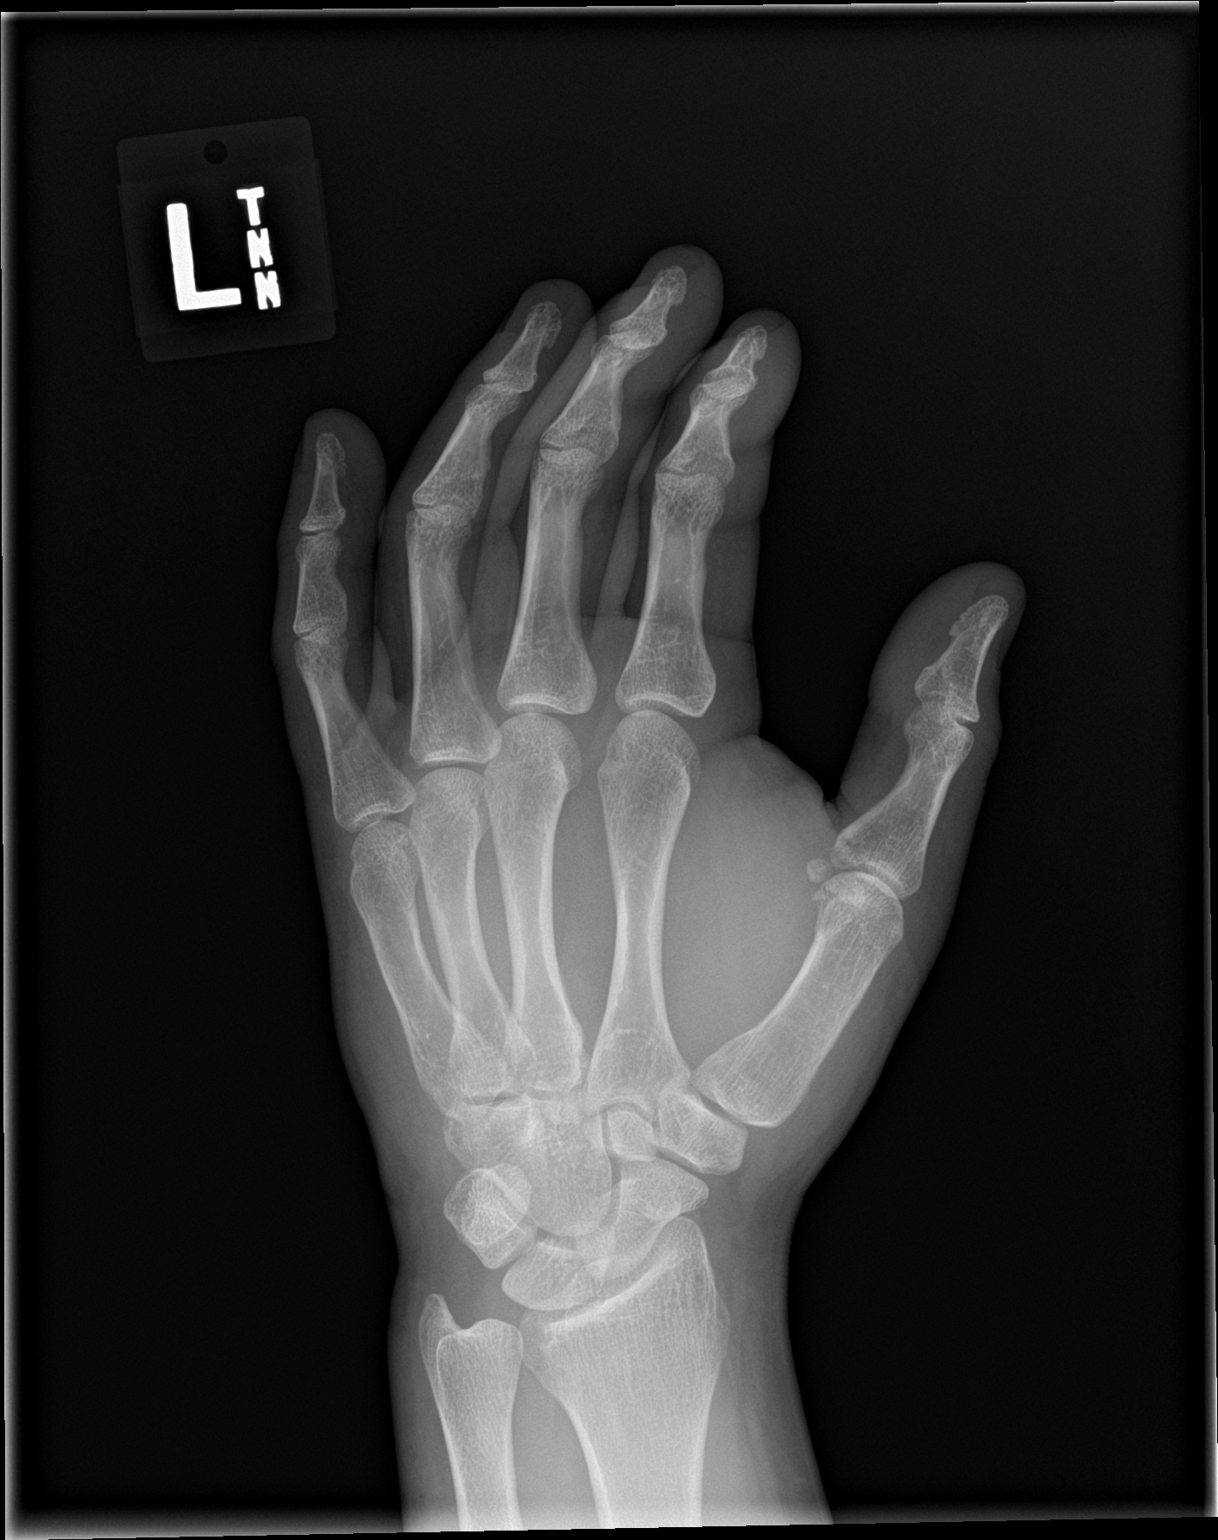

[hand lat]
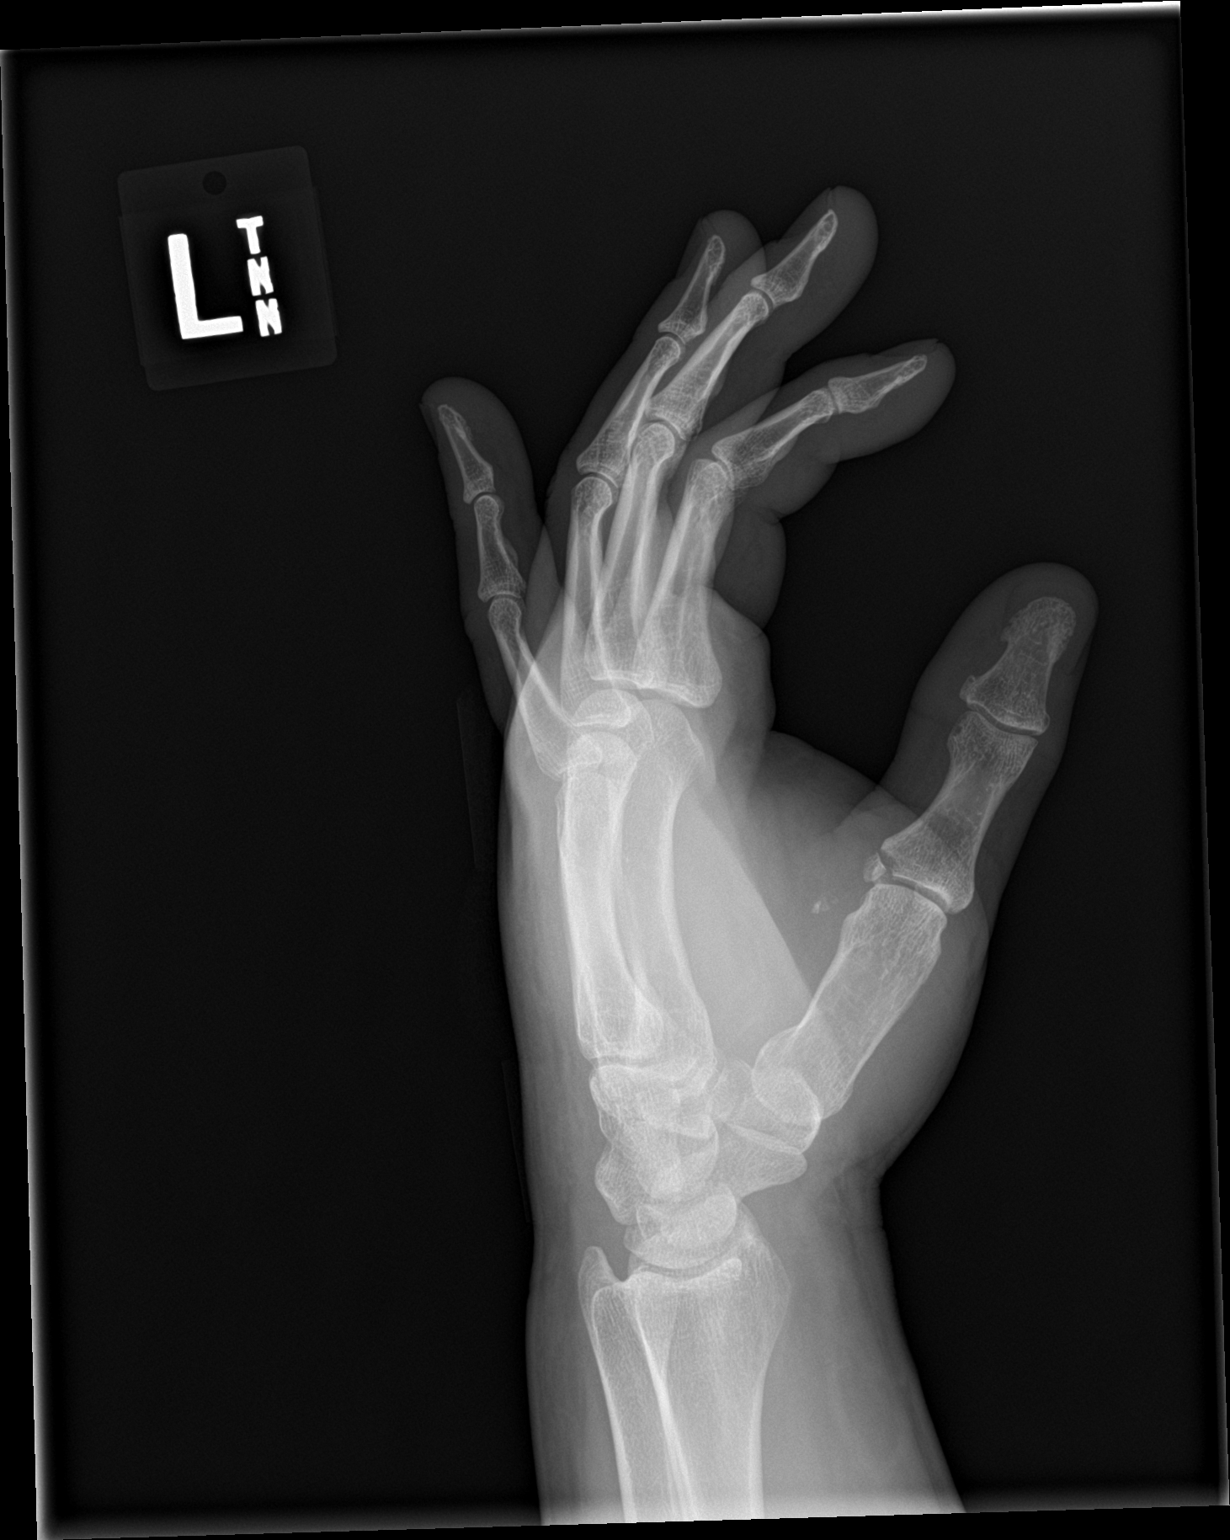

[3 of 3 positions shown; findings below may reference images not displayed]

FINDINGS: An avulsion fracture is present at the base of the proximal phalanx
in the thumb associated with the ulnar collateral ligament. There is
also an avulsion at the distal aspect of the first metacarpal. The
index finger is unremarkable. The remainder the hand is within
normal limits.
IMPRESSION: 1. Avulsion fracture involving the base of the proximal phalanx of
the thumb and the distal first metacarpal at the ulnar collateral
ligament insertion sites.
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
## 2019-05-24 ENCOUNTER — Other Ambulatory Visit: Payer: Self-pay

## 2019-05-24 DIAGNOSIS — Z20822 Contact with and (suspected) exposure to covid-19: Secondary | ICD-10-CM

## 2019-05-25 LAB — NOVEL CORONAVIRUS, NAA: SARS-CoV-2, NAA: NOT DETECTED

## 2019-08-20 ENCOUNTER — Ambulatory Visit: Payer: Self-pay | Attending: Internal Medicine

## 2019-08-20 DIAGNOSIS — Z20822 Contact with and (suspected) exposure to covid-19: Secondary | ICD-10-CM | POA: Insufficient documentation

## 2019-08-21 LAB — NOVEL CORONAVIRUS, NAA: SARS-CoV-2, NAA: NOT DETECTED

## 2019-09-26 ENCOUNTER — Ambulatory Visit: Payer: Self-pay

## 2019-10-15 ENCOUNTER — Encounter (HOSPITAL_COMMUNITY): Payer: Self-pay | Admitting: Emergency Medicine

## 2019-10-15 ENCOUNTER — Emergency Department (HOSPITAL_COMMUNITY)
Admission: EM | Admit: 2019-10-15 | Discharge: 2019-10-15 | Disposition: A | Discharge status: Home / Self Care | Payer: Self-pay | Attending: Emergency Medicine | Admitting: Emergency Medicine

## 2019-10-15 ENCOUNTER — Other Ambulatory Visit: Payer: Self-pay

## 2019-10-15 DIAGNOSIS — S61412A Laceration without foreign body of left hand, initial encounter: Secondary | ICD-10-CM | POA: Insufficient documentation

## 2019-10-15 DIAGNOSIS — Y999 Unspecified external cause status: Secondary | ICD-10-CM | POA: Insufficient documentation

## 2019-10-15 DIAGNOSIS — W010XXA Fall on same level from slipping, tripping and stumbling without subsequent striking against object, initial encounter: Secondary | ICD-10-CM | POA: Insufficient documentation

## 2019-10-15 DIAGNOSIS — F17210 Nicotine dependence, cigarettes, uncomplicated: Secondary | ICD-10-CM | POA: Insufficient documentation

## 2019-10-15 DIAGNOSIS — Z23 Encounter for immunization: Secondary | ICD-10-CM | POA: Insufficient documentation

## 2019-10-15 DIAGNOSIS — Y9389 Activity, other specified: Secondary | ICD-10-CM | POA: Insufficient documentation

## 2019-10-15 DIAGNOSIS — Y929 Unspecified place or not applicable: Secondary | ICD-10-CM | POA: Insufficient documentation

## 2019-10-15 DIAGNOSIS — Z79899 Other long term (current) drug therapy: Secondary | ICD-10-CM | POA: Insufficient documentation

## 2019-10-15 MED ORDER — BACITRACIN ZINC 500 UNIT/GM EX OINT
TOPICAL_OINTMENT | Freq: Once | CUTANEOUS | Status: AC
  Administered 2019-10-15: 1 via TOPICAL
  Filled 2019-10-15: qty 0.9

## 2019-10-15 MED ORDER — TETANUS-DIPHTH-ACELL PERTUSSIS 5-2.5-18.5 LF-MCG/0.5 IM SUSP
0.5000 mL | Freq: Once | INTRAMUSCULAR | Status: AC
  Administered 2019-10-15: 0.5 mL via INTRAMUSCULAR
  Filled 2019-10-15: qty 0.5

## 2019-10-15 MED ORDER — LIDOCAINE HCL (PF) 1 % IJ SOLN
5.0000 mL | Freq: Once | INTRAMUSCULAR | Status: AC
  Administered 2019-10-15: 5 mL
  Filled 2019-10-15: qty 6

## 2019-10-15 NOTE — Discharge Instructions (Addendum)
Keep wound clean and dry. Clean with Dove soap on a q-tip if needed. Change dressing regularly to keep wound clean and dry while at work.  Recommend wound check with your doctor in 2 days, suture removal in 10-12 days.

## 2019-10-15 NOTE — ED Provider Notes (Signed)
[facility] EMERGENCY DEPARTMENT Provider Note   CSN: [id number] Arrival date & time: 10/15/19  1306     History Chief Complaint  Patient presents with  . Laceration    Ryan Kelley is a 34 y.o. male.  34yo male with laceration to the palm of the left hand. 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 Bleeding is controlled. No other injuries. Last td unknown. Injury occurred today just prior to arrival in the ER.         Past Medical History:  Diagnosis Date  . Anxiety   . Attempted suicide ([place])   . Chronic back pain   . Chronic knee pain   . Depression   . Insomnia   . PTSD (post-traumatic stress disorder)   . Substance abuse [facility])     Patient Active Problem List   Diagnosis Date Noted  . Opioid withdrawal ([place]) 07/08/2016  . Opioid dependence ([place]) 10/18/2012  . Opioid use with withdrawal ([place]) 10/18/2012    History reviewed. No pertinent surgical history.     Family History  Problem Relation Age of Onset  . Diabetes Mother   . Cancer Mother   . Hypertension Mother   . Hypertension Father     Social History   Tobacco Use  . Smoking status: Current Every Day Smoker    Packs/day: 1.00    Years: 7.00    Pack years: 7.00    Types: Cigarettes  . Smokeless tobacco: Never Used  Substance Use Topics  . Alcohol use: Yes    Comment: occasionally   . Drug use: Yes    Frequency: 5.0 times per week    Types: Marijuana, Hydrocodone, Oxycodone, Benzodiazepines    Comment: marijuana last used 07/06/16, heroin last used 07/07/16    Home Medications Prior to Admission medications   Medication Sig Start Date End Date Taking? Authorizing Provider  Buprenorphine HCl-Naloxone HCl 8-2 MG FILM Place 1 Film under the tongue 3 (three) times daily. 10/05/19  Yes [provider]  buPROPion (WELLBUTRIN) 100 MG tablet Take 100 mg by mouth daily. 10/05/19  Yes [provider]  gabapentin (NEURONTIN) 800 MG tablet Take 800 mg by mouth 3 (three) times daily. 10/05/19  Yes [provider]  prochlorperazine (COMPAZINE) 10 MG tablet Take 10 mg by mouth every 6 (six) hours as needed. 08/16/19  Yes [provider]  SUMAtriptan (IMITREX) 50 MG tablet May repeat in 2 hours if headache persists or recurs.  Do not exceed more than 4 tablets in 24 hours Patient not taking: Reported on 11/25/2015 03/13/15 12/26/15  Evalee Jefferson, PA-C    Allergies    Poison ivy extract [poison ivy extract]  Review of Systems   Review of Systems  Constitutional: Negative for fever.  Musculoskeletal: Positive for myalgias. Negative for arthralgias and joint swelling.  Skin: Positive for wound. Negative for rash.  Allergic/Immunologic: Negative for immunocompromised state.  Neurological: Negative for weakness and numbness.  Hematological: Negative for adenopathy. Does not bruise/bleed easily.    Physical Exam Updated Vital Signs BP (!) 124/101 (BP Location: Right Arm)   Pulse (!) 107   Temp 98.4 F (36.9 C) (Oral)   Resp (!) 21   Ht 5\' 5"  (1.651 m)   Wt 95.3 kg   SpO2 99%   BMI 34.95 kg/m   Physical Exam Vitals and nursing note reviewed.  Constitutional:  General: He is not in acute distress.    Appearance: He is well-developed. He is not diaphoretic.  HENT:     Head: Normocephalic and atraumatic.  Cardiovascular:     Pulses: Normal pulses.  Pulmonary:     Effort: Pulmonary effort is normal.  Musculoskeletal:        General: Tenderness and signs of injury present. No swelling or deformity. Normal range of motion.     Left hand: Laceration present.       Arms:  Skin:    General: Skin is warm and dry.     Findings: No erythema or rash.  Neurological:     Mental Status: He is alert and oriented to person, place, and time.     Sensory: No sensory deficit.  Psychiatric:        Behavior: Behavior normal.     ED Results / Procedures /  Treatments   Labs (all labs ordered are listed, but only abnormal results are displayed) Labs Reviewed - No data to display  EKG None  Radiology No results found.  Procedures .Marland KitchenLaceration Repair  Date/Time: 10/15/2019 2:50 PM Performed by: Jeannie Fend, PA-C Authorized by: Jeannie Fend, PA-C   Consent:    Consent obtained:  Verbal   Consent given by:  Patient   Risks discussed:  Infection, need for additional repair, pain, poor cosmetic result and poor wound healing   Alternatives discussed:  No treatment and delayed treatment Universal protocol:    Procedure explained and questions answered to patient or proxy's satisfaction: yes     Relevant documents present and verified: yes     Test results available and properly labeled: yes     Imaging studies available: yes     Required blood products, implants, devices, and special equipment available: yes     Site/side marked: yes     Immediately prior to procedure, a time out was called: yes     Patient identity confirmed:  Verbally with patient Anesthesia (see MAR for exact dosages):    Anesthesia method:  Local infiltration   Local anesthetic:  Lidocaine 1% w/o epi Laceration details:    Location:  Hand   Hand location:  L palm   Length (cm):  3.5   Depth (mm):  3 Repair type:    Repair type:  Simple Pre-procedure details:    Preparation:  Patient was prepped and draped in usual sterile fashion Exploration:    Hemostasis achieved with:  Direct pressure   Wound exploration: wound explored through full range of motion and entire depth of wound probed and visualized     Wound extent: no fascia violation noted, no foreign bodies/material noted and no muscle damage noted     Contaminated: no   Treatment:    Area cleansed with:  Betadine   Amount of cleaning:  Extensive   Irrigation solution:  Sterile saline Skin repair:    Repair method:  Sutures   Suture size:  3-0   Suture material:  Nylon   Suture technique:   Simple interrupted   Number of sutures:  7 Approximation:    Approximation:  Close Post-procedure details:    Dressing:  Antibiotic ointment and bulky dressing   Patient tolerance of procedure:  Tolerated well, no immediate complications   (including critical care time)  Medications Ordered in ED Medications  lidocaine (PF) (XYLOCAINE) 1 % injection 5 mL (has no administration in time range)  bacitracin ointment (has no administration in time range)  Tdap (BOOSTRIX) injection 0.5 mL (0.5 mLs Intramuscular Given 10/15/19 1426)    ED Course  I have reviewed the triage vital signs and the nursing notes.  Pertinent labs & imaging results that were available during my care of the patient were reviewed by me and considered in my medical decision making (see chart for details).  Clinical Course as of Oct 14 1452  Mon Oct 15, 2019  5855 34 year old right-hand-dominant male presents with laceration to the palm of the left hand which occurred just prior to arrival in the emergency room when he cut it on a rusty farm full.  Hand was soaked in diluted Betadine for approximately 20 minutes.  Bleeding is controlled, wound extends to muscle but does not involve muscle, no limits in range of motion, cap refill normal, sensation intact.  Wound was anesthetized, irrigated, closed with 7 simple interrupted sutures, recommend wound check with his PCP in 2 days, suture removal in 10 to 12 days.  Tetanus updated.  All questions answered.   [LM]    Clinical Course User Index [LM] Alden Hipp   MDM Rules/Calculators/A&P                      Final Clinical Impression(s) / ED Diagnoses Final diagnoses:  Laceration of left hand without foreign body, initial encounter    Rx / DC Orders ED Discharge Orders    None       Jeannie Fend, PA-C 10/15/19 1454    Linwood Dibbles, MD 10/15/19 1538

## 2019-10-15 NOTE — ED Triage Notes (Signed)
Pt cut his left thumb on a trailer an hour ago.

## 2019-10-27 ENCOUNTER — Encounter (HOSPITAL_COMMUNITY): Payer: Self-pay | Admitting: Emergency Medicine

## 2019-10-27 ENCOUNTER — Emergency Department (HOSPITAL_COMMUNITY)
Admission: EM | Admit: 2019-10-27 | Discharge: 2019-10-27 | Disposition: A | Discharge status: Home / Self Care | Payer: Self-pay | Attending: Emergency Medicine | Admitting: Emergency Medicine

## 2019-10-27 ENCOUNTER — Other Ambulatory Visit: Payer: Self-pay

## 2019-10-27 DIAGNOSIS — Z4802 Encounter for removal of sutures: Secondary | ICD-10-CM | POA: Insufficient documentation

## 2019-10-27 DIAGNOSIS — Z79899 Other long term (current) drug therapy: Secondary | ICD-10-CM | POA: Insufficient documentation

## 2019-10-27 DIAGNOSIS — F17210 Nicotine dependence, cigarettes, uncomplicated: Secondary | ICD-10-CM | POA: Insufficient documentation

## 2019-10-27 NOTE — ED Provider Notes (Signed)
[facility] EMERGENCY DEPARTMENT Provider Note   CSN: [id number] Arrival date & time: 10/27/19  1106     History Chief Complaint  Patient presents with  . Suture / Staple Removal    Ryan Kelley is a 34 y.o. male.  HPI      Ryan Kelley is a 34 y.o. male who presents to the Emergency Department requesting suture removal.  He was seen here on 10/15/19 and treated for a laceration to base of the left thumb.  He reports the wound is healing without difficulty.  He denies numbness, swelling, or drainage.  No redness of the wound.     Past Medical History:  Diagnosis Date  . Anxiety   . Attempted suicide ([place])   . Chronic back pain   . Chronic knee pain   . Depression   . Insomnia   . PTSD (post-traumatic stress disorder)   . Substance abuse [facility])     Patient Active Problem List   Diagnosis Date Noted  . Opioid withdrawal ([place]) 07/08/2016  . Opioid dependence ([place]) 10/18/2012  . Opioid use with withdrawal ([place]) 10/18/2012    History reviewed. No pertinent surgical history.     Family History  Problem Relation Age of Onset  . Diabetes Mother   . Cancer Mother   . Hypertension Mother   . Hypertension Father     Social History   Tobacco Use  . Smoking status: Current Every Day Smoker    Packs/day: 1.00    Years: 7.00    Pack years: 7.00    Types: Cigarettes  . Smokeless tobacco: Never Used  Substance Use Topics  . Alcohol use: Yes    Comment: occasionally   . Drug use: Yes    Frequency: 5.0 times per week    Types: Marijuana, Hydrocodone, Oxycodone, Benzodiazepines    Comment: marijuana last used 07/06/16, heroin last used 07/07/16    Home Medications Prior to Admission medications   Medication Sig Start Date End Date Taking? Authorizing Provider  Buprenorphine HCl-Naloxone HCl 8-2 MG FILM Place 1 Film under the tongue 3 (three) times daily. 10/05/19   [provider]  buPROPion (WELLBUTRIN) 100 MG tablet Take 100 mg by mouth daily. 10/05/19    [provider]  gabapentin (NEURONTIN) 800 MG tablet Take 800 mg by mouth 3 (three) times daily. 10/05/19   [provider]  prochlorperazine (COMPAZINE) 10 MG tablet Take 10 mg by mouth every 6 (six) hours as needed. 08/16/19   [provider]  SUMAtriptan (IMITREX) 50 MG tablet May repeat in 2 hours if headache persists or recurs.  Do not exceed more than 4 tablets in 24 hours Patient not taking: Reported on 11/25/2015 03/13/15 12/26/15  Evalee Jefferson, PA-C    Allergies    Poison ivy extract [poison ivy extract]  Review of Systems   Review of Systems  Constitutional: Negative for chills and fever.  Musculoskeletal: Negative for arthralgias, back pain and joint swelling.  Skin: Positive for wound. Negative for color change.       Laceration left thumb with sutures in place.  Neurological: Negative for dizziness, weakness and numbness.  Hematological: Does not bruise/bleed easily.    Physical Exam Updated Vital Signs BP 126/67 (BP Location: Right Arm)   Pulse 81   Temp 98.4 F (36.9 C) (Oral)   Resp 16   Ht 5\' 6"  (1.676 m)   Wt 95.3 kg   SpO2 100%   BMI 33.91 kg/m  Physical Exam Vitals and nursing note reviewed.  Constitutional:      General: He is not in acute distress.    Appearance: Normal appearance. He is well-developed.  Cardiovascular:     Rate and Rhythm: Normal rate and regular rhythm.     Heart sounds: Normal heart sounds. No murmur.  Pulmonary:     Effort: Pulmonary effort is normal. No respiratory distress.     Breath sounds: Normal breath sounds.  Musculoskeletal:        General: No tenderness.     Left hand: No bony tenderness. Normal range of motion. Normal strength. Normal capillary refill.     Comments: Laceration to base of left thumb with 7 sutures in place. No erythema, drainage or edema.  No wound dishensence  Skin:    General: Skin is warm.     Capillary Refill: Capillary refill takes less than 2 seconds.     Findings: No  erythema.  Neurological:     General: No focal deficit present.     Mental Status: He is alert.     Motor: No weakness or abnormal muscle tone.     ED Results / Procedures / Treatments   Labs (all labs ordered are listed, but only abnormal results are displayed) Labs Reviewed - No data to display  EKG None  Radiology No results found.  Procedures Procedures (including critical care time)  Medications Ordered in ED Medications - No data to display  ED Course  I have reviewed the triage vital signs and the nursing notes.  Pertinent labs & imaging results that were available during my care of the patient were reviewed by me and considered in my medical decision making (see chart for details).    MDM Rules/Calculators/A&P                      Pt here for suture removal.  NV intact.  Wound appearing to be healing well.  No dishensce.    SUTURE REMOVAL Performed by: nursing staff  Consent: Verbal consent obtained. Patient identity confirmed: provided demographic data Time out: Immediately prior to procedure a "time out" was called to verify the correct patient, procedure, equipment, support staff and site/side marked as required.  Location details: left thumb  Wound Appearance: clean  Sutures/Staples Removed: 7  Facility: sutures placed in this facility Patient tolerance: Patient tolerated the procedure well with no immediate complications.    Final Clinical Impression(s) / ED Diagnoses Final diagnoses:  Visit for suture removal    Rx / DC Orders ED Discharge Orders    None       Pauline Aus, PA-C 10/27/19 2035    Vanetta Mulders, MD 10/29/19 [phone]

## 2019-10-27 NOTE — ED Triage Notes (Signed)
Patient presents for suture removal left thumb.

## 2019-10-27 NOTE — Discharge Instructions (Addendum)
Return if needed

## 2019-12-20 ENCOUNTER — Other Ambulatory Visit: Payer: Self-pay

## 2019-12-20 ENCOUNTER — Emergency Department (HOSPITAL_COMMUNITY): Payer: Self-pay

## 2019-12-20 ENCOUNTER — Encounter (HOSPITAL_COMMUNITY): Payer: Self-pay | Admitting: Emergency Medicine

## 2019-12-20 ENCOUNTER — Emergency Department (HOSPITAL_COMMUNITY)
Admission: EM | Admit: 2019-12-20 | Discharge: 2019-12-21 | Disposition: A | Discharge status: Home / Self Care | Payer: Self-pay | Attending: Emergency Medicine | Admitting: Emergency Medicine

## 2019-12-20 DIAGNOSIS — Y9389 Activity, other specified: Secondary | ICD-10-CM | POA: Insufficient documentation

## 2019-12-20 DIAGNOSIS — M25511 Pain in right shoulder: Secondary | ICD-10-CM | POA: Insufficient documentation

## 2019-12-20 DIAGNOSIS — T401X1A Poisoning by heroin, accidental (unintentional), initial encounter: Secondary | ICD-10-CM | POA: Insufficient documentation

## 2019-12-20 LAB — CBC WITH DIFFERENTIAL/PLATELET
Abs Immature Granulocytes: 0.09 10*3/uL — ABNORMAL HIGH (ref 0.00–0.07)
Basophils Absolute: 0.1 10*3/uL (ref 0.0–0.1)
Basophils Relative: 1 %
Eosinophils Absolute: 0.3 10*3/uL (ref 0.0–0.5)
Eosinophils Relative: 2 %
HCT: 47.1 % (ref 39.0–52.0)
Hemoglobin: 15.6 g/dL (ref 13.0–17.0)
Immature Granulocytes: 1 %
Lymphocytes Relative: 17 %
Lymphs Abs: 2.5 10*3/uL (ref 0.7–4.0)
MCH: 31.1 pg (ref 26.0–34.0)
MCHC: 33.1 g/dL (ref 30.0–36.0)
MCV: 93.8 fL (ref 80.0–100.0)
Monocytes Absolute: 0.7 10*3/uL (ref 0.1–1.0)
Monocytes Relative: 5 %
Neutro Abs: 10.7 10*3/uL — ABNORMAL HIGH (ref 1.7–7.7)
Neutrophils Relative %: 74 %
Platelets: 240 10*3/uL (ref 150–400)
RBC: 5.02 MIL/uL (ref 4.22–5.81)
RDW: 13 % (ref 11.5–15.5)
WBC: 14.4 10*3/uL — ABNORMAL HIGH (ref 4.0–10.5)
nRBC: 0 % (ref 0.0–0.2)

## 2019-12-20 LAB — ETHANOL: Alcohol, Ethyl (B): <10 mg/dL (ref <10)

## 2019-12-20 LAB — SAMPLE TO BLOOD BANK
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
## 2019-12-20 NOTE — Progress Notes (Signed)
Orthopedic Tech Progress Note Patient Details:  Ryan Kelley 08/09/1877 [id number]  Patient ID: Ryan Kelley, male   DOB: 08/09/1877, 34 y.o.   MRN: [id number]   [name] 12/20/2019, 10:21 PM

## 2019-12-20 NOTE — Progress Notes (Signed)
RT to bedside for Trauma activation. Airway intact, SpO2 98% RA, HR 104. RT will continue to monitor.

## 2019-12-21 ENCOUNTER — Other Ambulatory Visit: Payer: Self-pay

## 2019-12-21 ENCOUNTER — Encounter (HOSPITAL_COMMUNITY): Payer: Self-pay

## 2019-12-21 ENCOUNTER — Emergency Department (HOSPITAL_COMMUNITY)
Admission: EM | Admit: 2019-12-21 | Discharge: 2019-12-26 | Disposition: A | Discharge status: Home / Self Care | Payer: Self-pay | Attending: Emergency Medicine | Admitting: Emergency Medicine

## 2019-12-21 ENCOUNTER — Encounter (HOSPITAL_COMMUNITY): Payer: Self-pay | Admitting: Emergency Medicine

## 2019-12-21 DIAGNOSIS — R45851 Suicidal ideations: Secondary | ICD-10-CM | POA: Insufficient documentation

## 2019-12-21 DIAGNOSIS — F332 Major depressive disorder, recurrent severe without psychotic features: Secondary | ICD-10-CM | POA: Insufficient documentation

## 2019-12-21 DIAGNOSIS — F1320 Sedative, hypnotic or anxiolytic dependence, uncomplicated: Secondary | ICD-10-CM | POA: Insufficient documentation

## 2019-12-21 DIAGNOSIS — F1120 Opioid dependence, uncomplicated: Secondary | ICD-10-CM | POA: Insufficient documentation

## 2019-12-21 DIAGNOSIS — T401X4A Poisoning by heroin, undetermined, initial encounter: Secondary | ICD-10-CM | POA: Insufficient documentation

## 2019-12-21 LAB — COMPREHENSIVE METABOLIC PANEL
ALT: 33 U/L (ref 0–44)
ALT: 34 U/L (ref 0–44)
AST: 31 U/L (ref 15–41)
AST: 34 U/L (ref 15–41)
Albumin: 4.3 g/dL (ref 3.5–5.0)
Albumin: 4.6 g/dL (ref 3.5–5.0)
Alkaline Phosphatase: 79 U/L (ref 38–126)
Alkaline Phosphatase: 92 U/L (ref 38–126)
Anion gap: 10 (ref 5–15)
Anion gap: 8 (ref 5–15)
BUN: 12 mg/dL (ref 6–20)
BUN: 19 mg/dL (ref 6–20)
CO2: 27 mmol/L (ref 22–32)
CO2: 29 mmol/L (ref 22–32)
Calcium: 8.7 mg/dL — ABNORMAL LOW (ref 8.9–10.3)
Calcium: 8.7 mg/dL — ABNORMAL LOW (ref 8.9–10.3)
Chloride: 105 mmol/L (ref 98–111)
Chloride: 102 mmol/L (ref 98–111)
Creatinine, Ser: 1.24 mg/dL (ref 0.61–1.24)
Creatinine, Ser: 0.82 mg/dL (ref 0.61–1.24)
GFR calc Af Amer: >60 mL/min (ref >60)
GFR calc Af Amer: >60 mL/min (ref >60)
GFR calc non Af Amer: >60 mL/min (ref >60)
GFR calc non Af Amer: >60 mL/min (ref >60)
Glucose, Bld: 168 mg/dL — ABNORMAL HIGH (ref 70–99)
Glucose, Bld: 111 mg/dL — ABNORMAL HIGH (ref 70–99)
Potassium: 4.4 mmol/L (ref 3.5–5.1)
Potassium: 3.9 mmol/L (ref 3.5–5.1)
Sodium: 142 mmol/L (ref 135–145)
Sodium: 139 mmol/L (ref 135–145)
Total Bilirubin: 0.8 mg/dL (ref 0.3–1.2)
Total Bilirubin: 0.6 mg/dL (ref 0.3–1.2)
Total Protein: 6.5 g/dL (ref 6.5–8.1)
Total Protein: 7.3 g/dL (ref 6.5–8.1)

## 2019-12-21 LAB — CBC WITH DIFFERENTIAL/PLATELET
Abs Immature Granulocytes: 0.13 10*3/uL — ABNORMAL HIGH (ref 0.00–0.07)
Basophils Absolute: 0.1 10*3/uL (ref 0.0–0.1)
Basophils Relative: 1 %
Eosinophils Absolute: 0.3 10*3/uL (ref 0.0–0.5)
Eosinophils Relative: 2 %
HCT: 45.4 % (ref 39.0–52.0)
Hemoglobin: 14.9 g/dL (ref 13.0–17.0)
Immature Granulocytes: 1 %
Lymphocytes Relative: 22 %
Lymphs Abs: 3.5 10*3/uL (ref 0.7–4.0)
MCH: 31.3 pg (ref 26.0–34.0)
MCHC: 32.8 g/dL (ref 30.0–36.0)
MCV: 95.4 fL (ref 80.0–100.0)
Monocytes Absolute: 1 10*3/uL (ref 0.1–1.0)
Monocytes Relative: 7 %
Neutro Abs: 11 10*3/uL — ABNORMAL HIGH (ref 1.7–7.7)
Neutrophils Relative %: 67 %
Platelets: 298 10*3/uL (ref 150–400)
RBC: 4.76 MIL/uL (ref 4.22–5.81)
RDW: 12.9 % (ref 11.5–15.5)
WBC: 16.1 10*3/uL — ABNORMAL HIGH (ref 4.0–10.5)
nRBC: 0 % (ref 0.0–0.2)

## 2019-12-21 LAB — RAPID URINE DRUG SCREEN, HOSP PERFORMED
Amphetamines: NOT DETECTED
Barbiturates: NOT DETECTED
Benzodiazepines: POSITIVE — AB
Cocaine: POSITIVE — AB
Opiates: NOT DETECTED
Tetrahydrocannabinol: POSITIVE — AB

## 2019-12-21 LAB — ETHANOL: Alcohol, Ethyl (B): <10 mg/dL (ref <10)

## 2019-12-21 LAB — SALICYLATE LEVEL: Salicylate Lvl: <7 mg/dL — ABNORMAL LOW (ref 7.0–30.0)

## 2019-12-21 LAB — ACETAMINOPHEN LEVEL: Acetaminophen (Tylenol), Serum: <10 ug/mL — ABNORMAL LOW (ref 10–30)

## 2019-12-21 MED ORDER — NICOTINE 21 MG/24HR TD PT24
21.0000 mg | MEDICATED_PATCH | Freq: Every day | TRANSDERMAL | Status: DC
  Administered 2019-12-21 – 2019-12-24 (×4): 21 mg via TRANSDERMAL
  Filled 2019-12-21 (×4): qty 1

## 2019-12-21 MED ORDER — CLONIDINE HCL 0.1 MG PO TABS
0.1000 mg | ORAL_TABLET | Freq: Four times a day (QID) | ORAL | Status: AC
  Administered 2019-12-21 – 2019-12-23 (×8): 0.1 mg via ORAL
  Filled 2019-12-21 (×8): qty 1

## 2019-12-21 MED ORDER — IBUPROFEN 400 MG PO TABS
600.0000 mg | ORAL_TABLET | Freq: Three times a day (TID) | ORAL | Status: DC | PRN
  Administered 2019-12-21 – 2019-12-26 (×7): 600 mg via ORAL
  Filled 2019-12-21 (×7): qty 2

## 2019-12-21 MED ORDER — METHOCARBAMOL 500 MG PO TABS
500.0000 mg | ORAL_TABLET | Freq: Three times a day (TID) | ORAL | Status: DC | PRN
  Administered 2019-12-22 – 2019-12-26 (×8): 500 mg via ORAL
  Filled 2019-12-21 (×8): qty 1

## 2019-12-21 MED ORDER — ALUM & MAG HYDROXIDE-SIMETH 200-200-20 MG/5ML PO SUSP: 30.0000 mL | Freq: Four times a day (QID) | ORAL | Status: DC | PRN

## 2019-12-21 MED ORDER — CLONIDINE HCL 0.1 MG PO TABS
0.1000 mg | ORAL_TABLET | Freq: Two times a day (BID) | ORAL | Status: AC
  Administered 2019-12-24 – 2019-12-25 (×4): 0.1 mg via ORAL
  Filled 2019-12-21 (×4): qty 1

## 2019-12-21 MED ORDER — NALOXONE HCL 2 MG/2ML IJ SOSY
PREFILLED_SYRINGE | INTRAMUSCULAR | Status: AC | PRN
  Administered 2019-12-21: 2 mg via INTRAVENOUS

## 2019-12-21 MED ORDER — ONDANSETRON HCL 4 MG PO TABS
4.0000 mg | ORAL_TABLET | Freq: Three times a day (TID) | ORAL | Status: DC | PRN
  Administered 2019-12-24: 4 mg via ORAL
  Filled 2019-12-21 (×2): qty 1

## 2019-12-21 MED ORDER — NALOXONE HCL 2 MG/2ML IJ SOSY
PREFILLED_SYRINGE | INTRAMUSCULAR | Status: AC
  Administered 2019-12-21: 2 mg/mL via INTRAVENOUS
  Filled 2019-12-21: qty 2

## 2019-12-21 MED ORDER — HYDROXYZINE HCL 25 MG PO TABS
25.0000 mg | ORAL_TABLET | Freq: Four times a day (QID) | ORAL | Status: DC | PRN
  Administered 2019-12-24 – 2019-12-26 (×7): 25 mg via ORAL
  Filled 2019-12-21 (×7): qty 1

## 2019-12-21 MED ORDER — CLONIDINE HCL 0.1 MG PO TABS
0.1000 mg | ORAL_TABLET | Freq: Every day | ORAL | Status: DC
  Administered 2019-12-26: 0.1 mg via ORAL
  Filled 2019-12-21: qty 1

## 2019-12-21 MED ORDER — DICYCLOMINE HCL 10 MG PO CAPS: 20.0000 mg | ORAL_CAPSULE | Freq: Four times a day (QID) | ORAL | Status: DC | PRN

## 2019-12-21 MED ORDER — LOPERAMIDE HCL 2 MG PO CAPS: 2.0000 mg | ORAL_CAPSULE | ORAL | Status: DC | PRN

## 2019-12-21 MED ORDER — SODIUM CHLORIDE 0.9 % IV BOLUS
500.0000 mL | Freq: Once | INTRAVENOUS | Status: AC
  Administered 2019-12-21: 500 mL via INTRAVENOUS
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
## 2019-12-21 NOTE — ED Notes (Signed)
Pt medically cleared   TTS'd   Call from TTS recommend inpt

## 2019-12-21 NOTE — Progress Notes (Signed)
Orthopedic Tech Progress Note Patient Details:  Ryan Kelley Apr 05, 1986 [id number] While entering the patients room he walked out and left the building  Patient ID: [name], male   DOB: [birth year], 34 y.o.   MRN: [id number]   Smitty Pluck 12/21/2019, 6:07 AM
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
## 2019-12-21 NOTE — ED Notes (Signed)
Pt states he wants both his mom and fiance given any information that they request. Verbal Statement witnessed by sitter.

## 2019-12-21 NOTE — Discharge Instructions (Addendum)
Refrain from using illicit drugs. You can follow-up with Dr. Dion Saucier if you keep having issues with your shoulder.  Can wear sling for comfort. Return here for any new/acute changes.
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
## 2019-12-21 NOTE — ED Notes (Signed)
Mom took earring and watch home with her..clothes and shoes are in pt  locker

## 2019-12-21 NOTE — Code Documentation (Signed)
Pt awake, eyes, open, breathing.  Nasal airway in place.

## 2019-12-21 NOTE — BHH Counselor (Signed)
Disposition: Javier Glazier, PMHNP recommends in patient treatment. TTS to seek placement. [facility] notified of disposition.

## 2019-12-21 NOTE — ED Notes (Signed)
Patient walked out of ED without his discharge paperwork, without his shoulder immobilizer and sling.  Patient states that we didn't feed him and he was not being helped. Patient walked out the door.
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
## 2019-12-21 NOTE — Consult Note (Signed)
Pt reports that he also is abusing benzo   Reports he was first prescribed them, then lost his insurance   Now buys them off of the street

## 2019-12-21 NOTE — ED Notes (Signed)
Pt in unresponsive  Sats- 67 RA Nasal airway, Pt bagged while Dr L did jaw thrust due to short neck  Sats top 92 bagged   Narcan 2mg  return to spontaneous breathing and pt alert   Pt amits using 0.5mg  if heroin nasally   He reports using at least a gram daily Has used for the last year  Reports no previous in nor out pt treatment  also admits to marijuana usage Cigarette usage  Denies meth or ETOH beverage   Per mother: pt has threatened suicide by shooting himself  And she is concerned that he will self harm   after speaking to mother pt is requested  5

## 2019-12-21 NOTE — ED Notes (Signed)
Pt unresponsive, pulse present, no breathing.  Lips blue.  EDP at bedside.  Pt being bagged

## 2019-12-21 NOTE — ED Notes (Signed)
Pt stated he "hadn't received any help and no one was being nice to me, so I am leaving." IV was removed.

## 2019-12-22 NOTE — ED Notes (Signed)
Pt talking on the phone with his mom.

## 2019-12-22 NOTE — ED Notes (Signed)
Pt.s breakfast tray arrived. Pt sat up and eating breakfast as well as watching tv.

## 2019-12-22 NOTE — ED Notes (Signed)
Pt.s dinner arrived, pt sat up and eating dinner now.

## 2019-12-22 NOTE — Progress Notes (Signed)
Patient meets criteria for inpatient treatment. No appropriate or available beds at [facility]. CSW faxed referrals to the following facilities for review:  [facility]   TTS will continue to seek bed placement.  Vilma Meckel. Algis Greenhouse, MSW, LCSW Clinical Social Work/Disposition Phone: [phone] Fax: [phone]

## 2019-12-22 NOTE — ED Notes (Signed)
Pt's mother and girlfriend called, informed pt and he is given phone to call

## 2019-12-22 NOTE — ED Notes (Signed)
Pt requesting some for shoulder pain; meds given and pt given breakfast tray

## 2019-12-22 NOTE — ED Notes (Signed)
Pt mentioned about pain on their shoulder. RN notified. Told pt that his RN is a little preoccupied but that she will bring him something for his shoulder. Pt stated it was okay.

## 2019-12-22 NOTE — ED Notes (Signed)
Pt asked to take a shower. Brought things to pt. Was told to pt that he will need to wait for shift change in order to take his shower. Pt compliant with this.

## 2019-12-23 NOTE — ED Notes (Signed)
Pt on ph with Mother, asking about visitation guidelines  List of [facility] guidelines given to pt and explained  Ph call with Mother for 5 mins

## 2019-12-23 NOTE — ED Notes (Addendum)
Per sitter, Pt had 1 phone call for 4 mins to aunt  Followed by 1 ph call for 1 min to fiance'

## 2019-12-23 NOTE — ED Notes (Signed)
Visitor in room at this time.  

## 2019-12-23 NOTE — ED Notes (Signed)
Visitor off unit at this time

## 2019-12-23 NOTE — ED Notes (Signed)
Monitor in room for TTS re-eval per pt request

## 2019-12-23 NOTE — ED Notes (Signed)
TTS concluded

## 2019-12-23 NOTE — ED Notes (Signed)
Called to room by sitter who reports pt would like to know how long he's going to be IVC'ed and is c/o shoulder pain, entered room, pt lying in bed and asks how long his IVC papers are good, advised pt he will remain IVC'ed as long as there is a threat for self-harm, pt states he does not want to kill himself and if he did, he could use the call bell, his sheet or his pants to do so, or bang his head against the bedside table; pt asks for meds for shoulder pain  Call bell placed under secured door and ensured it is lower all the way, bedside tray removed from room  Discussed conversation with EDP and call placed to [facility] for re-evaluation per pt request  EDP spoke with pt and advised he will be re-evaluated by [facility]   ibuprofen given for shoulder pain

## 2019-12-23 NOTE — BH Assessment (Signed)
Recommend continued inpatient placement. Re-faxed referrals to the following hospitals:  [facility] Details       [facility]       [facility] Details       [facility] Details       [facility] Details       [facility] Details       [facility] Details       [facility] Details       [facility] Details       [facility] Details       [facility] Details       [facility] Details       [facility] Details       [facility] Details       [facility] Details       [facility] Details       [facility]

## 2019-12-23 NOTE — BHH Counselor (Signed)
Pt was reassessed today.  He remains in the ED following an overdose on heroin, which was describe din assessment as apparent suicide attempt.  During reassessment, Pt stated that he is not suicidal and that the overdose was not a suicide attempt.  Contacted Pt's fiancee Courtney for collateral.  She stated that she was unsure whether Pt is suicidal or not.  When asked if she could watch Pt to check on his safety, fiancee said, ''No.''  ''I'm afraid that if he comes home, and I don't have eyes on him, then as soon as I'm leave, he's going to go out to do the same thing, and I won't be there to help him.''  Recommend continued inpatient.
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
## 2019-12-23 NOTE — ED Notes (Signed)
Pt resting quietly in room, equal rise and fall of chest

## 2019-12-24 ENCOUNTER — Emergency Department (HOSPITAL_COMMUNITY): Payer: Self-pay

## 2019-12-24 NOTE — BH Assessment (Signed)
Reassessment Note: Pt presents leaning on his bed, dressed in scrubs. He reports he is feeling impatient - not knowing a plan. Inpt process was explained to pt and he expressed thanks. Pt admits ongoing feelings of depression and states he has been depressed since he was 34 years old. He denies current SI, HI, AVH and paranoia. Inpt psychiatric tx continues to be recommended

## 2019-12-24 NOTE — ED Notes (Signed)
ED Provider at bedside. 
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
## 2019-12-25 NOTE — ED Notes (Signed)
Mom here for visitation and made aware of visiting rules.

## 2019-12-25 NOTE — ED Provider Notes (Signed)
Emergency Medicine Observation Re-evaluation Note  Ryan Kelley is a 34 y.o. male, seen on rounds today.  Pt initially presented to the ED for complaints of Drug Overdose Currently, the patient is awaiting placement  Physical Exam  BP 132/69 (BP Location: Right Arm)   Pulse (!) 52   Temp 98.6 F (37 C) (Oral)   Resp 16   Ht 5\' 5"  (1.651 m)   Wt 90.7 kg   SpO2 100%   BMI 33.28 kg/m  Physical Exam alert in no acute distress  ED Course / MDM  EKG:    I have reviewed the labs performed to date as well as medications administered while in observation.  Recent changes in the last 24 hours include none Plan  Current plan is for placement.    , MD 12/25/19 1304
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
## 2019-12-26 NOTE — Discharge Instructions (Addendum)
Please use the provided resources to proceed to treatment.  Return here for concerning changes in your condition.

## 2019-12-26 NOTE — ED Provider Notes (Signed)
Per behavioral health this morning, the patient is now appropriate for discharge, will proceed to outpatient therapy.   Gerhard Munch, MD 12/26/19 1057

## 2019-12-26 NOTE — Progress Notes (Signed)
CSW left message with pt's fiance' requesting a return phone call. Contact information on [facility] was faxed to [facility] for pt.   Wells Guiles, LCSW, LCAS Disposition CSW [facility] BHH/TTS [phone]

## 2019-12-31 DIAGNOSIS — F339 Major depressive disorder, recurrent, unspecified: Secondary | ICD-10-CM | POA: Insufficient documentation

## 2019-12-31 DIAGNOSIS — F1120 Opioid dependence, uncomplicated: Secondary | ICD-10-CM | POA: Insufficient documentation

## 2020-03-17 ENCOUNTER — Ambulatory Visit: Payer: Self-pay

## 2020-05-22 ENCOUNTER — Emergency Department
Admission: EM | Admit: 2020-05-22 | Discharge: 2020-05-23 | Disposition: A | Discharge status: Home / Self Care | Payer: Self-pay | Attending: Emergency Medicine | Admitting: Emergency Medicine

## 2020-05-22 ENCOUNTER — Other Ambulatory Visit: Payer: Self-pay

## 2020-05-22 DIAGNOSIS — F4310 Post-traumatic stress disorder, unspecified: Secondary | ICD-10-CM | POA: Insufficient documentation

## 2020-05-22 DIAGNOSIS — F1120 Opioid dependence, uncomplicated: Secondary | ICD-10-CM | POA: Diagnosis present

## 2020-05-22 DIAGNOSIS — F1110 Opioid abuse, uncomplicated: Secondary | ICD-10-CM | POA: Diagnosis present

## 2020-05-22 DIAGNOSIS — F1123 Opioid dependence with withdrawal: Secondary | ICD-10-CM | POA: Diagnosis present

## 2020-05-22 DIAGNOSIS — F1910 Other psychoactive substance abuse, uncomplicated: Secondary | ICD-10-CM | POA: Insufficient documentation

## 2020-05-22 DIAGNOSIS — Z20822 Contact with and (suspected) exposure to covid-19: Secondary | ICD-10-CM | POA: Insufficient documentation

## 2020-05-22 DIAGNOSIS — F1193 Opioid use, unspecified with withdrawal: Secondary | ICD-10-CM | POA: Diagnosis present

## 2020-05-22 DIAGNOSIS — R45851 Suicidal ideations: Secondary | ICD-10-CM | POA: Insufficient documentation

## 2020-05-22 DIAGNOSIS — F329 Major depressive disorder, single episode, unspecified: Secondary | ICD-10-CM | POA: Insufficient documentation

## 2020-05-22 DIAGNOSIS — F17210 Nicotine dependence, cigarettes, uncomplicated: Secondary | ICD-10-CM | POA: Insufficient documentation

## 2020-05-22 DIAGNOSIS — F1590 Other stimulant use, unspecified, uncomplicated: Secondary | ICD-10-CM | POA: Insufficient documentation

## 2020-05-22 LAB — URINE DRUG SCREEN, QUALITATIVE (ARMC ONLY)
Amphetamines, Ur Screen: POSITIVE — AB
Barbiturates, Ur Screen: NOT DETECTED
Benzodiazepine, Ur Scrn: POSITIVE — AB
Cannabinoid 50 Ng, Ur ~~LOC~~: POSITIVE — AB
Cocaine Metabolite,Ur ~~LOC~~: NOT DETECTED
MDMA (Ecstasy)Ur Screen: NOT DETECTED
Methadone Scn, Ur: NOT DETECTED
Opiate, Ur Screen: NOT DETECTED
Phencyclidine (PCP) Ur S: NOT DETECTED
Tricyclic, Ur Screen: POSITIVE — AB

## 2020-05-22 LAB — ETHANOL: Alcohol, Ethyl (B): <10 mg/dL (ref <10)

## 2020-05-22 LAB — COMPREHENSIVE METABOLIC PANEL
ALT: 24 U/L (ref 0–44)
AST: 23 U/L (ref 15–41)
Albumin: 4.8 g/dL (ref 3.5–5.0)
Alkaline Phosphatase: 82 U/L (ref 38–126)
Anion gap: 14 (ref 5–15)
BUN: 14 mg/dL (ref 6–20)
CO2: 23 mmol/L (ref 22–32)
Calcium: 9.5 mg/dL (ref 8.9–10.3)
Chloride: 98 mmol/L (ref 98–111)
Creatinine, Ser: 0.96 mg/dL (ref 0.61–1.24)
GFR, Estimated: >60 mL/min (ref >60)
Glucose, Bld: 102 mg/dL — ABNORMAL HIGH (ref 70–99)
Potassium: 4.3 mmol/L (ref 3.5–5.1)
Sodium: 135 mmol/L (ref 135–145)
Total Bilirubin: 0.7 mg/dL (ref 0.3–1.2)
Total Protein: 8.1 g/dL (ref 6.5–8.1)

## 2020-05-22 LAB — RESPIRATORY PANEL BY RT PCR (FLU A&B, COVID)
Influenza A by PCR: NEGATIVE
Influenza B by PCR: NEGATIVE
SARS Coronavirus 2 by RT PCR: NEGATIVE

## 2020-05-22 LAB — CBC
HCT: 45.5 % (ref 39.0–52.0)
Hemoglobin: 15.9 g/dL (ref 13.0–17.0)
MCH: 30.9 pg (ref 26.0–34.0)
MCHC: 34.9 g/dL (ref 30.0–36.0)
MCV: 88.3 fL (ref 80.0–100.0)
Platelets: 286 10*3/uL (ref 150–400)
RBC: 5.15 MIL/uL (ref 4.22–5.81)
RDW: 12.1 % (ref 11.5–15.5)
WBC: 15.7 10*3/uL — ABNORMAL HIGH (ref 4.0–10.5)
nRBC: 0 % (ref 0.0–0.2)

## 2020-05-22 LAB — TROPONIN I (HIGH SENSITIVITY)
Troponin I (High Sensitivity): 3 ng/L (ref <18)
Troponin I (High Sensitivity): 3 ng/L (ref <18)

## 2020-05-22 MED ORDER — METHADONE HCL 10 MG PO TABS
20.0000 mg | ORAL_TABLET | Freq: Once | ORAL | Status: AC
  Administered 2020-05-22: 20 mg via ORAL
  Filled 2020-05-22: qty 2

## 2020-05-22 NOTE — ED Notes (Signed)
Bed adjusted, lights out, warm blanket given.  Pt resting quietly

## 2020-05-22 NOTE — ED Notes (Signed)
Hourly rounding reveals patient in room. No complaints, stable, in no acute distress. Q15 minute rounds and monitoring via Rover and Officer to continue.   

## 2020-05-22 NOTE — ED Notes (Signed)
Pt. Transferred from Triage to room 22 after dressing out and screening for contraband. Report to include Situation, Background, Assessment and Recommendations from Raquel RN. Pt. Oriented to Quad including Q15 minute rounds as well as Psychologist, counselling for their protection. Patient is alert and oriented, warm and dry in no acute distress. Patient reported SI, and Heroin withdrawal. He said he used Heroin yesterday morning. Denied HI, and AVH. Pt. Encouraged to let me know if needs arise.

## 2020-05-22 NOTE — ED Notes (Signed)
All belongings removed and locked  Hotel manager Towel  And button up shirt

## 2020-05-22 NOTE — ED Provider Notes (Signed)
[facility] Emergency Department Provider Note ____________________________________________   First MD Initiated Contact with Patient 05/22/20 2020     (approximate)  I have reviewed the triage vital signs and the nursing notes.   HISTORY  Chief Complaint Suicidal    HPI Ryan Kelley is a 34 y.o. male with PMH as noted below including opiate abuse who presents with suicidal ideation.  The patient states that he has been using heroin daily recently until yesterday.  He also reports marijuana use, but denies other drugs.  He states that he has been feeling suicidal over the last day.  He does not have a specific plan, and has not attempted to hurt himself.  He states he feels like he has hit "rock bottom" in terms of his addiction.  He reports withdrawal symptoms including nasal congestion, nausea, and body aches, but denies other acute medical complaints.  Past Medical History:  Diagnosis Date  . Anxiety   . Attempted suicide (HCC)   . Chronic back pain   . Chronic knee pain   . Depression   . Insomnia   . PTSD (post-traumatic stress disorder)   . Substance abuse [facility])     Patient Active Problem List   Diagnosis Date Noted  . Opioid withdrawal (HCC) 07/08/2016  . Opioid dependence (HCC) 10/18/2012  . Opioid use with withdrawal (HCC) 10/18/2012    No past surgical history on file.  Prior to Admission medications   Medication Sig Start Date End Date Taking? Authorizing Provider  ibuprofen (ADVIL) 200 MG tablet Take 200 mg by mouth every 6 (six) hours as needed.    [provider]  SUMAtriptan (IMITREX) 50 MG tablet May repeat in 2 hours if headache persists or recurs.  Do not exceed more than 4 tablets in 24 hours Patient not taking: Reported on 11/25/2015 03/13/15 12/26/15  Burgess Amor, PA-C    Allergies Poison ivy extract [poison ivy extract]  Family History  Problem Relation Age of Onset  . Diabetes Mother   . Cancer Mother   .  Hypertension Mother   . Hypertension Father     Social History Social History   Tobacco Use  . Smoking status: Current Every Day Smoker    Packs/day: 1.00    Years: 7.00    Pack years: 7.00    Types: Cigarettes  . Smokeless tobacco: Never Used  Substance Use Topics  . Alcohol use: Yes    Comment: occasionally   . Drug use: Yes    Frequency: 5.0 times per week    Types: Hydrocodone, Oxycodone, Benzodiazepines, Marijuana    Comment: marijuana last used 07/06/16, heroin last used 07/07/16    Review of Systems  Constitutional: No fever/chills Eyes: No visual changes. ENT: Positive for nasal congestion. Cardiovascular: Denies chest pain. Respiratory: Denies shortness of breath. Gastrointestinal: Positive for nausea. Genitourinary: Negative for dysuria.  Musculoskeletal: Positive for body aches. Skin: Negative for rash. Neurological: Negative for headache.   ____________________________________________   PHYSICAL EXAM:  VITAL SIGNS: ED Triage Vitals  Enc Vitals Group     BP 05/22/20 1940 123/74     Pulse Rate 05/22/20 1940 (!) 134     Resp 05/22/20 1940 18     Temp 05/22/20 1940 99 F (37.2 C)     Temp Source 05/22/20 1940 Oral     SpO2 05/22/20 1940 96 %     Weight 05/22/20 1945 220 lb (99.8 kg)     Height 05/22/20 1945 5\' 5"  (  1.651 m)     Head Circumference --      Peak Flow --      Pain Score 05/22/20 1944 5     Pain Loc --      Pain Edu? --      Excl. in GC? --     Constitutional: Alert and oriented.  Relatively well appearing and in no acute distress. Eyes: Conjunctivae are normal.  Head: Atraumatic. Nose: No congestion/rhinnorhea. Mouth/Throat: Mucous membranes are moist.   Neck: Normal range of motion.  Cardiovascular: Tachycardic, regular rhythm.  Good peripheral circulation. Respiratory: Normal respiratory effort.  No retractions.  Gastrointestinal: No distention.  Musculoskeletal: Extremities warm and well perfused.  Neurologic:  Normal  speech and language. No gross focal neurologic deficits are appreciated.  Skin:  Skin is warm and dry. No rash noted. Psychiatric: Mood and affect are normal. Speech and behavior are normal.  ____________________________________________   LABS (all labs ordered are listed, but only abnormal results are displayed)  Labs Reviewed  CBC - Abnormal; Notable for the following components:      Result Value   WBC 15.7 (*)    All other components within normal limits  COMPREHENSIVE METABOLIC PANEL - Abnormal; Notable for the following components:   Glucose, Bld 102 (*)    All other components within normal limits  URINE DRUG SCREEN, QUALITATIVE ([facility] ONLY) - Abnormal; Notable for the following components:   Tricyclic, Ur Screen POSITIVE (*)    Amphetamines, Ur Screen POSITIVE (*)    Cannabinoid 50 Ng, Ur Jay POSITIVE (*)    Benzodiazepine, Ur Scrn POSITIVE (*)    All other components within normal limits  RESPIRATORY PANEL BY RT PCR (FLU A&B, COVID)  ETHANOL  TROPONIN I (HIGH SENSITIVITY)  TROPONIN I (HIGH SENSITIVITY)   ____________________________________________  EKG   ____________________________________________  RADIOLOGY    ____________________________________________   PROCEDURES  Procedure(s) performed: No  Procedures  Critical Care performed: No ____________________________________________   INITIAL IMPRESSION / ASSESSMENT AND PLAN / ED COURSE  Pertinent labs & imaging results that were available during my care of the patient were reviewed by me and considered in my medical decision making (see chart for details).  34 year old male with PMH as noted above including a history of substance abuse presents with suicidal ideation in the context of recent increased heroin use.  The patient states that his last use was early yesterday.  He feels like he is in withdrawal, with body aches, nausea, and nasal congestion.  He denies any specific suicidal method or  plan.  The patient was most recently seen in the ED in May with a drug overdose, and also had suicidal ideation at that time although was ultimately cleared by psychiatry.  On exam, currently the patient is overall well-appearing.  His vital signs are normal except for tachycardia.  The physical exam is otherwise unremarkable.  Lab work-up was obtained for medical clearance.  UDS is positive for tricyclics, amphetamines, cannabinoids, and benzodiazepines although not opiates.  Labs are otherwise unremarkable except for mildly elevated WBC count.  Given the patient's self-reported history of opiate abuse and symptoms consistent with opiate withdrawal, I ordered a small dose of methadone, however the patient appears to be abusing multiple substances.  I suspect that the tachycardia is related to either amphetamine abuse or opiate withdrawal.  At this time, he is able to contract for safety and denies active suicidal thoughts.  He states he is here to get help.  Therefore, I have  not placed him under involuntary commitment at this time.  I have ordered psychiatry and TTS consults.  Disposition will be based on psychiatry team recommendations.  _______________________  The patient has been placed in psychiatric observation due to the need to provide a safe environment for the patient while obtaining psychiatric consultation and evaluation, as well as ongoing medical and medication management to treat the patient's condition.  The patient has not been placed under full IVC at this time.  ----------------------------------------- 11:38 PM on 05/22/2020 -----------------------------------------  Per the psychiatry NP, the patient does not require inpatient admission.  He will need evaluation for detox/rehab. ____________________________________________   FINAL CLINICAL IMPRESSION(S) / ED DIAGNOSES  Final diagnoses:  Polysubstance abuse (HCC)      NEW MEDICATIONS STARTED DURING THIS  VISIT:  New Prescriptions   No medications on file     Note:  This document was prepared using Dragon voice recognition software and may include unintentional dictation errors.   Dionne Bucy, MD 05/22/20 [phone]

## 2020-05-22 NOTE — ED Triage Notes (Signed)
Pt states he has a heroin addiction last time used yesterday. Pt has been treated for depression in the past but not currently taking any meds. Pt has suicidal thoughts no plan at this time.

## 2020-05-23 NOTE — BH Assessment (Signed)
Assessment Note  Ryan Kelley is an 34 y.o. male. Per triage note: Pt states he has a heroin addiction last time used yesterday. Pt has been treated for depression in the past but not currently taking any meds. Pt has suicidal thoughts no plan at this time.  Pt presented in scrubs and was resting upon this writer's arrival. Pt spoke had slurred, tremulous speech. Motor behavior appears normal evidenced by pt.'s freedom of movement. Patient's thought process was coherent and relevant. Eye contact was poor, and the pt. was drowsy ox4. Pt's mood was despondent, affect is depressed. Patient reported having worsening feelings of depression and vague thoughts of SI w/o a plan. When asked what had brought him to the hospital the pt. stated, "Hitting rock bottom; didn't know what to do". Pt reported symptoms of depression such as hopelessness, worthlessness, and anhedonia. Pt described his sleep as irregular and his appetite as poor. Pt reported that he is not connected to a psychiatrist or therapist.   Pt admits to poly substance use. Pt stated, "I knew I needed to come get help, or the drugs are going to do it"; in the context of overdosing. Pt denies current suicidality. The patient also denies HI, AV/hallucinations, or symptoms of paranoia currently.   Collateral contact [name]: [name] reported that the pt has had chronic addiction issues since age 71. [name] explained that the pt's drug abuse is getting increasingly worse as he and his spouse are being evicted due to pt spending bill money on drugs. [name] reported that the pt has never done long term treatment and only completed 30 days at [facility]. [name] explained that the treatment was helpful however the pt ended up relapsing while waiting on a treatment bed at a facility upon being [facility] from [facility]. [name] reported pt has been in prison for 2 years in efforts to steal to obtain money for drugs. [name] explained  that the pt recently got out in March 2021. [name] reported pt's oldest brother overdosed 16 years ago and she fears that the same will happen to the pt. if he doesn't get the help he needs.    Diagnosis: Polysubstance Depression PTSD  Past Medical History:  Past Medical History:  Diagnosis Date  . Anxiety   . Attempted suicide (HCC)   . Chronic back pain   . Chronic knee pain   . Depression   . Insomnia   . PTSD (post-traumatic stress disorder)   . Substance abuse (HCC)     No past surgical history on file.  Family History:  Family History  Problem Relation Age of Onset  . Diabetes Mother   . Cancer Mother   . Hypertension Mother   . Hypertension Father     Social History:  reports that he has been smoking cigarettes. He has a 7.00 pack-year smoking history. He has never used smokeless tobacco. He reports current alcohol use. He reports current drug use. Frequency: 5.00 times per week. Drugs: Hydrocodone, Oxycodone, Benzodiazepines, and Marijuana.  Additional Social History:  Alcohol / Drug Use Pain Medications: See PTA Prescriptions: See PTA History of alcohol / drug use?: Yes Substance #1 Name of Substance 1: Heroin 1 - Last Use / Amount: 05/21/20 Substance #2 Name of Substance 2: Opioids  CIWA: CIWA-Ar BP: 123/74 Pulse Rate: (!) 141 COWS:    Allergies:  Allergies  Allergen Reactions  . Poison Ivy Extract [Poison Ivy Extract]     Home Medications: (Not in a hospital admission)  OB/GYN Status:  No LMP for male patient.  General Assessment Data Location of Assessment: [facility] ED TTS Assessment: In system Is this a Tele or Face-to-Face Assessment?: Face-to-Face Is this an Initial Assessment or a Re-assessment for this encounter?: Initial Assessment Patient Accompanied by:: N/A Language Other than English: No Living Arrangements: Other (Comment) What gender do you identify as?: Male Date Telepsych consult ordered in [facility]: 05/22/20 Time Telepsych  consult ordered in [facility]: 2052 Marital status: Married [place] name: n/a Pregnancy Status: No Living Arrangements: Spouse/significant other Can pt return to current living arrangement?: Yes Admission Status: Voluntary Is patient capable of signing voluntary admission?: Yes Referral Source: Self/Family/Friend Insurance type: None  Medical Screening Exam [facility] Walk-in ONLY) Medical Exam completed: Yes  Crisis Care Plan Living Arrangements: Spouse/significant other Legal Guardian: Other: (Self) Name of Psychiatrist: None noted Name of Therapist: None noted  Education Status Is patient currently in school?: No Is the patient employed, unemployed or receiving disability?: Employed  Risk to self with the past 6 months Suicidal Ideation: No Has patient been a risk to self within the past 6 months prior to admission? : Other (comment) (Pt has a ) Suicidal Intent: No Has patient had any suicidal intent within the past 6 months prior to admission? : No Is patient at risk for suicide?: No Suicidal Plan?: No Has patient had any suicidal plan within the past 6 months prior to admission? : No Access to Means: No What has been your use of drugs/alcohol within the last 12 months?: Heroin; polysubstance Previous Attempts/Gestures: No How many times?: 0 Triggers for Past Attempts: None known Intentional Self Injurious Behavior: None Family Suicide History: Unknown Recent stressful life event(s): Conflict (Comment) Persecutory voices/beliefs?: No Depression: Yes Depression Symptoms: Insomnia, Feeling worthless/self pity, Despondent Substance abuse history and/or treatment for substance abuse?: Yes Suicide prevention information given to non-admitted patients: Not applicable  Risk to Others within the past 6 months Homicidal Ideation: No Does patient have any lifetime risk of violence toward others beyond the six months prior to admission? : No Thoughts of Harm to Others: No Current  Homicidal Intent: No Current Homicidal Plan: No Access to Homicidal Means: No Identified Victim: n/a History of harm to others?: No Assessment of Violence: None Noted Does patient have access to weapons?: No Criminal Charges Pending?: No Does patient have a court date: No Is patient on probation?: No  Psychosis Hallucinations: None noted Delusions: None noted  Mental Status Report Appearance/Hygiene: In scrubs Eye Contact: Poor Motor Activity: Freedom of movement Speech: Slurred Level of Consciousness: Drowsy Mood: Depressed, Despair, Worthless, low self-esteem Affect: Depressed Anxiety Level: Minimal Thought Processes: Relevant, Coherent Judgement: Impaired Orientation: Person, Place, Time, Situation Obsessive Compulsive Thoughts/Behaviors: Unable to Assess  Cognitive Functioning Concentration: Decreased Memory: Remote Intact, Recent Intact Is patient IDD: No Insight: Fair Impulse Control: Fair Appetite: Poor Have you had any weight changes? : No Change Sleep: Decreased Total Hours of Sleep:  (Pt describes sleep as irregular) Vegetative Symptoms: None  ADLScreening [facility] Assessment Services) Patient's cognitive ability adequate to safely complete daily activities?: Yes Patient able to express need for assistance with ADLs?: Yes Independently performs ADLs?: Yes (appropriate for developmental age)  Prior Inpatient Therapy Prior Inpatient Therapy: Yes Prior Therapy Dates: 2006, 2014, 2015, 2017 Prior Therapy Facilty/Provider(s): [facility] Reason for Treatment: Depression  Prior Outpatient Therapy Prior Outpatient Therapy: No Does patient have an ACCT team?: No Does patient have Intensive In-House Services?  : No Does patient have [facility] services? : No Does patient  have P4CC services?: No  ADL Screening (condition at time of admission) Patient's cognitive ability adequate to safely complete daily activities?: Yes Is the patient deaf or have difficulty hearing?:  No Does the patient have difficulty seeing, even when wearing glasses/contacts?: No Does the patient have difficulty concentrating, remembering, or making decisions?: No Patient able to express need for assistance with ADLs?: Yes Does the patient have difficulty dressing or bathing?: No Independently performs ADLs?: Yes (appropriate for developmental age) Does the patient have difficulty walking or climbing stairs?: No Weakness of Legs: None Weakness of Arms/Hands: None  Home Assistive Devices/Equipment Home Assistive Devices/Equipment: None  Therapy Consults (therapy consults require a physician order) PT Evaluation Needed: No OT Evalulation Needed: No SLP Evaluation Needed: No Abuse/Neglect Assessment (Assessment to be complete while patient is alone) Abuse/Neglect Assessment Can Be Completed: Unable to assess, patient is non-responsive or altered mental status Values / Beliefs Cultural Requests During Hospitalization: None Spiritual Requests During Hospitalization: None Consults Spiritual Care Consult Needed: No Transition of Care Team Consult Needed: No            Disposition: Per psych NP Annice Pih T., pt is recommended for rehab/substance abuse treatment.  Disposition Initial Assessment Completed for this Encounter: Yes  On Site Evaluation by:   Reviewed with Physician:    Foy Guadalajara 05/23/2020 1:18 AM

## 2020-05-23 NOTE — Consult Note (Signed)
[facility] Face-to-Face Psychiatry Consult   Reason for Consult: Suicidal Referring Physician: Dr. Marisa Severin Patient Identification: Ryan Kelley MRN:  [id number] Principal Diagnosis: <principal problem not specified> Diagnosis:  Active Problems:   Opioid dependence (HCC)   Opioid use with withdrawal (HCC)   Opioid withdrawal (HCC)   Total Time spent with patient: 30 minutes  Subjective: " I was feeling bad because I hit rock bottom with my drug use.  I would like to get help and I am open to going to substance abuse treatment facility." Ryan Kelley is a 34 y.o. male patient presented to [facility] ED. via POV voluntarily. The patient was seen resting in bed with eyes closed.  " I was sleeping, and your woke me up."  "I am not suicidal anymore."  The patient slurs his words when he answers questions. Per the ED triage nurse note, Pt states he had a heroin addiction last time he used it yesterday. Pt has been treated for depression in the past but not currently taking any meds. Pt has suicidal thoughts no plan at this time.  The patient was seen face-to-face by this provider; the chart was reviewed and consulted with Dr. Marisa Severin on 05/22/2020 due to the patient's care. It was discussed with the EDP that the patient does not meet the criteria to be admitted to the psychiatric inpatient unit. The patient could benefit from substance abuse treatment which he voiced interest in attending. On evaluation, the patient is alert and oriented x 4; speech is slurring but calm, cooperative, and mood-congruent with affect. The patient does not appear to be responding to internal or external stimuli. Neither is the patient presenting with any delusional thinking. The patient denies auditory or visual hallucinations. The patient denies suicidal, homicidal, or self-harm ideations. The patient is not presenting with any psychotic or paranoid behaviors. During an encounter with the patient, he was able to answer questions  appropriately.. Per TTS Ms. Faulcon who spoke to the patient's mom Ryan Kelley [phone]. Ryan reported that the pt has had chronic addiction issues since age 23. Ryan explained that the pt's drug abuse is getting increasingly worse as he and his spouse are being evicted due to pt spending bill money on drugs. Ryan reported that the pt has never done long term treatment and only completed 30 days at [facility]. Ryan explained that the treatment was helpful however the pt ended up relapsing while waiting on a treatment bed at a facility upon being released from [facility]. Ryan reported pt has been in prison for 2 years in efforts to steal to obtain money for drugs. Ryan explained that the pt recently got out in March 2021. Ryan reported pt's oldest brother overdosed 16 years ago and she fears that the same will happen to the pt. if he doesn't get the help he needs.   HPI: Per Dr. Marisa Severin; Ryan Kelley is a 34 y.o. male with PMH as noted below including opiate abuse who presents with suicidal ideation.  The patient states that he has been using heroin daily recently until yesterday.  He also reports marijuana use, but denies other drugs.  He states that he has been feeling suicidal over the last day.  He does not have a specific plan, and has not attempted to hurt himself.  He states he feels like he has hit "rock bottom" in terms of his addiction.  He reports withdrawal symptoms including nasal congestion, nausea, and body aches, but denies other acute medical complaints.  Past Psychiatric History:  Anxiety Attempted suicide (HCC) Depression Insomnia PTSD (post-traumatic stress disorder) Substance abuse (HCC)  Risk to Self: Suicidal Ideation: No Suicidal Intent: No Is patient at risk for suicide?: No Suicidal Plan?: No Access to Means: No What has been your use of drugs/alcohol within the last 12 months?: Heroin; polysubstance How many times?: 0 Other Self Harm  Risks: Pt has  Risk to Others:  No Prior Inpatient Therapy:  Yes Prior Outpatient Therapy:  Yes  Past Medical History:  Past Medical History:  Diagnosis Date  . Anxiety   . Attempted suicide (HCC)   . Chronic back pain   . Chronic knee pain   . Depression   . Insomnia   . PTSD (post-traumatic stress disorder)   . Substance abuse (HCC)    No past surgical history on file. Family History:  Family History  Problem Relation Age of Onset  . Diabetes Mother   . Cancer Mother   . Hypertension Mother   . Hypertension Father    Family Psychiatric  History:  Social History:  Social History   Substance and Sexual Activity  Alcohol Use Yes   Comment: occasionally      Social History   Substance and Sexual Activity  Drug Use Yes  . Frequency: 5.0 times per week  . Types: Hydrocodone, Oxycodone, Benzodiazepines, Marijuana   Comment: marijuana last used 07/06/16, heroin last used 07/07/16    Social History   Socioeconomic History  . Marital status: Single    Spouse name: Not on file  . Number of children: Not on file  . Years of education: Not on file  . Highest education level: Not on file  Occupational History  . Not on file  Tobacco Use  . Smoking status: Current Every Day Smoker    Packs/day: 1.00    Years: 7.00    Pack years: 7.00    Types: Cigarettes  . Smokeless tobacco: Never Used  Substance and Sexual Activity  . Alcohol use: Yes    Comment: occasionally   . Drug use: Yes    Frequency: 5.0 times per week    Types: Hydrocodone, Oxycodone, Benzodiazepines, Marijuana    Comment: marijuana last used 07/06/16, heroin last used 07/07/16  . Sexual activity: Not on file  Other Topics Concern  . Not on file  Social History Narrative   ** Merged History Encounter **       Social Determinants of Health   Financial Resource Strain:   . Difficulty of Paying Living Expenses: Not on file  Food Insecurity:   . Worried About Programme researcher, broadcasting/film/video in the Last Year:  Not on file  . Ran Out of Food in the Last Year: Not on file  Transportation Needs:   . Lack of Transportation (Medical): Not on file  . Lack of Transportation (Non-Medical): Not on file  Physical Activity:   . Days of Exercise per Week: Not on file  . Minutes of Exercise per Session: Not on file  Stress:   . Feeling of Stress : Not on file  Social Connections:   . Frequency of Communication with Friends and Family: Not on file  . Frequency of Social Gatherings with Friends and Family: Not on file  . Attends Religious Services: Not on file  . Active Member of Clubs or Organizations: Not on file  . Attends Banker Meetings: Not on file  . Marital Status: Not on file   Additional Social History:  Allergies:   Allergies  Allergen Reactions  . Poison Ivy Extract [Poison Ivy Extract]     Labs:  Results for orders placed or performed during the hospital encounter of 05/22/20 (from the past 48 hour(s))  CBC     Status: Abnormal   Collection Time: 05/22/20  7:47 PM  Result Value Ref Range   WBC 15.7 (H) 4.0 - 10.5 K/uL   RBC 5.15 4.22 - 5.81 MIL/uL   Hemoglobin 15.9 13.0 - 17.0 g/dL   HCT 16.6 39 - 52 %   MCV 88.3 80.0 - 100.0 fL   MCH 30.9 26.0 - 34.0 pg   MCHC 34.9 30.0 - 36.0 g/dL   RDW 06.3 01.6 - 01.0 %   Platelets 286 150 - 400 K/uL   nRBC 0.0 0.0 - 0.2 %    Comment: Performed at [facility], [address] [place], [place] [address]  Comprehensive metabolic panel     Status: Abnormal   Collection Time: 05/22/20  7:47 PM  Result Value Ref Range   Sodium 135 135 - 145 mmol/L   Potassium 4.3 3.5 - 5.1 mmol/L   Chloride 98 98 - 111 mmol/L   CO2 23 22 - 32 mmol/L   Glucose, Bld 102 (H) 70 - 99 mg/dL    Comment: Glucose reference range applies only to samples taken after fasting for at least 8 hours.   BUN 14 6 - 20 mg/dL   Creatinine, Ser 5.73 0.61 - 1.24 mg/dL   Calcium 9.5 8.9 - 22.0 mg/dL   Total Protein 8.1 6.5 - 8.1 g/dL   Albumin 4.8  3.5 - 5.0 g/dL   AST 23 15 - 41 U/L   ALT 24 0 - 44 U/L   Alkaline Phosphatase 82 38 - 126 U/L   Total Bilirubin 0.7 0.3 - 1.2 mg/dL   GFR, Estimated >25 >42 mL/min   Anion gap 14 5 - 15    Comment: Performed at [facility], [address] [place], [place] [address]  Troponin I (High Sensitivity)     Status: None   Collection Time: 05/22/20  7:47 PM  Result Value Ref Range   Troponin I (High Sensitivity) 3 <18 ng/L    Comment: (NOTE) Elevated high sensitivity troponin I (hsTnI) values and significant  changes across serial measurements may suggest ACS but many other  chronic and acute conditions are known to elevate hsTnI results.  Refer to the "Links" section for chest pain algorithms and additional  guidance. Performed at [facility], [address] [place], [place] [address]   Ethanol     Status: None   Collection Time: 05/22/20  7:47 PM  Result Value Ref Range   Alcohol, Ethyl (B) <10 <10 mg/dL    Comment: (NOTE) Lowest detectable limit for serum alcohol is 10 mg/dL.  For medical purposes only. Performed at [facility], [address], [place], [place] [address]   Urine Drug Screen, Qualitative [facility] only)     Status: Abnormal   Collection Time: 05/22/20  7:47 PM  Result Value Ref Range   Tricyclic, Ur Screen POSITIVE (A) NONE DETECTED   Amphetamines, Ur Screen POSITIVE (A) NONE DETECTED   MDMA (Ecstasy)Ur Screen NONE DETECTED NONE DETECTED   Cocaine Metabolite,Ur [place] NONE DETECTED NONE DETECTED   Opiate, Ur Screen NONE DETECTED NONE DETECTED   Phencyclidine (PCP) Ur S NONE DETECTED NONE DETECTED   Cannabinoid 50 Ng, Ur  POSITIVE (A) NONE DETECTED   Barbiturates,  Ur Screen NONE DETECTED NONE DETECTED   Benzodiazepine, Ur Scrn POSITIVE (A) NONE DETECTED   Methadone Scn, Ur NONE DETECTED NONE DETECTED    Comment: (NOTE) Tricyclics + metabolites, urine    Cutoff 1000 ng/mL Amphetamines + metabolites, urine  Cutoff 1000 ng/mL MDMA  (Ecstasy), urine              Cutoff 500 ng/mL Cocaine Metabolite, urine          Cutoff 300 ng/mL Opiate + metabolites, urine        Cutoff 300 ng/mL Phencyclidine (PCP), urine         Cutoff 25 ng/mL Cannabinoid, urine                 Cutoff 50 ng/mL Barbiturates + metabolites, urine  Cutoff 200 ng/mL Benzodiazepine, urine              Cutoff 200 ng/mL Methadone, urine                   Cutoff 300 ng/mL  The urine drug screen provides only a preliminary, unconfirmed analytical test result and should not be used for non-medical purposes. Clinical consideration and professional judgment should be applied to any positive drug screen result due to possible interfering substances. A more specific alternate chemical method must be used in order to obtain a confirmed analytical result. Gas chromatography / mass spectrometry (GC/MS) is the preferred confirm atory method. Performed at [facility], [address] [place], [place] [address]   Respiratory Panel by RT PCR (Flu A&B, Covid) - Nasopharyngeal Swab     Status: None   Collection Time: 05/22/20  8:43 PM   Specimen: Nasopharyngeal Swab  Result Value Ref Range   SARS Coronavirus 2 by RT PCR NEGATIVE NEGATIVE    Comment: (NOTE) SARS-CoV-2 target nucleic acids are NOT DETECTED.  The SARS-CoV-2 RNA is generally detectable in upper respiratoy specimens during the acute phase of infection. The lowest concentration of SARS-CoV-2 viral copies this assay can detect is 131 copies/mL. A negative result does not preclude SARS-Cov-2 infection and should not be used as the sole basis for treatment or other patient management decisions. A negative result may occur with  improper specimen collection/handling, submission of specimen other than nasopharyngeal swab, presence of viral mutation(s) within the areas targeted by this assay, and inadequate number of viral copies (<131 copies/mL). A negative result must be combined with  clinical observations, patient history, and epidemiological information. The expected result is Negative.  Fact Sheet for Patients:  https://www.moore.com/  Fact Sheet for Healthcare Providers:  https://www.young.biz/  This test is no t yet approved or cleared by the [place] FDA and  has been authorized for detection and/or diagnosis of SARS-CoV-2 by FDA under an Emergency Use Authorization (EUA). This EUA will remain  in effect (meaning this test can be used) for the duration of the COVID-19 declaration under Section 564(b)(1) of the Act, 21 U.S.C. section 360bbb-3(b)(1), unless the authorization is terminated or revoked sooner.     Influenza A by PCR NEGATIVE NEGATIVE   Influenza B by PCR NEGATIVE NEGATIVE    Comment: (NOTE) The Xpert Xpress SARS-CoV-2/FLU/RSV assay is intended as an aid in  the diagnosis of influenza from Nasopharyngeal swab specimens and  should not be used as a sole basis for treatment. Nasal washings and  aspirates are unacceptable for Xpert Xpress SARS-CoV-2/FLU/RSV  testing.  Fact Sheet for Patients: https://www.moore.com/  Fact Sheet for Healthcare Providers: https://www.young.biz/  This test is not yet approved or cleared by the [place] FDA and  has been authorized for detection and/or diagnosis of SARS-CoV-2 by  FDA under an Emergency Use Authorization (EUA). This EUA will remain  in effect (meaning this test can be used) for the duration of the  Covid-19 declaration under Section 564(b)(1) of the Act, 21  U.S.C. section 360bbb-3(b)(1), unless the authorization is  terminated or revoked. Performed at [facility], [address] [place], [place] [address]   Troponin I (High Sensitivity)     Status: None   Collection Time: 05/22/20 10:46 PM  Result Value Ref Range   Troponin I (High Sensitivity) 3 <18 ng/L    Comment: (NOTE) Elevated high  sensitivity troponin I (hsTnI) values and significant  changes across serial measurements may suggest ACS but many other  chronic and acute conditions are known to elevate hsTnI results.  Refer to the "Links" section for chest pain algorithms and additional  guidance. Performed at [facility], [address] [place], [place] [address]     No current facility-administered medications for this encounter.   Current Outpatient Medications  Medication Sig Dispense Refill  . ibuprofen (ADVIL) 200 MG tablet Take 200 mg by mouth every 6 (six) hours as needed.      Musculoskeletal: Strength & Muscle Tone: within normal limits Gait & Station: normal Patient leans: N/A  Psychiatric Specialty Exam: Physical Exam Vitals and nursing note reviewed.     Review of Systems  Blood pressure 123/74, pulse (!) 141, temperature 99 F (37.2 C), temperature source Oral, resp. rate 18, height 5\' 5"  (1.651 m), weight 99.8 kg, SpO2 96 %.Body mass index is 36.61 kg/m.  General Appearance: Casual  Eye Contact:  Poor  Speech:  Garbled and Slow  Volume:  Normal  Mood:  Depressed  Affect:  Congruent and Depressed  Thought Process:  Coherent  Orientation:  Full (Time, Place, and Person)  Thought Content:  Logical  Suicidal Thoughts:  No  Homicidal Thoughts:  No  Memory:  Immediate;   Good Recent;   Good Remote;   Good  Judgement:  Fair  Insight:  Fair  Psychomotor Activity:  Normal  Concentration:  Concentration: Good and Attention Span: Good  Recall:  Good  Fund of Knowledge:  Good  Language:  Good  Akathisia:  Negative  Handed:  Right  AIMS (if indicated):     Assets:  Communication Skills Desire for Improvement Resilience Social Support  ADL's:  Intact  Cognition:  WNL  Sleep:   Fair     Treatment Plan Summary: Plan Patient does not meet criteria for psychiatric inpatient admission.  The patient will benefit for substance use disorder treatment which she is open to  going to a treatment facility.  Disposition: No evidence of imminent risk to self or others at present.   Patient does not meet criteria for psychiatric inpatient admission. Supportive therapy provided about ongoing stressors. Refer to IOP. Discussed crisis plan, support from social network, calling 911, coming to the Emergency Department, and calling Suicide Hotline.  Gillermo MurdochJacqueline Felipe Paluch, NP 05/23/2020 1:09 AM

## 2020-05-23 NOTE — BH Assessment (Signed)
Leonarda Salon, RN of [facility] requested update pulse rate readings. Updated vitals faxed at 6:43 AM.

## 2020-05-23 NOTE — BH Assessment (Signed)
Patient has been accepted to [facility]. Call report to Shift leader at [phone].  Representative was Leonarda Salon, Charity fundraiser.   ER Staff is aware of it:  Bonita Quin, ER Secretary  Dr. Larinda Buttery, ER MD  Selena Batten Patient's Nurse

## 2020-05-23 NOTE — BH Assessment (Signed)
Referral information for Psychiatric Hospitalization faxed to;   . [facility] [phone])  . [facility] [phone])  . [facility] [phone])  . [facility] [phone])    [facility] [phone])

## 2020-05-27 ENCOUNTER — Encounter: Payer: Self-pay | Admitting: Emergency Medicine

## 2020-05-27 ENCOUNTER — Emergency Department
Admission: EM | Admit: 2020-05-27 | Discharge: 2020-05-28 | Disposition: A | Discharge status: Home / Self Care | Payer: Self-pay | Attending: Emergency Medicine | Admitting: Emergency Medicine

## 2020-05-27 ENCOUNTER — Other Ambulatory Visit: Payer: Self-pay

## 2020-05-27 DIAGNOSIS — F1510 Other stimulant abuse, uncomplicated: Secondary | ICD-10-CM

## 2020-05-27 DIAGNOSIS — Z20822 Contact with and (suspected) exposure to covid-19: Secondary | ICD-10-CM | POA: Insufficient documentation

## 2020-05-27 DIAGNOSIS — F17210 Nicotine dependence, cigarettes, uncomplicated: Secondary | ICD-10-CM | POA: Insufficient documentation

## 2020-05-27 DIAGNOSIS — F329 Major depressive disorder, single episode, unspecified: Secondary | ICD-10-CM | POA: Insufficient documentation

## 2020-05-27 DIAGNOSIS — Y909 Presence of alcohol in blood, level not specified: Secondary | ICD-10-CM | POA: Insufficient documentation

## 2020-05-27 DIAGNOSIS — F1310 Sedative, hypnotic or anxiolytic abuse, uncomplicated: Secondary | ICD-10-CM

## 2020-05-27 DIAGNOSIS — F1120 Opioid dependence, uncomplicated: Secondary | ICD-10-CM | POA: Insufficient documentation

## 2020-05-27 DIAGNOSIS — F1290 Cannabis use, unspecified, uncomplicated: Secondary | ICD-10-CM | POA: Insufficient documentation

## 2020-05-27 DIAGNOSIS — F1320 Sedative, hypnotic or anxiolytic dependence, uncomplicated: Secondary | ICD-10-CM | POA: Insufficient documentation

## 2020-05-27 DIAGNOSIS — F1110 Opioid abuse, uncomplicated: Secondary | ICD-10-CM | POA: Diagnosis present

## 2020-05-27 DIAGNOSIS — F1193 Opioid use, unspecified with withdrawal: Secondary | ICD-10-CM

## 2020-05-27 DIAGNOSIS — F1910 Other psychoactive substance abuse, uncomplicated: Secondary | ICD-10-CM | POA: Insufficient documentation

## 2020-05-27 DIAGNOSIS — R45851 Suicidal ideations: Secondary | ICD-10-CM | POA: Insufficient documentation

## 2020-05-27 LAB — CBC
HCT: 47.6 % (ref 39.0–52.0)
Hemoglobin: 16.8 g/dL (ref 13.0–17.0)
MCH: 31.1 pg (ref 26.0–34.0)
MCHC: 35.3 g/dL (ref 30.0–36.0)
MCV: 88 fL (ref 80.0–100.0)
Platelets: 320 10*3/uL (ref 150–400)
RBC: 5.41 MIL/uL (ref 4.22–5.81)
RDW: 12.5 % (ref 11.5–15.5)
WBC: 10.7 10*3/uL — ABNORMAL HIGH (ref 4.0–10.5)
nRBC: 0 % (ref 0.0–0.2)

## 2020-05-27 LAB — URINE DRUG SCREEN, QUALITATIVE (ARMC ONLY)
Amphetamines, Ur Screen: NOT DETECTED
Barbiturates, Ur Screen: NOT DETECTED
Benzodiazepine, Ur Scrn: POSITIVE — AB
Cannabinoid 50 Ng, Ur ~~LOC~~: POSITIVE — AB
Cocaine Metabolite,Ur ~~LOC~~: NOT DETECTED
MDMA (Ecstasy)Ur Screen: NOT DETECTED
Methadone Scn, Ur: POSITIVE — AB
Opiate, Ur Screen: POSITIVE — AB
Phencyclidine (PCP) Ur S: NOT DETECTED
Tricyclic, Ur Screen: POSITIVE — AB

## 2020-05-27 LAB — COMPREHENSIVE METABOLIC PANEL
ALT: 22 U/L (ref 0–44)
AST: 24 U/L (ref 15–41)
Albumin: 4.8 g/dL (ref 3.5–5.0)
Alkaline Phosphatase: 79 U/L (ref 38–126)
Anion gap: 9 (ref 5–15)
BUN: 13 mg/dL (ref 6–20)
CO2: 29 mmol/L (ref 22–32)
Calcium: 9.3 mg/dL (ref 8.9–10.3)
Chloride: 100 mmol/L (ref 98–111)
Creatinine, Ser: 0.87 mg/dL (ref 0.61–1.24)
GFR, Estimated: >60 mL/min (ref >60)
Glucose, Bld: 91 mg/dL (ref 70–99)
Potassium: 3.9 mmol/L (ref 3.5–5.1)
Sodium: 138 mmol/L (ref 135–145)
Total Bilirubin: 0.6 mg/dL (ref 0.3–1.2)
Total Protein: 8.1 g/dL (ref 6.5–8.1)

## 2020-05-27 LAB — SALICYLATE LEVEL: Salicylate Lvl: <7 mg/dL — ABNORMAL LOW (ref 7.0–30.0)

## 2020-05-27 LAB — ETHANOL: Alcohol, Ethyl (B): <10 mg/dL (ref <10)

## 2020-05-27 LAB — ACETAMINOPHEN LEVEL: Acetaminophen (Tylenol), Serum: <10 ug/mL — ABNORMAL LOW (ref 10–30)

## 2020-05-27 MED ORDER — LORAZEPAM 1 MG PO TABS
1.0000 mg | ORAL_TABLET | Freq: Once | ORAL | Status: AC
  Administered 2020-05-27: 1 mg via ORAL
  Filled 2020-05-27: qty 1

## 2020-05-27 NOTE — ED Notes (Signed)
Pt reports that he has a heroin addiction x 7 years. He feels hopeless after a break up with his fiance and feels like he wants to get help so he will not take action to take his life. Pt alert & oriented, sad and tearful during assessment.

## 2020-05-27 NOTE — ED Notes (Signed)
VOLUNTARY/awaiting referral to [facility]

## 2020-05-27 NOTE — BH Assessment (Addendum)
At Dr. Toni Amend request, TTS referred pt to [facility].   Carolyn ([facility]) confirmed receiving pt's referral and reports being unable to accept pt due his private insurance.   TTS will follow up with provider.

## 2020-05-27 NOTE — ED Triage Notes (Signed)
Pt presents to ED via POV with c/o suicidal thoughts. Pt states has been seen before for same. Pt c/o depression, pt states snorts heroin. Pt states last use was last night. Pt states was previously sent "somewhere" but had to leave due to "unfinished business" that made it so he "couldn't focus". Pt denies plan at this time. A&O x4, calm and cooperative in triage.   Pt also endorses snorting ritalin this morning, states does not have a prescription, also endorses using benzos that he does not have a prescription for.

## 2020-05-27 NOTE — ED Provider Notes (Signed)
[facility] Emergency Department Provider Note ____________________________________________   First MD Initiated Contact with Patient 05/27/20 1629     (approximate)  I have reviewed the triage vital signs and the nursing notes.  HISTORY  Chief Complaint Suicidal   HPI Ryan Kelley is a 34 y.o. malewho presents to the ED for evaluation of suicidality  Chart review indicates history of depression, anxiety, PTSD, polysubstance abuse and previous suicide attempt.  Last seen in our ED 5 days ago for similar complaint of suicidal ideations.  Patient was discharged with outpatient resources and did not have indications for inpatient psychiatric care at that time.  Patient presents to the ED requesting detox from heroin and subsequent inpatient psychiatric care for suicidality.  Patient denies any plan for suicide and denies recent attempts.  Reports last using heroin last night intranasally, with congestion of marijuana.  Reports hopelessness in the setting of breaking up with his fiance and reports motivation to get help.  Denies recent illnesses or fevers.  Patient is eating a sandwich when I first talked to him and he denies any postprandial abdominal pain, nausea or vomiting.    Past Medical History:  Diagnosis Date  . Anxiety   . Attempted suicide (HCC)   . Chronic back pain   . Chronic knee pain   . Depression   . Insomnia   . PTSD (post-traumatic stress disorder)   . Substance abuse [facility])     Patient Active Problem List   Diagnosis Date Noted  . Opioid withdrawal (HCC) 07/08/2016  . Opioid dependence (HCC) 10/18/2012  . Opioid use with withdrawal (HCC) 10/18/2012    History reviewed. No pertinent surgical history.  Prior to Admission medications   Medication Sig Start Date End Date Taking? Authorizing Provider  ibuprofen (ADVIL) 200 MG tablet Take 200 mg by mouth every 6 (six) hours as needed.    [provider]  SUMAtriptan (IMITREX)  50 MG tablet May repeat in 2 hours if headache persists or recurs.  Do not exceed more than 4 tablets in 24 hours Patient not taking: Reported on 11/25/2015 03/13/15 12/26/15  Burgess Amor, PA-C    Allergies Poison ivy extract [poison ivy extract]  Family History  Problem Relation Age of Onset  . Diabetes Mother   . Cancer Mother   . Hypertension Mother   . Hypertension Father     Social History Social History   Tobacco Use  . Smoking status: Current Every Day Smoker    Packs/day: 1.00    Years: 7.00    Pack years: 7.00    Types: Cigarettes  . Smokeless tobacco: Never Used  Substance Use Topics  . Alcohol use: Yes    Comment: occasionally   . Drug use: Yes    Frequency: 5.0 times per week    Types: Hydrocodone, Oxycodone, Benzodiazepines, Marijuana    Comment: marijuana last used 07/06/16, heroin last used 07/07/16    Review of Systems  Constitutional: No fever/chills Eyes: No visual changes. ENT: No sore throat. Cardiovascular: Denies chest pain. Respiratory: Denies shortness of breath. Gastrointestinal: No abdominal pain.  No nausea, no vomiting.  No diarrhea.  No constipation. Genitourinary: Negative for dysuria. Musculoskeletal: Negative for back pain. Skin: Negative for rash. Neurological: Negative for headaches, focal weakness or numbness. Psychiatric: Positive for suicidality.  Negative for homicidality or hallucinations.  ____________________________________________   PHYSICAL EXAM:  VITAL SIGNS: Vitals:   05/27/20 1616  BP: 139/84  Pulse: 97  Resp: 18  Temp:  99.6 F (37.6 C)  SpO2: 96%      Constitutional: Alert and oriented. Well appearing and in no acute distress.  Conversational in full sentences. Eyes: Conjunctivae are normal. PERRL. EOMI. Head: Atraumatic. Nose: No congestion/rhinnorhea. Mouth/Throat: Mucous membranes are moist.  Oropharynx non-erythematous. Neck: No stridor. No cervical spine tenderness to palpation. Cardiovascular:  Normal rate, regular rhythm. Grossly normal heart sounds.  Good peripheral circulation. Respiratory: Normal respiratory effort.  No retractions. Lungs CTAB. Gastrointestinal: Soft , nondistended, nontender to palpation. No abdominal bruits. No CVA tenderness. Musculoskeletal: No lower extremity tenderness nor edema.  No joint effusions. No signs of acute trauma. Neurologic:  Normal speech and language. No gross focal neurologic deficits are appreciated. No gait instability noted. Skin:  Skin is warm, dry and intact. No rash noted. Psychiatric: Mood and affect are normal. Speech and behavior are normal.  Linear thought processes.  ____________________________________________   LABS (all labs ordered are listed, but only abnormal results are displayed)  Labs Reviewed  SALICYLATE LEVEL - Abnormal; Notable for the following components:      Result Value   Salicylate Lvl <7.0 (*)    All other components within normal limits  ACETAMINOPHEN LEVEL - Abnormal; Notable for the following components:   Acetaminophen (Tylenol), Serum <10 (*)    All other components within normal limits  CBC - Abnormal; Notable for the following components:   WBC 10.7 (*)    All other components within normal limits  RESPIRATORY PANEL BY RT PCR (FLU A&B, COVID)  COMPREHENSIVE METABOLIC PANEL  ETHANOL  URINE DRUG SCREEN, QUALITATIVE ([facility] ONLY)   ___________________________________________   PROCEDURES and INTERVENTIONS  Procedure(s) performed (including Critical Care):  Procedures  Medications - No data to display  ____________________________________________   MDM / ED COURSE  34 year old with history of polysubstance abuse presents to the ED with suicidality without a plan requiring IVC and psychiatric consultation.  No evidence of medical pathology to preclude psychiatric consultation for disposition.  Patient placed under IVC due to suicidality.  Awaiting psychiatric consultation for  recommendations.  Clinical Course as of May 27 1720  Tue May 27, 2020  1711 The patient has been placed in psychiatric observation due to the need to provide a safe environment for the patient while obtaining psychiatric consultation and evaluation, as well as ongoing medical and medication management to treat the patient's condition. The patient has been placed under full IVC at this time.     [DS]    Clinical Course User Index [DS] Delton Prairie, MD     ____________________________________________   FINAL CLINICAL IMPRESSION(S) / ED DIAGNOSES  Final diagnoses:  Suicidal ideation  Polysubstance abuse (HCC)  Heroin abuse [facility])     ED Discharge Orders    None       Essica Kiker   Note:  This document was prepared using Dragon voice recognition software and may include unintentional dictation errors.   Delton Prairie, MD 05/27/20 [phone]

## 2020-05-27 NOTE — ED Notes (Signed)
Pt given book out of his belongings bag per his request.

## 2020-05-27 NOTE — BH Assessment (Signed)
This Clinical research associate contacted [facility] and spoke with Susie. Writer updated Susie to report that patient does not have private insurance and will fax an updated face sheet to [facility]. [facility] Susie reported to contact back tomorrow 05/28/20 around 11am-12pm to follow up with bed availability.   Updated information was faxed at 05/27/20 8:40pm

## 2020-05-27 NOTE — ED Notes (Signed)
Gave pt a food tray with juice. 

## 2020-05-27 NOTE — Consult Note (Signed)
[facility] Face-to-Face Psychiatry Consult   Reason for Consult: Patient consult for this 34 year old man with a history of polysubstance abuse who is requesting detox and residential treatment Referring Physician: Katrinka Blazing Patient Identification: Ryan Kelley MRN:  [id number] Principal Diagnosis: Opioid use with withdrawal [facility]) Diagnosis:  Principal Problem:   Opioid use with withdrawal (HCC) Active Problems:   Opioid dependence (HCC)   Benzodiazepine abuse (HCC)   Stimulant abuse (HCC)   Total Time spent with patient: 1 hour  Subjective:   Ryan Kelley is a 34 y.o. male patient admitted with "I really need to get detoxed and get some treatment".  HPI: Patient seen chart reviewed.  This is a 34 year old man with a significant history of substance abuse and frequent visits to emergency rooms who comes in tonight requesting treatment for opiate abuse benzodiazepine abuse and stimulant abuse.  He states that he is using heroin mostly by snorting it and last had some last night.  Uses clonazepam as his main benzodiazepine estimates about 5 mg a day and is using Ritalin as well.  Patient's mood is anxious and dysphoric.  Sleep has been poor.  Self-care has been poor.  Patient says he had had some very very vague suicidal thoughts but without any plan or intention and now has no suicidal thoughts at all but instead is focused on recovery.  No evidence of any psychotic symptoms.  Not engaging in any kind of outpatient treatment  Past Psychiatric History: Past history of 1 admission to [facility] for detox after which he did not follow-up with outpatient treatment.  Multiple other visits to ER is under similar circumstances after which she has not followed up.  Possible PTSD in the past.  Has been treated with citalopram previously without lasting result.  Risk to Self:   Risk to Others:   Prior Inpatient Therapy:   Prior Outpatient Therapy:    Past Medical History:  Past Medical History:  Diagnosis  Date  . Anxiety   . Attempted suicide (HCC)   . Chronic back pain   . Chronic knee pain   . Depression   . Insomnia   . PTSD (post-traumatic stress disorder)   . Substance abuse (HCC)    History reviewed. No pertinent surgical history. Family History:  Family History  Problem Relation Age of Onset  . Diabetes Mother   . Cancer Mother   . Hypertension Mother   . Hypertension Father    Family Psychiatric  History: Reports that his mother had anxiety Social History:  Social History   Substance and Sexual Activity  Alcohol Use Yes   Comment: occasionally      Social History   Substance and Sexual Activity  Drug Use Yes  . Frequency: 5.0 times per week  . Types: Hydrocodone, Oxycodone, Benzodiazepines, Marijuana   Comment: marijuana last used 07/06/16, heroin last used 07/07/16    Social History   Socioeconomic History  . Marital status: Single    Spouse name: Not on file  . Number of children: Not on file  . Years of education: Not on file  . Highest education level: Not on file  Occupational History  . Not on file  Tobacco Use  . Smoking status: Current Every Day Smoker    Packs/day: 1.00    Years: 7.00    Pack years: 7.00    Types: Cigarettes  . Smokeless tobacco: Never Used  Substance and Sexual Activity  . Alcohol use: Yes    Comment: occasionally   .  Drug use: Yes    Frequency: 5.0 times per week    Types: Hydrocodone, Oxycodone, Benzodiazepines, Marijuana    Comment: marijuana last used 07/06/16, heroin last used 07/07/16  . Sexual activity: Not on file  Other Topics Concern  . Not on file  Social History Narrative   ** Merged History Encounter **       Social Determinants of Health   Financial Resource Strain:   . Difficulty of Paying Living Expenses: Not on file  Food Insecurity:   . Worried About Programme researcher, broadcasting/film/video in the Last Year: Not on file  . Ran Out of Food in the Last Year: Not on file  Transportation Needs:   . Lack of  Transportation (Medical): Not on file  . Lack of Transportation (Non-Medical): Not on file  Physical Activity:   . Days of Exercise per Week: Not on file  . Minutes of Exercise per Session: Not on file  Stress:   . Feeling of Stress : Not on file  Social Connections:   . Frequency of Communication with Friends and Family: Not on file  . Frequency of Social Gatherings with Friends and Family: Not on file  . Attends Religious Services: Not on file  . Active Member of Clubs or Organizations: Not on file  . Attends Banker Meetings: Not on file  . Marital Status: Not on file   Additional Social History:    Allergies:   Allergies  Allergen Reactions  . Poison Ivy Extract [Poison Ivy Extract]     Labs:  Results for orders placed or performed during the hospital encounter of 05/27/20 (from the past 48 hour(s))  Comprehensive metabolic panel     Status: None   Collection Time: 05/27/20  4:22 PM  Result Value Ref Range   Sodium 138 135 - 145 mmol/L   Potassium 3.9 3.5 - 5.1 mmol/L   Chloride 100 98 - 111 mmol/L   CO2 29 22 - 32 mmol/L   Glucose, Bld 91 70 - 99 mg/dL    Comment: Glucose reference range applies only to samples taken after fasting for at least 8 hours.   BUN 13 6 - 20 mg/dL   Creatinine, Ser 1.61 0.61 - 1.24 mg/dL   Calcium 9.3 8.9 - 09.6 mg/dL   Total Protein 8.1 6.5 - 8.1 g/dL   Albumin 4.8 3.5 - 5.0 g/dL   AST 24 15 - 41 U/L   ALT 22 0 - 44 U/L   Alkaline Phosphatase 79 38 - 126 U/L   Total Bilirubin 0.6 0.3 - 1.2 mg/dL   GFR, Estimated >04 >54 mL/min   Anion gap 9 5 - 15    Comment: Performed at [facility], [address] [place], [place] [address]  Ethanol     Status: None   Collection Time: 05/27/20  4:22 PM  Result Value Ref Range   Alcohol, Ethyl (B) <10 <10 mg/dL    Comment: (NOTE) Lowest detectable limit for serum alcohol is 10 mg/dL.  For medical purposes only. Performed at [facility], [address]  [place], [place] [address]   Salicylate level     Status: Abnormal   Collection Time: 05/27/20  4:22 PM  Result Value Ref Range   Salicylate Lvl <7.0 (L) 7.0 - 30.0 mg/dL    Comment: Performed at [facility], [address] [place], [place] [address]  Acetaminophen level     Status: Abnormal   Collection Time: 05/27/20  4:22 PM  Result Value Ref Range   Acetaminophen (Tylenol), Serum <10 (L) 10 - 30 ug/mL    Comment: (NOTE) Therapeutic concentrations vary significantly. A range of 10-30 ug/mL  may be an effective concentration for many patients. However, some  are best treated at concentrations outside of this range. Acetaminophen concentrations >150 ug/mL at 4 hours after ingestion  and >50 ug/mL at 12 hours after ingestion are often associated with  toxic reactions.  Performed at [facility], [address] [place], [place] [address]   cbc     Status: Abnormal   Collection Time: 05/27/20  4:22 PM  Result Value Ref Range   WBC 10.7 (H) 4.0 - 10.5 K/uL   RBC 5.41 4.22 - 5.81 MIL/uL   Hemoglobin 16.8 13.0 - 17.0 g/dL   HCT 84.6 39 - 52 %   MCV 88.0 80.0 - 100.0 fL   MCH 31.1 26.0 - 34.0 pg   MCHC 35.3 30.0 - 36.0 g/dL   RDW 96.2 95.2 - 84.1 %   Platelets 320 150 - 400 K/uL   nRBC 0.0 0.0 - 0.2 %    Comment: Performed at [facility], [address], [place], [place] [address]  Urine Drug Screen, Qualitative     Status: Abnormal   Collection Time: 05/27/20  4:22 PM  Result Value Ref Range   Tricyclic, Ur Screen POSITIVE (A) NONE DETECTED   Amphetamines, Ur Screen NONE DETECTED NONE DETECTED   MDMA (Ecstasy)Ur Screen NONE DETECTED NONE DETECTED   Cocaine Metabolite,Ur [place] NONE DETECTED NONE DETECTED   Opiate, Ur Screen POSITIVE (A) NONE DETECTED   Phencyclidine (PCP) Ur S NONE DETECTED NONE DETECTED   Cannabinoid 50 Ng, Ur [place] POSITIVE (A) NONE DETECTED   Barbiturates, Ur Screen NONE DETECTED NONE DETECTED   Benzodiazepine, Ur Scrn POSITIVE (A)  NONE DETECTED   Methadone Scn, Ur POSITIVE (A) NONE DETECTED    Comment: (NOTE) Tricyclics + metabolites, urine    Cutoff 1000 ng/mL Amphetamines + metabolites, urine  Cutoff 1000 ng/mL MDMA (Ecstasy), urine              Cutoff 500 ng/mL Cocaine Metabolite, urine          Cutoff 300 ng/mL Opiate + metabolites, urine        Cutoff 300 ng/mL Phencyclidine (PCP), urine         Cutoff 25 ng/mL Cannabinoid, urine                 Cutoff 50 ng/mL Barbiturates + metabolites, urine  Cutoff 200 ng/mL Benzodiazepine, urine              Cutoff 200 ng/mL Methadone, urine                   Cutoff 300 ng/mL  The urine drug screen provides only a preliminary, unconfirmed analytical test result and should not be used for non-medical purposes. Clinical consideration and professional judgment should be applied to any positive drug screen result due to possible interfering substances. A more specific alternate chemical method must be used in order to obtain a confirmed analytical result. Gas chromatography / mass spectrometry (GC/MS) is the preferred confirm atory method. Performed at [facility], [address] [place], [place] [address]     No current facility-administered medications for this encounter.   Current Outpatient Medications  Medication Sig Dispense Refill  . ibuprofen (ADVIL) 200 MG tablet Take 200 mg by mouth every 6 (six)  hours as needed.      Musculoskeletal: Strength & Muscle Tone: within normal limits Gait & Station: normal Patient leans: N/A  Psychiatric Specialty Exam: Physical Exam Vitals and nursing note reviewed.  Constitutional:      Appearance: He is well-developed.  HENT:     Head: Normocephalic and atraumatic.  Eyes:     Conjunctiva/sclera: Conjunctivae normal.     Pupils: Pupils are equal, round, and reactive to light.  Cardiovascular:     Heart sounds: Normal heart sounds.  Pulmonary:     Effort: Pulmonary effort is normal.  Abdominal:      Palpations: Abdomen is soft.  Musculoskeletal:        General: Normal range of motion.     Cervical back: Normal range of motion.  Skin:    General: Skin is warm and dry.  Neurological:     General: No focal deficit present.     Mental Status: He is alert.  Psychiatric:        Attention and Perception: Attention normal.        Mood and Affect: Mood is anxious.        Speech: Speech normal.        Behavior: Behavior is cooperative.        Thought Content: Thought content is not paranoid. Thought content does not include homicidal or suicidal ideation.        Cognition and Memory: Cognition normal.        Judgment: Judgment normal.     Review of Systems  Constitutional: Positive for fatigue.  HENT: Negative.   Eyes: Negative.   Respiratory: Negative.   Cardiovascular: Negative.   Gastrointestinal: Negative.   Musculoskeletal: Negative.   Skin: Negative.   Neurological: Negative.   Psychiatric/Behavioral: Positive for dysphoric mood. Negative for suicidal ideas.    Blood pressure 139/84, pulse 97, temperature 99.6 F (37.6 C), temperature source Oral, resp. rate 18, height 5\' 5"  (1.651 m), weight 99.8 kg, SpO2 96 %.Body mass index is 36.61 kg/m.  General Appearance: Casual  Eye Contact:  Good  Speech:  Clear and Coherent  Volume:  Normal  Mood:  Dysphoric  Affect:  Constricted  Thought Process:  Coherent  Orientation:  Full (Time, Place, and Person)  Thought Content:  Logical  Suicidal Thoughts:  No  Homicidal Thoughts:  No  Memory:  Immediate;   Fair Recent;   Fair Remote;   Fair  Judgement:  Fair  Insight:  Fair  Psychomotor Activity:  Decreased  Concentration:  Concentration: Poor  Recall:  [employer]is[employer]Fair  Fund of Knowledge:  Fair  Language:  Fair  Akathisia:  No  Handed:  Right  AIMS (if indicated):     Assets:  Desire for Improvement  ADL's:  Intact  Cognition:  WNL  Sleep:        Treatment Plan Summary: Daily contact with patient to assess and evaluate  symptoms and progress in treatment, Medication management and Plan Patient with polysubstance abuse who keeps coming in and out of emergency rooms asking for treatment and then not following up.  I have clarified with the patient that he is not actually having any suicidal thoughts.  I do not think he meets commitment criteria.  On the other hand I do think that of course he would benefit from substance abuse treatment.  I have suggested to the treatment team that we refer him to residential treatment services.  Patient will remain voluntary for now.  He can have medication  as needed for comfort.  [facility] consult will be made as soon as possible.  Disposition: Supportive therapy provided about ongoing stressors. Discussed crisis plan, support from social network, calling 911, coming to the Emergency Department, and calling Suicide Hotline.  Mordecai Rasmussen, MD 05/27/2020 5:29 PM

## 2020-05-27 NOTE — ED Notes (Addendum)
1 pair nike slides, 1 pair black socks, 1 pair basketball shorts, 1 t shirt, 1 orange neck gaiter, 1 pair boxers, 1 light blue hoodie, 1 book, cigarettes, lighter, change purse.   Pt dressed into hospital scrubs by this RN and Jeb, EDT.

## 2020-05-28 ENCOUNTER — Emergency Department (HOSPITAL_COMMUNITY)
Admission: EM | Admit: 2020-05-28 | Discharge: 2020-05-29 | Disposition: A | Discharge status: Home / Self Care | Payer: Self-pay | Attending: Emergency Medicine | Admitting: Emergency Medicine

## 2020-05-28 ENCOUNTER — Encounter (HOSPITAL_COMMUNITY): Payer: Self-pay | Admitting: *Deleted

## 2020-05-28 ENCOUNTER — Other Ambulatory Visit: Payer: Self-pay

## 2020-05-28 DIAGNOSIS — M791 Myalgia, unspecified site: Secondary | ICD-10-CM | POA: Insufficient documentation

## 2020-05-28 DIAGNOSIS — F17210 Nicotine dependence, cigarettes, uncomplicated: Secondary | ICD-10-CM | POA: Insufficient documentation

## 2020-05-28 DIAGNOSIS — F1113 Opioid abuse with withdrawal: Secondary | ICD-10-CM | POA: Insufficient documentation

## 2020-05-28 DIAGNOSIS — R45851 Suicidal ideations: Secondary | ICD-10-CM | POA: Insufficient documentation

## 2020-05-28 DIAGNOSIS — R112 Nausea with vomiting, unspecified: Secondary | ICD-10-CM | POA: Insufficient documentation

## 2020-05-28 DIAGNOSIS — F13139 Sedative, hypnotic or anxiolytic abuse with withdrawal, unspecified: Secondary | ICD-10-CM | POA: Insufficient documentation

## 2020-05-28 DIAGNOSIS — F1290 Cannabis use, unspecified, uncomplicated: Secondary | ICD-10-CM | POA: Insufficient documentation

## 2020-05-28 DIAGNOSIS — D72829 Elevated white blood cell count, unspecified: Secondary | ICD-10-CM | POA: Insufficient documentation

## 2020-05-28 DIAGNOSIS — F1910 Other psychoactive substance abuse, uncomplicated: Secondary | ICD-10-CM

## 2020-05-28 LAB — RAPID URINE DRUG SCREEN, HOSP PERFORMED
Amphetamines: NOT DETECTED
Barbiturates: NOT DETECTED
Benzodiazepines: POSITIVE — AB
Cocaine: NOT DETECTED
Opiates: NOT DETECTED
Tetrahydrocannabinol: POSITIVE — AB

## 2020-05-28 LAB — COMPREHENSIVE METABOLIC PANEL
ALT: 22 U/L (ref 0–44)
AST: 23 U/L (ref 15–41)
Albumin: 4.7 g/dL (ref 3.5–5.0)
Alkaline Phosphatase: 77 U/L (ref 38–126)
Anion gap: 11 (ref 5–15)
BUN: 13 mg/dL (ref 6–20)
CO2: 22 mmol/L (ref 22–32)
Calcium: 9.5 mg/dL (ref 8.9–10.3)
Chloride: 102 mmol/L (ref 98–111)
Creatinine, Ser: 0.77 mg/dL (ref 0.61–1.24)
GFR, Estimated: >60 mL/min (ref >60)
Glucose, Bld: 87 mg/dL (ref 70–99)
Potassium: 3.7 mmol/L (ref 3.5–5.1)
Sodium: 135 mmol/L (ref 135–145)
Total Bilirubin: 0.8 mg/dL (ref 0.3–1.2)
Total Protein: 7.9 g/dL (ref 6.5–8.1)

## 2020-05-28 LAB — CBC WITH DIFFERENTIAL/PLATELET
Abs Immature Granulocytes: 0.12 10*3/uL — ABNORMAL HIGH (ref 0.00–0.07)
Basophils Absolute: 0.1 10*3/uL (ref 0.0–0.1)
Basophils Relative: 0 %
Eosinophils Absolute: 0.1 10*3/uL (ref 0.0–0.5)
Eosinophils Relative: 0 %
HCT: 48.5 % (ref 39.0–52.0)
Hemoglobin: 16.8 g/dL (ref 13.0–17.0)
Immature Granulocytes: 1 %
Lymphocytes Relative: 13 %
Lymphs Abs: 2.8 10*3/uL (ref 0.7–4.0)
MCH: 31.1 pg (ref 26.0–34.0)
MCHC: 34.6 g/dL (ref 30.0–36.0)
MCV: 89.8 fL (ref 80.0–100.0)
Monocytes Absolute: 0.9 10*3/uL (ref 0.1–1.0)
Monocytes Relative: 4 %
Neutro Abs: 17.4 10*3/uL — ABNORMAL HIGH (ref 1.7–7.7)
Neutrophils Relative %: 82 %
Platelets: 353 10*3/uL (ref 150–400)
RBC: 5.4 MIL/uL (ref 4.22–5.81)
RDW: 12.4 % (ref 11.5–15.5)
WBC: 21.4 10*3/uL — ABNORMAL HIGH (ref 4.0–10.5)
nRBC: 0 % (ref 0.0–0.2)

## 2020-05-28 LAB — RESPIRATORY PANEL BY RT PCR (FLU A&B, COVID)
Influenza A by PCR: NEGATIVE
Influenza B by PCR: NEGATIVE
SARS Coronavirus 2 by RT PCR: NEGATIVE

## 2020-05-28 LAB — ETHANOL: Alcohol, Ethyl (B): <10 mg/dL (ref <10)

## 2020-05-28 MED ORDER — CHLORDIAZEPOXIDE HCL 25 MG PO CAPS
25.0000 mg | ORAL_CAPSULE | ORAL | Status: AC
  Administered 2020-05-28: 25 mg via ORAL
  Filled 2020-05-28: qty 1

## 2020-05-28 MED ORDER — TRAZODONE HCL 50 MG PO TABS
100.0000 mg | ORAL_TABLET | Freq: Every day | ORAL | Status: DC
  Administered 2020-05-29: 100 mg via ORAL
  Filled 2020-05-28: qty 2

## 2020-05-28 MED ORDER — CITALOPRAM HYDROBROMIDE 20 MG PO TABS
20.0000 mg | ORAL_TABLET | Freq: Every day | ORAL | Status: DC
  Filled 2020-05-28 (×2): qty 1

## 2020-05-28 MED ORDER — ACETAMINOPHEN 500 MG PO TABS: 1000.0000 mg | ORAL_TABLET | Freq: Once | ORAL | Status: DC

## 2020-05-28 MED ORDER — ONDANSETRON 8 MG PO TBDP: 8.0000 mg | ORAL_TABLET | Freq: Once | ORAL | Status: DC

## 2020-05-28 NOTE — ED Triage Notes (Signed)
States he is here for help getting off heroin  and benzos and marijuana

## 2020-05-28 NOTE — ED Notes (Signed)
Patient states that he is not trying to hurt hisself or others. Patient dressed out in purple scrubs. Urine sample was given, put on counter in room. Belongings were put in locker. Security wanded patient in triage and as well as in room after getting dressed out.

## 2020-05-28 NOTE — ED Notes (Signed)
Psychiatry at bedside.

## 2020-05-28 NOTE — ED Notes (Signed)
Pt denies any SI at this time. Pt calm and cooperative. Denies further needs at this time.

## 2020-05-28 NOTE — ED Provider Notes (Signed)
[facility] EMERGENCY DEPARTMENT Provider Note   CSN: [id number] Arrival date & time: 05/28/20  1742     History Chief Complaint  Patient presents with  . Medical Clearance    Ryan Kelley is a 34 y.o. male with PMHx anxiety, depression, polysubstance abuse who presents to the ED today initially requesting detox from heroine, benzos, and marijuana. Pt reports that he last used heroine 2 days ago - he snorts it. He states he last used benzos today. Pt initially denies SI however when informed that we do not do detox here he states "well now I am suicidal." Pt states he is having nausea, vomiting, and body aches.   Per chart review: Pt just released from [facility] today after presenting for suicidal ideation. It appears he goes to [place] quite often for same. It appears that he was discharged with recommendation for transfer to [facility] for detox and further treatment assessment. Does not appear pt went today.    The history is provided by the patient and medical records.       Past Medical History:  Diagnosis Date  . Anxiety   . Attempted suicide (HCC)   . Chronic back pain   . Chronic knee pain   . Depression   . Insomnia   . PTSD (post-traumatic stress disorder)   . Substance abuse [facility])     Patient Active Problem List   Diagnosis Date Noted  . Benzodiazepine abuse (HCC) 05/27/2020  . 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 Opioid withdrawal (HCC) 07/08/2016  . Opioid dependence (HCC) 10/18/2012  . Opioid use with withdrawal (HCC) 10/18/2012    History reviewed. No pertinent surgical history.     Family History  Problem Relation Age of Onset  . Diabetes Mother   . Cancer Mother   . Hypertension Mother   . Hypertension Father     Social History   Tobacco Use  . Smoking status: Current Every Day Smoker    Packs/day: 1.00    Years: 7.00    Pack years: 7.00    Types: Cigarettes  . Smokeless tobacco: Never Used  Substance Use Topics  . Alcohol use:  Yes    Comment: occasionally   . Drug use: Yes    Frequency: 5.0 times per week    Types: Hydrocodone, Oxycodone, Benzodiazepines, Marijuana    Comment: marijuana last used 07/06/16, heroin last used 07/07/16    Home Medications Prior to Admission medications   Medication Sig Start Date End Date Taking? Authorizing Provider  citalopram (CELEXA) 20 MG tablet Take 20 mg by mouth daily. 02/12/20  Yes [provider]  traZODone (DESYREL) 100 MG tablet Take 100 mg by mouth at bedtime. 04/10/20  Yes [provider]  SUMAtriptan (IMITREX) 50 MG tablet May repeat in 2 hours if headache persists or recurs.  Do not exceed more than 4 tablets in 24 hours Patient not taking: Reported on 11/25/2015 03/13/15 12/26/15  Burgess Amor, PA-C    Allergies    Poison ivy extract [poison ivy extract]  Review of Systems   Review of Systems  Constitutional: Negative for chills and fever.  Gastrointestinal: Positive for nausea and vomiting.  Musculoskeletal: Positive for myalgias.  Psychiatric/Behavioral: Positive for suicidal ideas. Negative for hallucinations.  All other systems reviewed and are negative.   Physical Exam Updated Vital Signs BP (!) 167/91 (BP Location: Right Arm)   Pulse 97   Temp 98 F (36.7 C) (Oral)   Resp 20   Wt 91.6  kg   SpO2 100%   BMI 33.61 kg/m   Physical Exam Vitals and nursing note reviewed.  Constitutional:      Appearance: He is not ill-appearing.  HENT:     Head: Normocephalic and atraumatic.  Eyes:     Conjunctiva/sclera: Conjunctivae normal.  Cardiovascular:     Rate and Rhythm: Normal rate and regular rhythm.     Pulses: Normal pulses.  Pulmonary:     Effort: Pulmonary effort is normal.     Breath sounds: Normal breath sounds. No wheezing, rhonchi or rales.  Abdominal:     Tenderness: There is no abdominal tenderness. There is no guarding or rebound.  Skin:    General: Skin is warm and dry.     Coloration: Skin is not jaundiced.    Neurological:     Mental Status: He is alert.     ED Results / Procedures / Treatments   Labs (all labs ordered are listed, but only abnormal results are displayed) Labs Reviewed  CBC WITH DIFFERENTIAL/PLATELET - Abnormal; Notable for the following components:      Result Value   WBC 21.4 (*)    Neutro Abs 17.4 (*)    Abs Immature Granulocytes 0.12 (*)    All other components within normal limits  RESPIRATORY PANEL BY RT PCR (FLU A&B, COVID)  COMPREHENSIVE METABOLIC PANEL  ETHANOL  RAPID URINE DRUG SCREEN, HOSP PERFORMED    EKG None  Radiology No results found.  Procedures Procedures (including critical care time)  Medications Ordered in ED Medications  ondansetron (ZOFRAN-ODT) disintegrating tablet 8 mg (has no administration in time range)  acetaminophen (TYLENOL) tablet 1,000 mg (has no administration in time range)    ED Course  I have reviewed the triage vital signs and the nursing notes.  Pertinent labs & imaging results that were available during my care of the patient were reviewed by me and considered in my medical decision making (see chart for details).    MDM Rules/Calculators/A&P                          34 year old male who presents to the ED initially requesting detox from heroine, benzos, and marijuana. Initially pt denied SI. He reported nausea, vomiting, and body aches. He last used 2 days ago. On arrival vitals are stable. Pt afebrile, nontachycardic, and nontachypneic. He appears to be in NAD. Blood pressure 148/87. When told that we do not do inpatient detox at this facility pt quickly states "now I am suicidal. " He is currently voluntary. Suspect secondary gain however will obtain labs and have pt be evaluated by [facility]. Have placed peer support consult as well. Pt is here voluntarily.   CBC with elevated leukocytosis 21.4. Vitals do not suggest infectious etiology. Abdomen soft. Suspect related to withdrawals.  CMP without electrolyte  abnormalities.  EtOH negative.   EKG without Qt prolongation. Zofran and Tylenol ordered. Pt is medically cleared currently. Will await [facility] eval.   This note was prepared using Dragon voice recognition software and may include unintentional dictation errors due to the inherent limitations of voice recognition software.  Final Clinical Impression(s) / ED Diagnoses Final diagnoses:  None    Rx / DC Orders ED Discharge Orders    None       Tanda Rockers, PA-C 05/28/20 2132    Bethann Berkshire, MD 05/30/20 [phone]

## 2020-05-28 NOTE — BH Assessment (Signed)
Comprehensive Clinical Assessment (CCA) Screening, Triage and Referral Note  05/29/2020 Ryan Kelley [id number]   Ryan Kelley is a 34 year old male presenting voluntary to [facility] due to SI and requesting heroin detox. Patient reported using 5-10 benzos daily and started at the age of 54. Patient also reported using 1 gram of heroin daily and starting at the age of 69. Patient was assessed on 05/23/20 and accepted to [facility] on 05/[facility]/20. When asked why didn't patient follow through with [facility], patient stated "I was doing it for someone else and now I want to do it for myself". Patient reported SI with no plan or intent. Patient denied psychosis and HI. Patient reported worsening depressive symptoms. When asked what has changed since [facility] assessment on 05/23/2020 and since being discharged today, 05/28/20, patient replied "nothing". Patient reported last psych inpatient treatment was in 2017. Patient denied prior suicide attempts and self-harming behaviors. Patient reported no sleep and poor appetite.   Patient currently resides with mother. Patient is not employed. Patient denied access to guns. Patient was cooperative during assessment, however patient was not forthcoming with information. Patient denied collateral contact.  Disposition: Nira Conn, NP, psych cleared. Patient discharged earlier today 05/29/20 approx 1:15pm. [facility] was [facility] and [facility] staff [facility] to [facility]. Patient did not follow through with admission. Patient is being referred back to [facility].   Visit Diagnosis: Opioid Dependence  Patient Reported Information How did you hear about [place]? Self   Referral name: No data recorded  Referral phone number: No data recorded Whom do you see for routine medical problems? I don't have a doctor   Practice/Facility Name: No data recorded  Practice/Facility Phone Number: No data recorded  Name of Contact: No data recorded  Contact Number: No data  recorded  Contact Fax Number: No data recorded  Prescriber Name: No data recorded  Prescriber Address (if known): No data recorded What Is the Reason for Your Visit/Call Today? Heroin detox and SI with no plan  How Long Has This Been Causing You Problems? <Week  Have You Recently Been in Any Inpatient Treatment (Hospital/Detox/Crisis Center/28-Day Program)? Yes   Name/Location of Program/Hospital:No data recorded  How Long Were You There? No data recorded  When Were You Discharged? No data recorded Have You Ever Received Services From [facility] Before? No   Who Do You See at [facility]? No data recorded Have You Recently Had Any Thoughts About Hurting Yourself? Yes   Are You Planning to Commit Suicide/Harm Yourself At This time?  No  Have you Recently Had Thoughts About Hurting Someone [name]? No   Explanation: No data recorded Have You Used Any Alcohol or Drugs in the Past 24 Hours? Yes   How Long Ago Did You Use Drugs or Alcohol?  1755   What Did You Use and How Much? heroin  What Do You Feel Would Help You the Most Today? Other (Comment) (detox)  Do You Currently Have a Therapist/Psychiatrist? No   Name of Therapist/Psychiatrist: No data recorded  Have You Been Recently Discharged From Any Office Practice or Programs? No data recorded  Explanation of Discharge From Practice/Program:  No data recorded    CCA Screening Triage Referral Assessment Type of Contact: Tele-Assessment   Is this Initial or Reassessment? Initial Assessment   Date Telepsych consult ordered in [facility]:  05/28/20   Time Telepsych consult ordered in [facility]:  1953  Patient Reported Information Reviewed? Yes   Patient Left  Without Being Seen? No data recorded  Reason for Not Completing Assessment: No data recorded Collateral Involvement: none reported  Does Patient Have a Court Appointed Legal Guardian? No data recorded  Name and Contact of Legal Guardian:  Self  If Minor and Not Living with  Parent(s), Who has Custody? n/a  Is CPS involved or ever been involved? Never  Is APS involved or ever been involved? Never  Patient Determined To Be At Risk for Harm To Self or Others Based on Review of Patient Reported Information or Presenting Complaint? No   Method: No data recorded  Availability of Means: No data recorded  Intent: No data recorded  Notification Required: No data recorded  Additional Information for Danger to Others Potential:  No data recorded  Additional Comments for Danger to Others Potential:  No data recorded  Are There Guns or Other Weapons in Your Home?  No data recorded   Types of Guns/Weapons: No data recorded   Are These Weapons Safely Secured?                              No data recorded   Who Could Verify You Are Able To Have These Secured:    No data recorded Do You Have any Outstanding Charges, Pending Court Dates, Parole/Probation? No data recorded Contacted To Inform of Risk of Harm To Self or Others: No data recorded Location of Assessment: [facility]  Does Patient Present under Involuntary Commitment? No   IVC Papers Initial File Date: No data recorded  [place] of Residence: [place]  Patient Currently Receiving the Following Services: CD--IOP (Intensive Chemical Dependency Program);SAIOP (Substance Abuse Intensive Outpatient Program   Determination of Need: Routine (7 days)   Options For Referral: Chemical Dependency Intensive Outpatient Therapy (CDIOP)   Burnetta Sabin, [facility]

## 2020-05-28 NOTE — Consult Note (Signed)
[facility]   Reason for Consult: Follow-up report on this 34 year old man with opiate and benzodiazepine and stimulant abuse who came to the emergency room seeking help with substance abuse issues and detox Referring Physician: Rod CanBrower Patient Identification: Ryan Kelley MRN:  [id number] Principal Diagnosis: Opioid use with withdrawal [facility](HCC) Diagnosis:  Principal Problem:   Opioid use with withdrawal (HCC) Active Problems:   Opioid dependence (HCC)   Benzodiazepine abuse (HCC)   Stimulant abuse (HCC)   Total Time spent with patient: 30 minutes  Subjective:   Ryan HemanSean J Trickel is a 34 y.o. male patient admitted with "I am just a little nervous".  HPI: See note from yesterday.  34 year old man with abuse of opiates benzodiazepines and stimulants came to the emergency room with anxiety dysphoric mood and opiate withdrawal symptoms.  Requesting detox and substance abuse treatment.  Patient has not had any behavior problems.  He has denied suicidal or homicidal ideation.  There is no sign of psychosis.  He appears to be medically stable.  Subjectively anxious this morning.  Still agreeable to residential treatment services.  He confirms with us that he has no insurance coverage  Past Psychiatric History: See previous  Risk to Self:   Risk to Others:   Prior Inpatient Therapy:   Prior Outpatient Therapy:    Past Medical History:  Past Medical History:  Diagnosis Date  . Anxiety   . Attempted suicide (HCC)   . Chronic back pain   . Chronic knee pain   . Depression   . Insomnia   . PTSD (post-traumatic stress disorder)   . Substance abuse (HCC)    History reviewed. No pertinent surgical history. Family History:  Family History  Problem Relation Age of Onset  . Diabetes Mother   . Cancer Mother   . Hypertension Mother   . Hypertension Father    Family Psychiatric  History: See previous notes Social History:  Social History   Substance and Sexual Activity   Alcohol Use Yes   Comment: occasionally      Social History   Substance and Sexual Activity  Drug Use Yes  . Frequency: 5.0 times per week  . Types: Hydrocodone, Oxycodone, Benzodiazepines, Marijuana   Comment: marijuana last used 07/06/16, heroin last used 07/07/16    Social History   Socioeconomic History  . Marital status: Single    Spouse name: Not on file  . Number of children: Not on file  . Years of education: Not on file  . Highest education level: Not on file  Occupational History  . Not on file  Tobacco Use  . Smoking status: Current Every Day Smoker    Packs/day: 1.00    Years: 7.00    Pack years: 7.00    Types: Cigarettes  . Smokeless tobacco: Never Used  Substance and Sexual Activity  . Alcohol use: Yes    Comment: occasionally   . Drug use: Yes    Frequency: 5.0 times per week    Types: Hydrocodone, Oxycodone, Benzodiazepines, Marijuana    Comment: marijuana last used 07/06/16, heroin last used 07/07/16  . Sexual activity: Not on file  Other Topics Concern  . Not on file  Social History Narrative   ** Merged History Encounter **       Social Determinants of Health   Financial Resource Strain:   . Difficulty of Paying Living Expenses: Not on file  Food Insecurity:   . Worried About Programme researcher, broadcasting/film/videounning Out of Food in the  Last Year: Not on file  . Ran Out of Food in the Last Year: Not on file  Transportation Needs:   . Lack of Transportation (Medical): Not on file  . Lack of Transportation (Non-Medical): Not on file  Physical Activity:   . Days of Exercise per Week: Not on file  . Minutes of Exercise per Session: Not on file  Stress:   . Feeling of Stress : Not on file  Social Connections:   . Frequency of Communication with Friends and Family: Not on file  . Frequency of Social Gatherings with Friends and Family: Not on file  . Attends Religious Services: Not on file  . Active Member of Clubs or Organizations: Not on file  . Attends Banker  Meetings: Not on file  . Marital Status: Not on file   Additional Social History:    Allergies:   Allergies  Allergen Reactions  . Poison Ivy Extract [Poison Ivy Extract]     Labs:  Results for orders placed or performed during the hospital encounter of 05/27/20 (from the past 48 hour(s))  Comprehensive metabolic panel     Status: None   Collection Time: 05/27/20  4:22 PM  Result Value Ref Range   Sodium 138 135 - 145 mmol/L   Potassium 3.9 3.5 - 5.1 mmol/L   Chloride 100 98 - 111 mmol/L   CO2 29 22 - 32 mmol/L   Glucose, Bld 91 70 - 99 mg/dL    Comment: Glucose reference range applies only to samples taken after fasting for at least 8 hours.   BUN 13 6 - 20 mg/dL   Creatinine, Ser 6.64 0.61 - 1.24 mg/dL   Calcium 9.3 8.9 - 40.3 mg/dL   Total Protein 8.1 6.5 - 8.1 g/dL   Albumin 4.8 3.5 - 5.0 g/dL   AST 24 15 - 41 U/L   ALT 22 0 - 44 U/L   Alkaline Phosphatase 79 38 - 126 U/L   Total Bilirubin 0.6 0.3 - 1.2 mg/dL   GFR, Estimated >47 >42 mL/min   Anion gap 9 5 - 15    Comment: Performed at [facility], [address], [place], [place] [address]  Ethanol     Status: None   Collection Time: 05/27/20  4:22 PM  Result Value Ref Range   Alcohol, Ethyl (B) <10 <10 mg/dL    Comment: (NOTE) Lowest detectable limit for serum alcohol is 10 mg/dL.  For medical purposes only. Performed at [facility], [address] [place], [place] [address]   Salicylate level     Status: Abnormal   Collection Time: 05/27/20  4:22 PM  Result Value Ref Range   Salicylate Lvl <7.0 (L) 7.0 - 30.0 mg/dL    Comment: Performed at [facility], [address] [place], [place] [address]  Acetaminophen level     Status: Abnormal   Collection Time: 05/27/20  4:22 PM  Result Value Ref Range   Acetaminophen (Tylenol), Serum <10 (L) 10 - 30 ug/mL    Comment: (NOTE) Therapeutic concentrations vary significantly. A range of 10-30 ug/mL  may be an effective  concentration for many patients. However, some  are best treated at concentrations outside of this range. Acetaminophen concentrations >150 ug/mL at 4 hours after ingestion  and >50 ug/mL at 12 hours after ingestion are often associated with  toxic reactions.  Performed at [facility], [address] [place], [place] [address]   cbc  Status: Abnormal   Collection Time: 05/27/20  4:22 PM  Result Value Ref Range   WBC 10.7 (H) 4.0 - 10.5 K/uL   RBC 5.41 4.22 - 5.81 MIL/uL   Hemoglobin 16.8 13.0 - 17.0 g/dL   HCT 85.0 39 - 52 %   MCV 88.0 80.0 - 100.0 fL   MCH 31.1 26.0 - 34.0 pg   MCHC 35.3 30.0 - 36.0 g/dL   RDW 27.7 41.2 - 87.8 %   Platelets 320 150 - 400 K/uL   nRBC 0.0 0.0 - 0.2 %    Comment: Performed at [facility], [address] [place], [place] [address]  Urine Drug Screen, Qualitative     Status: Abnormal   Collection Time: 05/27/20  4:22 PM  Result Value Ref Range   Tricyclic, Ur Screen POSITIVE (A) NONE DETECTED   Amphetamines, Ur Screen NONE DETECTED NONE DETECTED   MDMA (Ecstasy)Ur Screen NONE DETECTED NONE DETECTED   Cocaine Metabolite,Ur [place] NONE DETECTED NONE DETECTED   Opiate, Ur Screen POSITIVE (A) NONE DETECTED   Phencyclidine (PCP) Ur S NONE DETECTED NONE DETECTED   Cannabinoid 50 Ng, Ur [place] POSITIVE (A) NONE DETECTED   Barbiturates, Ur Screen NONE DETECTED NONE DETECTED   Benzodiazepine, Ur Scrn POSITIVE (A) NONE DETECTED   Methadone Scn, Ur POSITIVE (A) NONE DETECTED    Comment: (NOTE) Tricyclics + metabolites, urine    Cutoff 1000 ng/mL Amphetamines + metabolites, urine  Cutoff 1000 ng/mL MDMA (Ecstasy), urine              Cutoff 500 ng/mL Cocaine Metabolite, urine          Cutoff 300 ng/mL Opiate + metabolites, urine        Cutoff 300 ng/mL Phencyclidine (PCP), urine         Cutoff 25 ng/mL Cannabinoid, urine                 Cutoff 50 ng/mL Barbiturates + metabolites, urine  Cutoff 200 ng/mL Benzodiazepine, urine               Cutoff 200 ng/mL Methadone, urine                   Cutoff 300 ng/mL  The urine drug screen provides only a preliminary, unconfirmed analytical test result and should not be used for non-medical purposes. Clinical consideration and professional judgment should be applied to any positive drug screen result due to possible interfering substances. A more specific alternate chemical method must be used in order to obtain a confirmed analytical result. Gas chromatography / mass spectrometry (GC/MS) is the preferred confirm atory method. Performed at [facility], [address] [place], [place] [address]   Respiratory Panel by RT PCR (Flu A&B, Covid) - Nasopharyngeal Swab     Status: None   Collection Time: 05/27/20  9:16 PM   Specimen: Nasopharyngeal Swab  Result Value Ref Range   SARS Coronavirus 2 by RT PCR NEGATIVE NEGATIVE    Comment: (NOTE) SARS-CoV-2 target nucleic acids are NOT DETECTED.  The SARS-CoV-2 RNA is generally detectable in upper respiratoy specimens during the acute phase of infection. The lowest concentration of SARS-CoV-2 viral copies this assay can detect is 131 copies/mL. A negative result does not preclude SARS-Cov-2 infection and should not be used as the sole basis for treatment or other patient management decisions. A negative result may occur with  improper specimen collection/handling, submission of specimen other than nasopharyngeal swab, presence of viral  mutation(s) within the areas targeted by this assay, and inadequate number of viral copies (<131 copies/mL). A negative result must be combined with clinical observations, patient history, and epidemiological information. The expected result is Negative.  Fact Sheet for Patients:  https://www.moore.com/  Fact Sheet for Healthcare Providers:  https://www.young.biz/  This test is no t yet approved or cleared by the [place] FDA and  has been  authorized for detection and/or diagnosis of SARS-CoV-2 by FDA under an Emergency Use Authorization (EUA). This EUA will remain  in effect (meaning this test can be used) for the duration of the COVID-19 declaration under Section 564(b)(1) of the Act, 21 U.S.C. section 360bbb-3(b)(1), unless the authorization is terminated or revoked sooner.     Influenza A by PCR NEGATIVE NEGATIVE   Influenza B by PCR NEGATIVE NEGATIVE    Comment: (NOTE) The Xpert Xpress SARS-CoV-2/FLU/RSV assay is intended as an aid in  the diagnosis of influenza from Nasopharyngeal swab specimens and  should not be used as a sole basis for treatment. Nasal washings and  aspirates are unacceptable for Xpert Xpress SARS-CoV-2/FLU/RSV  testing.  Fact Sheet for Patients: https://www.moore.com/  Fact Sheet for Healthcare Providers: https://www.young.biz/  This test is not yet approved or cleared by the [place] FDA and  has been authorized for detection and/or diagnosis of SARS-CoV-2 by  FDA under an Emergency Use Authorization (EUA). This EUA will remain  in effect (meaning this test can be used) for the duration of the  Covid-19 declaration under Section 564(b)(1) of the Act, 21  U.S.C. section 360bbb-3(b)(1), unless the authorization is  terminated or revoked. Performed at [facility], [address], [place], [place] [address]     Current Facility-Administered Medications  Medication Dose Route Frequency Provider Last Rate Last Admin  . chlordiazePOXIDE (LIBRIUM) capsule 25 mg  25 mg Oral STAT Lakaisha Danish, Jackquline Denmark, MD       Current Outpatient Medications  Medication Sig Dispense Refill  . ibuprofen (ADVIL) 200 MG tablet Take 200 mg by mouth every 6 (six) hours as needed.      Musculoskeletal: Strength & Muscle Tone: within normal limits Gait & Station: normal Patient leans: N/A  Psychiatric Specialty Exam: Physical Exam Vitals and nursing note  reviewed.  Constitutional:      Appearance: He is well-developed.  HENT:     Head: Normocephalic and atraumatic.  Eyes:     Conjunctiva/sclera: Conjunctivae normal.     Pupils: Pupils are equal, round, and reactive to light.  Cardiovascular:     Heart sounds: Normal heart sounds.  Pulmonary:     Effort: Pulmonary effort is normal.  Abdominal:     Palpations: Abdomen is soft.  Musculoskeletal:        General: Normal range of motion.     Cervical back: Normal range of motion.  Skin:    General: Skin is warm and dry.  Neurological:     General: No focal deficit present.     Mental Status: He is alert.  Psychiatric:        Attention and Perception: Attention normal.        Mood and Affect: Mood is anxious.        Speech: Speech normal.        Behavior: Behavior is cooperative.        Thought Content: Thought content is not paranoid or delusional. Thought content does not include homicidal or suicidal ideation.        Cognition and Memory:  Cognition normal.        Judgment: Judgment normal.     Review of Systems  Constitutional: Negative.   HENT: Negative.   Eyes: Negative.   Respiratory: Negative.   Cardiovascular: Negative.   Gastrointestinal: Negative.   Musculoskeletal: Negative.   Skin: Negative.   Neurological: Negative.   Psychiatric/Behavioral: The patient is nervous/anxious.     Blood pressure 121/78, pulse 74, temperature 98.4 F (36.9 C), temperature source Oral, resp. rate 16, height  (1.651 m), weight 99.8 kg, SpO2 98 %.Body mass index is 36.61 kg/m.  General Appearance: Casual  Eye Contact:  Fair  Speech:  Clear and Coherent  Volume:  Decreased  Mood:  Anxious and Dysphoric  Affect:  Congruent  Thought Process:  Coherent  Orientation:  Full (Time, Place, and Person)  Thought Content:  Logical  Suicidal Thoughts:  No  Homicidal Thoughts:  No  Memory:  Immediate;   Fair Recent;   Fair Remote;   Fair  Judgement:  Fair  Insight:  Fair   Psychomotor Activity:  Negative  Concentration:  Concentration: Good  Recall:  Fair  Fund of Knowledge:  Fair  Language:  Fair  Akathisia:  No  Handed:  Right  AIMS (if indicated):     Assets:  Desire for Improvement Resilience  ADL's:  Intact  Cognition:  WNL  Sleep:        Treatment Plan Summary: Daily contact with patient to assess and evaluate symptoms and progress in treatment, Medication management and Plan Patient with recurrent episodes of substance abuse and withdrawal continues to have subjective symptoms of opiate and benzodiazepine withdrawal.  He is medically stable.  He is lucid and cooperative.  He does not have suicidal ideation.  He is agreeable to voluntary treatment at residential treatment services.  We will be recontacting them this morning and hope very much that he can be transferred to [facility] for further treatment and assistance.  I have ordered a single dose of Librium this morning to assist with subjective symptomatic benzodiazepine withdrawal  Disposition: See note above.  Recommend transfer to [facility] for detox and further treatment assessment  Mordecai Rasmussen, MD 05/28/2020 10:37 AM

## 2020-05-28 NOTE — ED Provider Notes (Signed)
Emergency Medicine Observation Re-evaluation Note  Ryan Kelley is a 34 y.o. male, seen on rounds today.  Pt initially presented to the ED for complaints of Suicidal Currently, the patient is resting, voices no medical complaints.  Physical Exam  BP 139/84 (BP Location: Left Arm)   Pulse 97   Temp 99.6 F (37.6 C) (Oral)   Resp 18   Ht 5\' 5"  (1.651 m)   Wt 99.8 kg   SpO2 96%   BMI 36.61 kg/m  Physical Exam General: Resting in no acute distress Cardiac: No cyanosis Lungs: Equal rise and fall Psych: Not agitated  ED Course / MDM  EKG:  Clinical Course as of May 28 626  Tue May 27, 2020  1711 The patient has been placed in psychiatric observation due to the need to provide a safe environment for the patient while obtaining psychiatric consultation and evaluation, as well as ongoing medical and medication management to treat the patient's condition. The patient has been placed under full IVC at this time.     [DS]    Clinical Course User Index [DS] 1712, MD   I have reviewed the labs performed to date as well as medications administered while in observation.  Recent changes in the last 24 hours include no changes overnight.  Plan  Current plan is for [facility] this morning. Patient is not under full IVC at this time.   Delton Prairie, MD 05/28/20 [phone]

## 2020-05-28 NOTE — BH Assessment (Signed)
Writer spoke with [facility] Molly Maduro), the patient is accepted to their facility and they will come pick him up from the ER. Writer informed the patient's nurse Shanda Bumps).

## 2020-05-28 NOTE — ED Notes (Signed)
Patient signed a printed copy of discharge instructions. [facility] employee was in the lobby waiting for the patient. Patient lit up a cigarette and smoked some of it prior to getting into [facility] car.

## 2020-05-28 NOTE — ED Notes (Addendum)
Patient's ride to [facility] is here. Patient has been given his belongings by ED tech French Ana. VS are being updated. Patient will be walked to the lobby with discharge instructions.

## 2020-05-28 NOTE — ED Notes (Signed)

## 2020-05-28 NOTE — ED Notes (Signed)
Pt given meal tray and phone 

## 2020-05-29 NOTE — ED Notes (Addendum)
Discharge instructions reviewed with pt, pt refused to sign discharge. Pt also refused to have updated VS taken prior to discharge.

## 2020-05-29 NOTE — Discharge Instructions (Addendum)
You need to follow-up with [facility] or any other program of your choice to get help with your substance abuse.  Look at the resource guide for information.

## 2020-05-29 NOTE — ED Provider Notes (Signed)
Patient has been evaluated by [facility].  He had been sent to [facility] for admission on October 20 and left AMA.  Nira Conn, nurse practitioner recommends patient follow back up with [facility].   Devoria Albe, MD 05/29/20 [phone]

## 2020-05-29 NOTE — BH Assessment (Signed)
Nira Conn, NP, psych cleared. Patient discharged earlier today 05/29/20 approx 1:15pm. Patient was accepted at [facility] and [facility] staff provided transportation from [facility] to [facility]. Patient did not follow through with admission. Patient is being referred back to [facility].

## 2020-08-18 ENCOUNTER — Ambulatory Visit (HOSPITAL_COMMUNITY)
Admission: EM | Admit: 2020-08-18 | Discharge: 2020-08-19 | Disposition: A | Discharge status: Home / Self Care | Payer: No Payment, Other | Attending: Psychiatry | Admitting: Psychiatry

## 2020-08-18 ENCOUNTER — Other Ambulatory Visit: Payer: Self-pay

## 2020-08-18 DIAGNOSIS — F1994 Other psychoactive substance use, unspecified with psychoactive substance-induced mood disorder: Secondary | ICD-10-CM

## 2020-08-18 DIAGNOSIS — Z20822 Contact with and (suspected) exposure to covid-19: Secondary | ICD-10-CM | POA: Insufficient documentation

## 2020-08-18 DIAGNOSIS — F1114 Opioid abuse with opioid-induced mood disorder: Secondary | ICD-10-CM | POA: Diagnosis not present

## 2020-08-18 DIAGNOSIS — F1310 Sedative, hypnotic or anxiolytic abuse, uncomplicated: Secondary | ICD-10-CM

## 2020-08-18 DIAGNOSIS — F1110 Opioid abuse, uncomplicated: Secondary | ICD-10-CM

## 2020-08-18 LAB — RESP PANEL BY RT-PCR (FLU A&B, COVID) ARPGX2
Influenza A by PCR: NEGATIVE
Influenza B by PCR: NEGATIVE
SARS Coronavirus 2 by RT PCR: NEGATIVE

## 2020-08-18 LAB — POCT URINE DRUG SCREEN - MANUAL ENTRY (I-SCREEN)
POC Amphetamine UR: NOT DETECTED
POC Buprenorphine (BUP): NOT DETECTED
POC Cocaine UR: NOT DETECTED
POC Marijuana UR: POSITIVE — AB
POC Methadone UR: NOT DETECTED
POC Methamphetamine UR: NOT DETECTED
POC Morphine: NOT DETECTED
POC Oxazepam (BZO): POSITIVE — AB
POC Oxycodone UR: NOT DETECTED
POC Secobarbital (BAR): NOT DETECTED

## 2020-08-18 LAB — POC SARS CORONAVIRUS 2 AG: SARS Coronavirus 2 Ag: NEGATIVE

## 2020-08-18 LAB — POC SARS CORONAVIRUS 2 AG -  ED: SARS Coronavirus 2 Ag: NEGATIVE

## 2020-08-18 MED ORDER — MAGNESIUM HYDROXIDE 400 MG/5ML PO SUSP: 30.0000 mL | Freq: Every day | ORAL | Status: DC | PRN

## 2020-08-18 MED ORDER — DICYCLOMINE HCL 10 MG PO CAPS: 10.0000 mg | ORAL_CAPSULE | Freq: Four times a day (QID) | ORAL | Status: DC | PRN

## 2020-08-18 MED ORDER — ONDANSETRON HCL 4 MG PO TABS: 4.0000 mg | ORAL_TABLET | Freq: Three times a day (TID) | ORAL | Status: DC | PRN

## 2020-08-18 MED ORDER — TRAZODONE HCL 50 MG PO TABS
50.0000 mg | ORAL_TABLET | Freq: Every evening | ORAL | Status: DC | PRN
  Administered 2020-08-18: 50 mg via ORAL
  Filled 2020-08-18: qty 1

## 2020-08-18 MED ORDER — CLONIDINE HCL 0.1 MG PO TABS: 0.1000 mg | ORAL_TABLET | ORAL | Status: DC | PRN

## 2020-08-18 MED ORDER — NICOTINE POLACRILEX 2 MG MT GUM
2.0000 mg | CHEWING_GUM | OROMUCOSAL | Status: DC | PRN
  Filled 2020-08-18: qty 30

## 2020-08-18 MED ORDER — LOPERAMIDE HCL 2 MG PO CAPS: 2.0000 mg | ORAL_CAPSULE | Freq: Four times a day (QID) | ORAL | Status: DC | PRN

## 2020-08-18 MED ORDER — CHLORDIAZEPOXIDE HCL 25 MG PO CAPS
25.0000 mg | ORAL_CAPSULE | Freq: Once | ORAL | Status: AC
  Administered 2020-08-18: 25 mg via ORAL
  Filled 2020-08-18: qty 1

## 2020-08-18 MED ORDER — HYDROXYZINE HCL 25 MG PO TABS
25.0000 mg | ORAL_TABLET | Freq: Three times a day (TID) | ORAL | Status: DC | PRN
  Administered 2020-08-18: 25 mg via ORAL
  Filled 2020-08-18: qty 1

## 2020-08-18 MED ORDER — ACETAMINOPHEN 325 MG PO TABS: 650.0000 mg | ORAL_TABLET | Freq: Four times a day (QID) | ORAL | Status: DC | PRN

## 2020-08-18 MED ORDER — ALUM & MAG HYDROXIDE-SIMETH 200-200-20 MG/5ML PO SUSP: 30.0000 mL | ORAL | Status: DC | PRN

## 2020-08-18 MED ORDER — METHOCARBAMOL 750 MG PO TABS: 750.0000 mg | ORAL_TABLET | Freq: Three times a day (TID) | ORAL | Status: DC | PRN

## 2020-08-18 NOTE — BH Assessment (Signed)
Comprehensive Clinical Assessment (CCA) Note  08/18/2020 Ryan Kelley [id number]   Ryan Kelley is a 35 year old male presenting to [facility] voluntarily with chief complaint of drug use, AVH and SI. Patient reports going to a [place] based facility named [facility] for treatment and while there he snuck in benzodiazepines. Patient reports experiencing extreme AVH and SI while at the facility, so he discharged and had his wife picked him up today.  Patient reports snorting a gram of heroin today and using benzodiazepines on Sunday. Patient reports his mother witnessing him having conversations with people who are not there and suggested that he come to [facility] for evaluation. Patient reports "I'm having an extreme mental crisis like I never experienced before". Patient reports symptoms of command auditory hallucinations telling him the only way he will get rid of the voices is if he kills himself. Patient reports the voices tell him to steal drugs, commit crimes such as break into people houses and rob people at gun point to support his drug addiction. Patient reports history of B&E but denies having access to a gun. Patient reports visual hallucinations of seeing "demons, human like, some with no torso and some with no arms and hands with horns. Patient reports going to [facility] in [place] for outpatient services about six months ago and was taking citalopram and trazadone, however patient has not had any medications in six months. Patient reports history of inpatient treatment at [facility] and other facilities. Patient reports current depressive symptoms of anhedonia, crying spells, irritation, poor sleep and appetite, feeling helpless, hopeless and worthless.  Patient is oriented to person, place and situation, he is alert, engaged and cooperative during assessment. Patient is eye contact is normal but frequently has a grimmes look on his face. Patient speech is low in tone and patient is sweating. Patient  reports SI with plan to OD or hang himself and he does not contract for safety. Patient denies history of suicide attempts but has accidently overdosed on heroin a year ago. Patient denies HI and SIB. Patient endorses command auditory hallucinations and visual hallucinations.    Patient consents for TTS to obtain collateral information from his mother [name]. Mom reports that patient has struggled with substance use for many years but over the past two months patient has been suicidal and hallucinating and she state that it is only getting worse. Mom reports that her boyfriend witnessed patient talking to people in his room that are not there and patient threatening to kill some unnamed person. Mom reports that patient will get clean for a while and then relapse. Mom is concerned about patient wellbeing and thinks he need inpatient treatment.    Disposition: Per Earlene Plater, MD, patient recommended for overnight observation.   Chief Complaint:  Chief Complaint  Patient presents with  . Suicidal  . Hallucinations  . Addiction Problem   Visit Diagnosis:  Substance induced mood disorder (HCC)  Opioid abuse, continuous (HCC)  Benzodiazepine abuse (HCC)       CCA Screening, Triage and Referral (STR)  Patient Reported Information How did you hear about [place]? Self (Phreesia 08/18/2020)  Referral name: Ermias Tomeo [facility] 08/18/2020)  Referral phone number: No data recorded  Whom do you see for routine medical problems? I don't have a doctor (Phreesia 08/18/2020)  Practice/Facility Name: No data recorded Practice/Facility Phone Number: No data recorded Name of Contact: No data recorded Contact Number: No data recorded Contact Fax Number: No data recorded Prescriber Name: No data recorded  Prescriber Address (if known): No data recorded  What Is the Reason for Your Visit/Call Today? Suicidal Thougts. Extreme Depressions- Hallucinations.  (Phreesia 08/18/2020)  How Long Has This  Been Causing You Problems? 1-6 months (Phreesia 08/18/2020)  What Do You Feel Would Help You the Most Today? Medication (Phreesia 08/18/2020)   Have You Recently Been in Any Inpatient Treatment (Hospital/Detox/Crisis Center/28-Day Program)? Yes (Phreesia 08/18/2020)  Name/Location of Program/Hospital:[facility] (Phreesia 08/18/2020)  How Long Were You There? 3 Days (Phreesia 08/18/2020)  When Were You Discharged? No data recorded  Have You Ever Received Services From [facility] Before? No (Phreesia 08/18/2020)  Who Do You See at [facility]? No data recorded  Have You Recently Had Any Thoughts About Hurting Yourself? Yes (Phreesia 08/18/2020)  Are You Planning to Commit Suicide/Harm Yourself At This time? Yes (Phreesia 08/18/2020)   Have you Recently Had Thoughts About Hurting Someone [name]? Yes (Phreesia 08/18/2020)  Explanation: No data recorded  Have You Used Any Alcohol or Drugs in the Past 24 Hours? Yes (Phreesia 08/18/2020)  How Long Ago Did You Use Drugs or Alcohol? 1755  What Did You Use and How Much? Heroin .5 Grams (Phreesia 08/18/2020)   Do You Currently Have a Therapist/Psychiatrist? No (Phreesia 08/18/2020)  Name of Therapist/Psychiatrist: No data recorded  Have You Been Recently Discharged From Any Office Practice or Programs? Yes (Phreesia 08/18/2020)  Explanation of Discharge From Practice/Program: Needed To Detox (Phreesia 08/18/2020)     CCA Screening Triage Referral Assessment Type of Contact: Face-to-Face  Is this Initial or Reassessment? Initial Assessment  Date Telepsych consult ordered in [facility]:  05/28/2020  Time Telepsych consult ordered in [facility]:  [facility]   Patient Reported Information Reviewed? Yes  Patient Left Without Being Seen? No data recorded Reason for Not Completing Assessment: No data recorded  Collateral Involvement: none reported   Does Patient Have a Court Appointed Legal Guardian? No data recorded Name  and Contact of Legal Guardian: Self  If Minor and Not Living with Parent(s), Who has Custody? n/a  Is CPS involved or ever been involved? Never  Is APS involved or ever been involved? Never   Patient Determined To Be At Risk for Harm To Self or Others Based on Review of Patient Reported Information or Presenting Complaint? Yes, for Self-Harm  Method: No data recorded Availability of Means: No data recorded Intent: No data recorded Notification Required: No data recorded Additional Information for Danger to Others Potential: No data recorded Additional Comments for Danger to Others Potential: No data recorded Are There Guns or Other Weapons in Your Home? No data recorded Types of Guns/Weapons: No data recorded Are These Weapons Safely Secured?                            No data recorded Who Could Verify You Are Able To Have These Secured: No data recorded Do You Have any Outstanding Charges, Pending Court Dates, Parole/Probation? No data recorded Contacted To Inform of Risk of Harm To Self or Others: No data recorded  Location of Assessment: [facility]   Does Patient Present under Involuntary Commitment? No  IVC Papers Initial File Date: No data recorded  [place] of Residence: [place]   Patient Currently Receiving the Following Services: CD--IOP (Intensive Chemical Dependency Program); SAIOP (Substance Abuse Intensive Outpatient Program   Determination of Need: Emergent (2 hours)   Options For Referral: Chemical Dependency Intensive Outpatient Therapy (CDIOP); Inpatient  Hospitalization; Medication Management; Outpatient Therapy     CCA Biopsychosocial Intake/Chief Complaint:  SI  Current Symptoms/Problems: Patient reports current depressive symptoms of anhedonia, crying spells, irritation, poor sleep and appetite, feeling helpless, hopeless and worthless. AVH, SI,   Patient Reported Schizophrenia/Schizoaffective Diagnosis in Past: No data  recorded  Strengths: UTA  Preferences: UTA  Abilities: UTA   Type of Services Patient Feels are Needed: No data recorded  Initial Clinical Notes/Concerns: No data recorded  Mental Health Symptoms Depression:  Hopelessness; Increase/decrease in appetite; Irritability; Sleep (too much or little); Tearfulness; Worthlessness   Duration of Depressive symptoms: Greater than two weeks   Mania:  N/A   Anxiety:   Worrying; Irritability   Psychosis:  Hallucinations   Duration of Psychotic symptoms: Less than six months   Trauma:  N/A   Obsessions:  N/A   Compulsions:  N/A   Inattention:  N/A   Hyperactivity/Impulsivity:  No data recorded  Oppositional/Defiant Behaviors:  N/A   Emotional Irregularity:  N/A   Other Mood/Personality Symptoms:  No data recorded   Mental Status Exam Appearance and self-care  Stature:  Average   Weight:  Average weight   Clothing:  Disheveled   Grooming:  Normal   Cosmetic use:  None   Posture/gait:  Tense   Motor activity:  Agitated   Sensorium  Attention:  Normal   Concentration:  Normal   Orientation:  Place; Person; Situation   Recall/memory:  Normal   Affect and Mood  Affect:  Anxious   Mood:  Anxious   Relating  Eye contact:  Normal   Facial expression:  Tense   Attitude toward examiner:  Cooperative   Thought and Language  Speech flow: Soft; Slurred   Thought content:  Appropriate to Mood and Circumstances   Preoccupation:  Suicide   Hallucinations:  Auditory; Command (Comment); Visual   Organization:  No data recorded  [employer] Services[employer]utive Functions  Fund of Knowledge:  Fair   Intelligence:  Average   Abstraction:  Normal   Judgement:  Fair   Dance movement psychotherapisteality Testing:  Adequate   Insight:  Fair   Decision Making:  Normal   Social Functioning  Social Maturity:  Impulsive   Social Judgement:  "Street Smart"   Stress  Stressors:  No data recorded  Coping Ability:  Overwhelmed; Exhausted   Skill  Deficits:  No data recorded  Supports:  Family     Religion:    Leisure/Recreation:    Exercise/Diet: Exercise/Diet Do You Have Any Trouble Sleeping?: Yes   CCA Employment/Education Employment/Work Situation: Employment / Work Situation Employment situation: Unemployed Patient's job has been impacted by current illness: No What is the longest time patient has a held a job?: 3 years Where was the patient employed at that time?: [employer] Has patient ever been in the [employer] and Companymilitary?: No  Education:     CCA Family/Childhood History Family and Relationship History: Family history Marital status: Married What types of issues is patient dealing with in the relationship?: UTA Additional relationship information: UTA What is your sexual orientation?: UTA Has your sexual activity been affected by drugs, alcohol, medication, or emotional stress?: UTA Does patient have children?: No  Childhood History:  Childhood History By whom was/is the patient raised?: Mother,Father Additional childhood history information: Pt states that he had a good childhood.  Description of patient's relationship with caregiver when they were a child: Pt reports getting along well with parents growing up.  Did patient suffer any verbal/emotional/physical/sexual abuse as a child?: No  Has patient ever been sexually abused/assaulted/raped as an adolescent or adult?: No Witnessed domestic violence?: No Has patient been affected by domestic violence as an adult?: No  Child/Adolescent Assessment:     CCA Substance Use Alcohol/Drug Use: Alcohol / Drug Use Pain Medications: See MAR Prescriptions: See MAR Over the Counter: See MAR History of alcohol / drug use?: Yes Longest period of sobriety (when/how long): unknown Negative Consequences of Use: Financial,Personal relationships Withdrawal Symptoms: Agitation,Irritability,Diarrhea,Nausea / Vomiting Substance #1 Name of Substance 1: Heroin 1 - Age of  First Use: 32 1 - Amount (size/oz): 1 gram 1 - Frequency: daily 1 - Duration: ongoing 1 - Last Use / Amount: 08/18/2020 Substance #2 Name of Substance 2: benzodiazepines 2 - Age of First Use: 10 2 - Amount (size/oz): 5-20 pills daily 2 - Frequency: daily 2 - Duration: ongoing 2 - Last Use / Amount: Sunday 08/17/2020                     ASAM's:  Six Dimensions of Multidimensional Assessment  Dimension 1:  Acute Intoxication and/or Withdrawal Potential:      Dimension 2:  Biomedical Conditions and Complications:      Dimension 3:  Emotional, Behavioral, or Cognitive Conditions and Complications:     Dimension 4:  Readiness to Change:     Dimension 5:  Relapse, Continued use, or Continued Problem Potential:     Dimension 6:  Recovery/Living Environment:     ASAM Severity Score:    ASAM Recommended Level of Treatment:     Substance use Disorder (SUD) Substance Use Disorder (SUD)  Checklist Symptoms of Substance Use: Continued use despite having a persistent/recurrent physical/psychological problem caused/exacerbated by use,Continued use despite persistent or recurrent social, interpersonal problems, caused or exacerbated by use,Evidence of withdrawal (Comment),Presence of craving or strong urge to use  Recommendations for Services/Supports/Treatments: Recommendations for Services/Supports/Treatments Recommendations For Services/Supports/Treatments: Detox  DSM5 Diagnoses: Patient Active Problem List   Diagnosis Date Noted  . Benzodiazepine abuse (HCC) 05/27/2020  . 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 Opioid withdrawal (HCC) 07/08/2016  . Opioid dependence (HCC) 10/18/2012  . Opioid use with withdrawal (HCC) 10/18/2012    Disposition: Per Earlene Plater, MD, patient recommended for overnight observation.   Janaya Broy Shirlee More, [facility]

## 2020-08-18 NOTE — ED Notes (Signed)
Pt ambulated per self on unit. Oriented to staff and unit. A&O x4. Manic but cooperative. Polite. Gave toilettries to shower. Endorsing passive SI with plan, but no intent. No HI. Endorses AVH at times. No s/s pain, discomfort, or acute distress. Will continue to monitor for safety

## 2020-08-18 NOTE — ED Notes (Signed)
[facility] called for STAT lab pickup 

## 2020-08-18 NOTE — ED Provider Notes (Signed)
Behavioral Health Admission H&P [facility] & OBS)  Date: 08/18/20 Patient Name: Ryan Kelley MRN: [id number] Chief Complaint:  Chief Complaint  Patient presents with  . Suicidal  . Hallucinations  . Addiction Problem      Diagnoses:  Final diagnoses:  Substance induced mood disorder (HCC)  Opioid abuse, continuous (HCC)  Benzodiazepine abuse (HCC)    HPI: 35 yo male with h/o depression and substance abuse (bzd and opioids) who presented voluntarily to the [facility] accompanied by his mother with SI and AVH. He states that he presented today because he was having "a severe mental health crisis". He states that he has been having SI intermittently for months but that the last 3-5 days it has increased in frequency and severity. He states he currently has a plan to overdose or to hang himself.  He states that he was at [facility] for the last 3 days but did not complete detox from substances first and therefore began to go into withdrawal. He states that he normally 5-20 pills of xanax and klonopin daily, unable to quantify milligrams used since he purchases whatever he is able to obtain off the street; he last used benzos yesterday. He states that prior to coming into the [facility] with his mother, he told her he needed to stop at a gas station for a snack;however, he met someone to purchase heroin from. He states his last use of heroin was prior to presentation; used approximately 1 gram.  He states that he is currently experiencing CAH instructing him to harm  Himself and others as well as VH of demons; he states that hallucinations tend to occur in context of periods of time when not using benzos. He reports benzo withdrawal sx of hallucinations, increased anxiety. He denies opioid withdrawal sx of nausea, vomiting, diarrhea, lacrimation, rhinorrhea, diarrhea. He states that his mother observed him "talking to someone who wasn't there" prior to presentation to the [facility]. Patient unable to contract for  safety. See TTS note for collateral. Patient is amenable for overnight observation. Discussed treating symptomatic benzo withdrawal- will order one time dose of librium 25 mg as well as PRNs for opioid withdrawal.    Past Psychiatric History: Previous Medication Trials: yes, reports citalopram and trazodone Previous Psychiatric Hospitalizations: yes Previous Suicide Attempts: denies but reports accidental overdoses onopioids History of Violence: denies Outpatient psychiatrist: no  Social History: Marital Status: married Children: 0 Source of Income: unemployed for ~2 years; previously in [employer] Education: Insurance claims handler Ed: did not assess Housing Status: with mother History of phys/sexual abuse: did not assess Easy access to gun: denies  Substance Use (with emphasis over the last 12 months) Recreational Drugs: heroin and bzd as above Use of Alcohol: denied Tobacco Use: yes   Legal History: Past Charges/Incarcerations: yes, for Breaking and entering and larceny Pending charges: denies  Family Psychiatric History: Reports family history of schizophrenia; unable to provide details   PHQ 2-9:  [facility] from 08/18/2020 in [facility]  Thoughts that you would be better off dead, or of hurting yourself in some way Nearly every day  [Phreesia 08/18/2020]  PHQ-9 Total Score 27      [facility] from 08/18/2020 in [facility] from 05/27/2020 in [place] High Risk High Risk       Total Time spent with patient: 30 minutes  Musculoskeletal  Strength & Muscle Tone: within normal  limits Gait & Station: normal Patient leans: N/A  Psychiatric Specialty Exam  Presentation General Appearance: Casual; Disheveled; Appropriate for Environment (pacing around the room, appears anxious)  Eye Contact:Fair  Speech:Clear and  Coherent; Normal Rate  Speech Volume:Normal  Handedness:No data recorded  Mood and Affect  Mood:Dysphoric; Depressed  Affect:Appropriate; Congruent   Thought Process  Thought Processes:Coherent; Goal Directed  Descriptions of Associations:Intact  Orientation:Full (Time, Place and Person)  Thought Content:WDL  Hallucinations:Hallucinations: Auditory; Command; Visual Description of Command Hallucinations: CAH instructing to harm himself and others Description of Auditory Hallucinations: CAH; see CAH section Description of Visual Hallucinations: demons  Ideas of Reference:None  Suicidal Thoughts:Suicidal Thoughts: Yes, Active (plan to overdose or hang self) SI Active Intent and/or Plan: With Intent; With Plan; With Access to Means; With Means to Carry Out  Homicidal Thoughts:Homicidal Thoughts: Yes, Passive (CAH instructing him to harm others) HI Passive Intent and/or Plan: Without Intent   Sensorium  Memory:Immediate Good; Recent Good  Judgment:Fair  Insight:Fair   Executive Functions  Concentration:Fair  Attention Span:Fair  [place]   Psychomotor Activity  Psychomotor Activity:Psychomotor Activity: Restlessness (patient pacing around assessment room)   Assets  Assets:Communication Skills; Desire for Improvement; Physical Health; Resilience; Social Support   Sleep  Sleep:Sleep: Poor   Physical Exam Constitutional:      Appearance: Normal appearance.  HENT:     Head: Normocephalic and atraumatic.  Eyes:     Extraocular Movements: Extraocular movements intact.  Pulmonary:     Effort: Pulmonary effort is normal.  Neurological:     Mental Status: He is alert.    Review of Systems  Constitutional: Negative for chills and fever.  Gastrointestinal: Negative for diarrhea, nausea and vomiting.  Musculoskeletal: Negative for joint pain and myalgias.  Neurological: Negative for speech change and focal  weakness.  Psychiatric/Behavioral: Positive for depression, hallucinations, substance abuse and suicidal ideas. The patient is nervous/anxious.     Blood pressure 137/77, pulse (!) 102, temperature 97.8 F (36.6 C), temperature source Tympanic, resp. rate 20, height 5' 5" (1.651 m), weight 100.7 kg, SpO2 98 %. Body mass index is 36.94 kg/m.  Past Psychiatric History: see above   Is the patient at risk to self? Yes  Has the patient been a risk to self in the past 6 months? Yes .    Has the patient been a risk to self within the distant past? Yes   Is the patient a risk to others? No   Has the patient been a risk to others in the past 6 months? No   Has the patient been a risk to others within the distant past? No   Past Medical History:  Past Medical History:  Diagnosis Date  . Anxiety   . Attempted suicide ([place])   . Chronic back pain   . Chronic knee pain   . Depression   . Insomnia   . PTSD (post-traumatic stress disorder)   . Substance abuse ([place])    No past surgical history on file.  Family History:  Family History  Problem Relation Age of Onset  . Diabetes Mother   . Cancer Mother   . Hypertension Mother   . Hypertension Father     Social History:  Social History   Socioeconomic History  . Marital status: Single    Spouse name: Not on file  . Number of children: Not on file  . Years of education: Not on file  . Highest education level: Not  on file  Occupational History  . Not on file  Tobacco Use  . Smoking status: Current Every Day Smoker    Packs/day: 1.00    Years: 7.00    Pack years: 7.00    Types: Cigarettes  . Smokeless tobacco: Never Used  Substance and Sexual Activity  . Alcohol use: Yes    Comment: occasionally   . Drug use: Yes    Frequency: 5.0 times per week    Types: Hydrocodone, Oxycodone, Benzodiazepines, Marijuana    Comment: marijuana last used 07/06/16, heroin last used 07/07/16  . Sexual activity: Not on file  Other Topics  Concern  . Not on file  Social History Narrative   ** Merged History Encounter **       Social Determinants of Health   Financial Resource Strain: Not on file  Food Insecurity: Not on file  Transportation Needs: Not on file  Physical Activity: Not on file  Stress: Not on file  Social Connections: Not on file  Intimate Partner Violence: Not on file    SDOH:  SDOH Screenings   Alcohol Screen: Not on file  Depression (PHQ2-9): Medium Risk  . PHQ-2 Score: 27  Financial Resource Strain: Not on file  Food Insecurity: Not on file  Housing: Not on file  Physical Activity: Not on file  Social Connections: Not on file  Stress: Not on file  Tobacco Use: High Risk  . Smoking Tobacco Use: Current Every Day Smoker  . Smokeless Tobacco Use: Never Used  Transportation Needs: Not on file    Last Labs:  Admission on 05/28/2020, Discharged on 05/29/2020  Component Date Value Ref Range Status  . Sodium 05/28/2020 135  135 - 145 mmol/L Final  . Potassium 05/28/2020 3.7  3.5 - 5.1 mmol/L Final  . Chloride 05/28/2020 102  98 - 111 mmol/L Final  . CO2 05/28/2020 22  22 - 32 mmol/L Final  . Glucose, Bld 05/28/2020 87  70 - 99 mg/dL Final   Glucose reference range applies only to samples taken after fasting for at least 8 hours.  . BUN 05/28/2020 13  6 - 20 mg/dL Final  . Creatinine, Ser 05/28/2020 0.77  0.61 - 1.24 mg/dL Final  . Calcium 05/28/2020 9.5  8.9 - 10.3 mg/dL Final  . Total Protein 05/28/2020 7.9  6.5 - 8.1 g/dL Final  . Albumin 05/28/2020 4.7  3.5 - 5.0 g/dL Final  . AST 05/28/2020 23  15 - 41 U/L Final  . ALT 05/28/2020 22  0 - 44 U/L Final  . Alkaline Phosphatase 05/28/2020 77  38 - 126 U/L Final  . Total Bilirubin 05/28/2020 0.8  0.3 - 1.2 mg/dL Final  . GFR, Estimated 05/28/2020 >60  >60 mL/min Final  . Anion gap 05/28/2020 11  5 - 15 Final   Performed at [facility], [address] [place], [place] [address]  . Alcohol, Ethyl (B) 05/28/2020 <10  <10 mg/dL Final    Comment: (NOTE) Lowest detectable limit for serum alcohol is 10 mg/dL.  For medical purposes only. Performed at [facility], [address], [place], [place] [address]   . Opiates 05/28/2020 NONE DETECTED  NONE DETECTED Final  . Cocaine 05/28/2020 NONE DETECTED  NONE DETECTED Final  . Benzodiazepines 05/28/2020 POSITIVE* NONE DETECTED Final  . Amphetamines 05/28/2020 NONE DETECTED  NONE DETECTED Final  . Tetrahydrocannabinol 05/28/2020 POSITIVE* NONE DETECTED Final  . Barbiturates 05/28/2020 NONE DETECTED  NONE DETECTED Final   Comment: (NOTE) DRUG SCREEN FOR MEDICAL  PURPOSES ONLY.  IF CONFIRMATION IS NEEDED FOR ANY PURPOSE, NOTIFY LAB WITHIN 5 DAYS.  LOWEST DETECTABLE LIMITS FOR URINE DRUG SCREEN Drug Class                     Cutoff (ng/mL) Amphetamine and metabolites    1000 Barbiturate and metabolites    200 Benzodiazepine                 384 Tricyclics and metabolites     300 Opiates and metabolites        300 Cocaine and metabolites        300 THC                            50 Performed at [facility], [address] [place], [place] [address]   . WBC 05/28/2020 21.4* 4.0 - 10.5 K/uL Final  . RBC 05/28/2020 5.40  4.22 - 5.81 MIL/uL Final  . Hemoglobin 05/28/2020 16.8  13.0 - 17.0 g/dL Final  . HCT 05/28/2020 48.5  39.0 - 52.0 % Final  . MCV 05/28/2020 89.8  80.0 - 100.0 fL Final  . MCH 05/28/2020 31.1  26.0 - 34.0 pg Final  . MCHC 05/28/2020 34.6  30.0 - 36.0 g/dL Final  . RDW 05/28/2020 12.4  11.5 - 15.5 % Final  . Platelets 05/28/2020 353  150 - 400 K/uL Final  . nRBC 05/28/2020 0.0  0.0 - 0.2 % Final  . Neutrophils Relative % 05/28/2020 82  % Final  . Neutro Abs 05/28/2020 17.4* 1.7 - 7.7 K/uL Final  . Lymphocytes Relative 05/28/2020 13  % Final  . Lymphs Abs 05/28/2020 2.8  0.7 - 4.0 K/uL Final  . Monocytes Relative 05/28/2020 4  % Final  . Monocytes Absolute 05/28/2020 0.9  0.1 - 1.0 K/uL Final  . Eosinophils Relative 05/28/2020 0  % Final  . Eosinophils  Absolute 05/28/2020 0.1  0.0 - 0.5 K/uL Final  . Basophils Relative 05/28/2020 0  % Final  . Basophils Absolute 05/28/2020 0.1  0.0 - 0.1 K/uL Final  . Immature Granulocytes 05/28/2020 1  % Final  . Abs Immature Granulocytes 05/28/2020 0.12* 0.00 - 0.07 K/uL Final   Performed at [facility], [address], [place], [place] [address]  Admission on 05/27/2020, Discharged on 05/28/2020  Component Date Value Ref Range Status  . Sodium 05/27/2020 138  135 - 145 mmol/L Final  . Potassium 05/27/2020 3.9  3.5 - 5.1 mmol/L Final  . Chloride 05/27/2020 100  98 - 111 mmol/L Final  . CO2 05/27/2020 29  22 - 32 mmol/L Final  . Glucose, Bld 05/27/2020 91  70 - 99 mg/dL Final   Glucose reference range applies only to samples taken after fasting for at least 8 hours.  . BUN 05/27/2020 13  6 - 20 mg/dL Final  . Creatinine, Ser 05/27/2020 0.87  0.61 - 1.24 mg/dL Final  . Calcium 05/27/2020 9.3  8.9 - 10.3 mg/dL Final  . Total Protein 05/27/2020 8.1  6.5 - 8.1 g/dL Final  . Albumin 05/27/2020 4.8  3.5 - 5.0 g/dL Final  . AST 05/27/2020 24  15 - 41 U/L Final  . ALT 05/27/2020 22  0 - 44 U/L Final  . Alkaline Phosphatase 05/27/2020 79  38 - 126 U/L Final  . Total Bilirubin 05/27/2020 0.6  0.3 - 1.2 mg/dL Final  . GFR, Estimated 05/27/2020 >60  >60 mL/min Final  .  Anion gap 05/27/2020 9  5 - 15 Final   Performed at [facility], [address] [place], [place] [address]  . Alcohol, Ethyl (B) 05/27/2020 <10  <10 mg/dL Final   Comment: (NOTE) Lowest detectable limit for serum alcohol is 10 mg/dL.  For medical purposes only. Performed at [facility], [address] [place], [place] [address]   . Salicylate Lvl 68/07/7516 <7.0* 7.0 - 30.0 mg/dL Final   Performed at [facility], [address] [place], [place] [address]  . Acetaminophen (Tylenol), Serum 05/27/2020 <10* 10 - 30 ug/mL Final   Comment: (NOTE) Therapeutic concentrations vary significantly. A range of 10-30  ug/mL  may be an effective concentration for many patients. However, some  are best treated at concentrations outside of this range. Acetaminophen concentrations >150 ug/mL at 4 hours after ingestion  and >50 ug/mL at 12 hours after ingestion are often associated with  toxic reactions.  Performed at [facility], [address], [place], [place] [address]   . WBC 05/27/2020 10.7* 4.0 - 10.5 K/uL Final  . RBC 05/27/2020 5.41  4.22 - 5.81 MIL/uL Final  . Hemoglobin 05/27/2020 16.8  13.0 - 17.0 g/dL Final  . HCT 05/27/2020 47.6  39.0 - 52.0 % Final  . MCV 05/27/2020 88.0  80.0 - 100.0 fL Final  . MCH 05/27/2020 31.1  26.0 - 34.0 pg Final  . MCHC 05/27/2020 35.3  30.0 - 36.0 g/dL Final  . RDW 05/27/2020 12.5  11.5 - 15.5 % Final  . Platelets 05/27/2020 320  150 - 400 K/uL Final  . nRBC 05/27/2020 0.0  0.0 - 0.2 % Final   Performed at [facility], [address] [place], [place] [address]  . Tricyclic, Ur Screen 38/46/6599 POSITIVE* NONE DETECTED Final  . Amphetamines, Ur Screen 05/27/2020 NONE DETECTED  NONE DETECTED Final  . MDMA (Ecstasy)Ur Screen 05/27/2020 NONE DETECTED  NONE DETECTED Final  . Cocaine Metabolite,Ur [place] 05/27/2020 NONE DETECTED  NONE DETECTED Final  . Opiate, Ur Screen 05/27/2020 POSITIVE* NONE DETECTED Final  . Phencyclidine (PCP) Ur S 05/27/2020 NONE DETECTED  NONE DETECTED Final  . Cannabinoid 50 Ng, Ur [place] 05/27/2020 POSITIVE* NONE DETECTED Final  . Barbiturates, Ur Screen 05/27/2020 NONE DETECTED  NONE DETECTED Final  . Benzodiazepine, Ur Scrn 05/27/2020 POSITIVE* NONE DETECTED Final  . Methadone Scn, Ur 05/27/2020 POSITIVE* NONE DETECTED Final   Comment: (NOTE) Tricyclics + metabolites, urine    Cutoff 1000 ng/mL Amphetamines + metabolites, urine  Cutoff 1000 ng/mL MDMA (Ecstasy), urine              Cutoff 500 ng/mL Cocaine Metabolite, urine          Cutoff 300 ng/mL Opiate + metabolites, urine        Cutoff 300 ng/mL Phencyclidine (PCP),  urine         Cutoff 25 ng/mL Cannabinoid, urine                 Cutoff 50 ng/mL Barbiturates + metabolites, urine  Cutoff 200 ng/mL Benzodiazepine, urine              Cutoff 200 ng/mL Methadone, urine                   Cutoff 300 ng/mL  The urine drug screen provides only a preliminary, unconfirmed analytical test result and should not be used for non-medical purposes. Clinical consideration and professional judgment should be applied to any positive drug  screen result due to possible interfering substances. A more specific alternate chemical method must be used in order to obtain a confirmed analytical result. Gas chromatography / mass spectrometry (GC/MS) is the preferred confirm                          atory method. Performed at [facility], [address], [place], [place] [address]   . SARS Coronavirus 2 by RT PCR 05/27/2020 NEGATIVE  NEGATIVE Final   Comment: (NOTE) SARS-CoV-2 target nucleic acids are NOT DETECTED.  The SARS-CoV-2 RNA is generally detectable in upper respiratoy specimens during the acute phase of infection. The lowest concentration of SARS-CoV-2 viral copies this assay can detect is 131 copies/mL. A negative result does not preclude SARS-Cov-2 infection and should not be used as the sole basis for treatment or other patient management decisions. A negative result may occur with  improper specimen collection/handling, submission of specimen other than nasopharyngeal swab, presence of viral mutation(s) within the areas targeted by this assay, and inadequate number of viral copies (<131 copies/mL). A negative result must be combined with clinical observations, patient history, and epidemiological information. The expected result is Negative.  Fact Sheet for Patients:  PinkCheek.be  Fact Sheet for Healthcare Providers:  GravelBags.it  This test is no                          t yet  approved or cleared by the [place] FDA and  has been authorized for detection and/or diagnosis of SARS-CoV-2 by FDA under an Emergency Use Authorization (EUA). This EUA will remain  in effect (meaning this test can be used) for the duration of the COVID-19 declaration under Section 564(b)(1) of the Act, 21 U.S.C. section 360bbb-3(b)(1), unless the authorization is terminated or revoked sooner.    . Influenza A by PCR 05/27/2020 NEGATIVE  NEGATIVE Final  . Influenza B by PCR 05/27/2020 NEGATIVE  NEGATIVE Final   Comment: (NOTE) The Xpert Xpress SARS-CoV-2/FLU/RSV assay is intended as an aid in  the diagnosis of influenza from Nasopharyngeal swab specimens and  should not be used as a sole basis for treatment. Nasal washings and  aspirates are unacceptable for Xpert Xpress SARS-CoV-2/FLU/RSV  testing.  Fact Sheet for Patients: PinkCheek.be  Fact Sheet for Healthcare Providers: GravelBags.it  This test is not yet approved or cleared by the [place] FDA and  has been authorized for detection and/or diagnosis of SARS-CoV-2 by  FDA under an Emergency Use Authorization (EUA). This EUA will remain  in effect (meaning this test can be used) for the duration of the  Covid-19 declaration under Section 564(b)(1) of the Act, 21  U.S.C. section 360bbb-3(b)(1), unless the authorization is  terminated or revoked. Performed at [facility], [address], [place], [place] [address]   Admission on 05/22/2020, Discharged on 05/23/2020  Component Date Value Ref Range Status  . WBC 05/22/2020 15.7* 4.0 - 10.5 K/uL Final  . RBC 05/22/2020 5.15  4.22 - 5.81 MIL/uL Final  . Hemoglobin 05/22/2020 15.9  13.0 - 17.0 g/dL Final  . HCT 05/22/2020 45.5  39.0 - 52.0 % Final  . MCV 05/22/2020 88.3  80.0 - 100.0 fL Final  . MCH 05/22/2020 30.9  26.0 - 34.0 pg Final  . MCHC 05/22/2020 34.9  30.0 - 36.0 g/dL Final  . RDW  05/22/2020 12.1  11.5 - 15.5 % Final  . Platelets 05/22/2020  286  150 - 400 K/uL Final  . nRBC 05/22/2020 0.0  0.0 - 0.2 % Final   Performed at [facility], [address] [place], [place] [address]  . Sodium 05/22/2020 135  135 - 145 mmol/L Final  . Potassium 05/22/2020 4.3  3.5 - 5.1 mmol/L Final  . Chloride 05/22/2020 98  98 - 111 mmol/L Final  . CO2 05/22/2020 23  22 - 32 mmol/L Final  . Glucose, Bld 05/22/2020 102* 70 - 99 mg/dL Final   Glucose reference range applies only to samples taken after fasting for at least 8 hours.  . BUN 05/22/2020 14  6 - 20 mg/dL Final  . Creatinine, Ser 05/22/2020 0.96  0.61 - 1.24 mg/dL Final  . Calcium 05/22/2020 9.5  8.9 - 10.3 mg/dL Final  . Total Protein 05/22/2020 8.1  6.5 - 8.1 g/dL Final  . Albumin 05/22/2020 4.8  3.5 - 5.0 g/dL Final  . AST 05/22/2020 23  15 - 41 U/L Final  . ALT 05/22/2020 24  0 - 44 U/L Final  . Alkaline Phosphatase 05/22/2020 82  38 - 126 U/L Final  . Total Bilirubin 05/22/2020 0.7  0.3 - 1.2 mg/dL Final  . GFR, Estimated 05/22/2020 >60  >60 mL/min Final  . Anion gap 05/22/2020 14  5 - 15 Final   Performed at [facility], [address], [place], [place] [address]  . Troponin I (High Sensitivity) 05/22/2020 3  <18 ng/L Final   Comment: (NOTE) Elevated high sensitivity troponin I (hsTnI) values and significant  changes across serial measurements may suggest ACS but many other  chronic and acute conditions are known to elevate hsTnI results.  Refer to the "Links" section for chest pain algorithms and additional  guidance. Performed at [facility], [address] [place], [place] [address]   . Alcohol, Ethyl (B) 05/22/2020 <10  <10 mg/dL Final   Comment: (NOTE) Lowest detectable limit for serum alcohol is 10 mg/dL.  For medical purposes only. Performed at [facility], [address], [place],  [address]   . Tricyclic, Ur Screen 21/30/8657 POSITIVE* NONE DETECTED  Final  . Amphetamines, Ur Screen 05/22/2020 POSITIVE* NONE DETECTED Final  . MDMA (Ecstasy)Ur Screen 05/22/2020 NONE DETECTED  NONE DETECTED Final  . Cocaine Metabolite,Ur [place] 05/22/2020 NONE DETECTED  NONE DETECTED Final  . Opiate, Ur Screen 05/22/2020 NONE DETECTED  NONE DETECTED Final  . Phencyclidine (PCP) Ur S 05/22/2020 NONE DETECTED  NONE DETECTED Final  . Cannabinoid 50 Ng, Ur [place] 05/22/2020 POSITIVE* NONE DETECTED Final  . Barbiturates, Ur Screen 05/22/2020 NONE DETECTED  NONE DETECTED Final  . Benzodiazepine, Ur Scrn 05/22/2020 POSITIVE* NONE DETECTED Final  . Methadone Scn, Ur 05/22/2020 NONE DETECTED  NONE DETECTED Final   Comment: (NOTE) Tricyclics + metabolites, urine    Cutoff 1000 ng/mL Amphetamines + metabolites, urine  Cutoff 1000 ng/mL MDMA (Ecstasy), urine              Cutoff 500 ng/mL Cocaine Metabolite, urine          Cutoff 300 ng/mL Opiate + metabolites, urine        Cutoff 300 ng/mL Phencyclidine (PCP), urine         Cutoff 25 ng/mL Cannabinoid, urine                 Cutoff 50 ng/mL Barbiturates + metabolites, urine  Cutoff 200 ng/mL Benzodiazepine, urine  Cutoff 200 ng/mL Methadone, urine                   Cutoff 300 ng/mL  The urine drug screen provides only a preliminary, unconfirmed analytical test result and should not be used for non-medical purposes. Clinical consideration and professional judgment should be applied to any positive drug screen result due to possible interfering substances. A more specific alternate chemical method must be used in order to obtain a confirmed analytical result. Gas chromatography / mass spectrometry (GC/MS) is the preferred confirm                          atory method. Performed at [facility], [address], [place], [place] [address]   . SARS Coronavirus 2 by RT PCR 05/22/2020 NEGATIVE  NEGATIVE Final   Comment: (NOTE) SARS-CoV-2 target nucleic acids are NOT DETECTED.  The SARS-CoV-2 RNA is  generally detectable in upper respiratoy specimens during the acute phase of infection. The lowest concentration of SARS-CoV-2 viral copies this assay can detect is 131 copies/mL. A negative result does not preclude SARS-Cov-2 infection and should not be used as the sole basis for treatment or other patient management decisions. A negative result may occur with  improper specimen collection/handling, submission of specimen other than nasopharyngeal swab, presence of viral mutation(s) within the areas targeted by this assay, and inadequate number of viral copies (<131 copies/mL). A negative result must be combined with clinical observations, patient history, and epidemiological information. The expected result is Negative.  Fact Sheet for Patients:  PinkCheek.be  Fact Sheet for Healthcare Providers:  GravelBags.it  This test is no                          t yet approved or cleared by the [place] FDA and  has been authorized for detection and/or diagnosis of SARS-CoV-2 by FDA under an Emergency Use Authorization (EUA). This EUA will remain  in effect (meaning this test can be used) for the duration of the COVID-19 declaration under Section 564(b)(1) of the Act, 21 U.S.C. section 360bbb-3(b)(1), unless the authorization is terminated or revoked sooner.    . Influenza A by PCR 05/22/2020 NEGATIVE  NEGATIVE Final  . Influenza B by PCR 05/22/2020 NEGATIVE  NEGATIVE Final   Comment: (NOTE) The Xpert Xpress SARS-CoV-2/FLU/RSV assay is intended as an aid in  the diagnosis of influenza from Nasopharyngeal swab specimens and  should not be used as a sole basis for treatment. Nasal washings and  aspirates are unacceptable for Xpert Xpress SARS-CoV-2/FLU/RSV  testing.  Fact Sheet for Patients: PinkCheek.be  Fact Sheet for Healthcare Providers: GravelBags.it  This test  is not yet approved or cleared by the [place] FDA and  has been authorized for detection and/or diagnosis of SARS-CoV-2 by  FDA under an Emergency Use Authorization (EUA). This EUA will remain  in effect (meaning this test can be used) for the duration of the  Covid-19 declaration under Section 564(b)(1) of the Act, 21  U.S.C. section 360bbb-3(b)(1), unless the authorization is  terminated or revoked. Performed at [facility], [address] [place], [place] [address]   . Troponin I (High Sensitivity) 05/22/2020 3  <18 ng/L Final   Comment: (NOTE) Elevated high sensitivity troponin I (hsTnI) values and significant  changes across serial measurements may suggest ACS but many other  chronic and acute conditions are known to elevate hsTnI  results.  Refer to the "Links" section for chest pain algorithms and additional  guidance. Performed at [facility], [address] [place], [place] [address]     Allergies: Poison ivy extract [poison ivy extract]  PTA Medications: (Not in a hospital admission)   Medical Decision Making  Patient reporting SI with a plan as well as CAH and VH. Patient is unable to contract for safety, will admit for overnight observation. Routine Labs ordered- CBC, CMP, TSH, a1c, lipid panel, ethanol, UDS  -one time dose of librium 25 mg for reported symptoms of benzo withdrawal. May need additional doses if indicated.  Opiate Withdrawal Protocol: -Clonidine 0.740m PO q4hr PRN for withdrawal associated HTN -Bentyl 157mPO q6hr PRN for abdominal muscle cramps -Loperamide 40m85mO q6hr PRN for diarrhea -Robaxin 750m45m q8hr PRN for muscle spasm -Zofran 4mg 74mq8hr PRN for nausea -Vistaril 25mg 67m8hr PRN for anxiety      Recommendations  Based on my evaluation the patient does not appear to have an emergency medical condition.  KatherIval Bible1/10/22  5:20 PM

## 2020-08-18 NOTE — ED Triage Notes (Signed)
Pt presents endorsing SA, SI with a plan to overdose or hang himself and AVH. Pt reports he sees demons and his mother saw him having "a full-blown conversation with people that weren't there." Pt reports "I used heroin today and benzos yesterday."

## 2020-08-18 NOTE — ED Notes (Signed)
Pt is currently in the shower.

## 2020-08-18 NOTE — ED Notes (Signed)
Pt sleeping@this time. breathing even and unlabored. Will continue to monitor for safety 

## 2020-08-18 NOTE — ED Notes (Signed)
blood work obtained called [facility] for stat pick up

## 2020-08-18 NOTE — ED Notes (Signed)
Pt standing up on unit pacing, taking on phone and stating how he is feeling@this  time. Pt expressed he is tired and requesting something to help him sleep. No c/o of pain or distress. Will continue to monitor for safety

## 2020-08-18 NOTE — ED Notes (Signed)
Attempted 2x to draw blood from pt with no success. Pt tolerated well

## 2020-08-19 LAB — LIPID PANEL
Cholesterol: 258 mg/dL — ABNORMAL HIGH (ref 0–200)
HDL: 39 mg/dL — ABNORMAL LOW (ref >40)
LDL Cholesterol: 171 mg/dL — ABNORMAL HIGH (ref 0–99)
Total CHOL/HDL Ratio: 6.6 RATIO
Triglycerides: 241 mg/dL — ABNORMAL HIGH (ref <150)
VLDL: 48 mg/dL — ABNORMAL HIGH (ref 0–40)

## 2020-08-19 LAB — CBC WITH DIFFERENTIAL/PLATELET
Abs Immature Granulocytes: 0.05 10*3/uL (ref 0.00–0.07)
Basophils Absolute: 0.1 10*3/uL (ref 0.0–0.1)
Basophils Relative: 1 %
Eosinophils Absolute: 0.2 10*3/uL (ref 0.0–0.5)
Eosinophils Relative: 1 %
HCT: 42.8 % (ref 39.0–52.0)
Hemoglobin: 15.4 g/dL (ref 13.0–17.0)
Immature Granulocytes: 0 %
Lymphocytes Relative: 23 %
Lymphs Abs: 2.8 10*3/uL (ref 0.7–4.0)
MCH: 31.6 pg (ref 26.0–34.0)
MCHC: 36 g/dL (ref 30.0–36.0)
MCV: 87.7 fL (ref 80.0–100.0)
Monocytes Absolute: 0.8 10*3/uL (ref 0.1–1.0)
Monocytes Relative: 7 %
Neutro Abs: 8.2 10*3/uL — ABNORMAL HIGH (ref 1.7–7.7)
Neutrophils Relative %: 68 %
Platelets: 282 10*3/uL (ref 150–400)
RBC: 4.88 MIL/uL (ref 4.22–5.81)
RDW: 13.1 % (ref 11.5–15.5)
WBC: 12.1 10*3/uL — ABNORMAL HIGH (ref 4.0–10.5)
nRBC: 0 % (ref 0.0–0.2)

## 2020-08-19 LAB — TSH: TSH: 3.403 u[IU]/mL (ref 0.350–4.500)

## 2020-08-19 LAB — COMPREHENSIVE METABOLIC PANEL
ALT: 28 U/L (ref 0–44)
AST: 26 U/L (ref 15–41)
Albumin: 4.4 g/dL (ref 3.5–5.0)
Alkaline Phosphatase: 71 U/L (ref 38–126)
Anion gap: 12 (ref 5–15)
BUN: 16 mg/dL (ref 6–20)
CO2: 23 mmol/L (ref 22–32)
Calcium: 9 mg/dL (ref 8.9–10.3)
Chloride: 97 mmol/L — ABNORMAL LOW (ref 98–111)
Creatinine, Ser: 1.18 mg/dL (ref 0.61–1.24)
GFR, Estimated: >60 mL/min (ref >60)
Glucose, Bld: 76 mg/dL (ref 70–99)
Potassium: 4.1 mmol/L (ref 3.5–5.1)
Sodium: 132 mmol/L — ABNORMAL LOW (ref 135–145)
Total Bilirubin: 0.6 mg/dL (ref 0.3–1.2)
Total Protein: 6.8 g/dL (ref 6.5–8.1)

## 2020-08-19 LAB — HEMOGLOBIN A1C
Hgb A1c MFr Bld: 5.1 % (ref 4.8–5.6)
Mean Plasma Glucose: 99.67 mg/dL

## 2020-08-19 LAB — ETHANOL: Alcohol, Ethyl (B): <10 mg/dL (ref <10)

## 2020-08-19 MED ORDER — NICOTINE POLACRILEX 2 MG MT GUM: 2.0000 mg | CHEWING_GUM | OROMUCOSAL | 0 refills | Status: DC | PRN

## 2020-08-19 NOTE — ED Provider Notes (Signed)
FBC/OBS ASAP Discharge Summary  Date and Time: 08/19/2020 10:16 AM  Name: Ryan Kelley  MRN:  [id number]   Discharge Diagnoses:  Final diagnoses:  Substance induced mood disorder ([place])  Opioid abuse, continuous ([place])  Benzodiazepine abuse ([place])    Subjective: Patient interviewed this AM bedside. He is calm, cooperative and pleasant. He states that he slept well and expressed that it was the best night's sleep he has had in awhile. He describes his mood as "ok". He denies SI/HI/AVH. He expresses interest in detox for substance use-reported . Discussed [facility] with patient; informed patient that it is a walk in facility and that if he is denied he will need his own ride home. Patient verbalized understanding and stated that he will call his family to pick him up from the [facility] and take him to the detox facility . Pt states that he does not have any home medications but requests an rx for nicorette gum in the event that the detox is a non smoking facility.   On my interview, patient is in NAD, alert, oriented, calm, cooperative, and attentive, with normal affect, speech, and behavior. Objectively, there is no evidence of psychosis/ mania (able to converse coherently, linear and goal directed thought, no RIS, no distractibility, not pre-occupied, no FOI, etc) nor depression to the point of suicidality (able to concentrate, affect full and reactive, speech normal r/v/t, no psychomotor retardation/agitation, etc).  Overall, patient appears to be at the point, in the absence of inhibiting or disinhibiting symptoms, where he can successfully move to lesser restrictive setting for care.    Stay Summary:  35 yo male with h/o depression and substance abuse (bzd and opioids) who presented voluntarily to the [facility] accompanied by his mother with SI and AVH. He states that he presented today because he was having "a severe mental health crisis". He states that he has been having SI intermittently for months  but that the last 3-5 days it has increased in frequency and severity. He states he currently has a plan to overdose or to hang himself.  He states that he was at [facility] for the last 3 days but did not complete detox from substances first and therefore began to go into withdrawal. He states that he normally 5-20 pills of xanax and klonopin daily, unable to quantify milligrams used since he purchases whatever he is able to obtain off the street; he last used benzos yesterday. He states that prior to coming into the [facility] with his mother, he told her he needed to stop at a gas station for a snack;however, he met someone to purchase heroin from. He states his last use of heroin was prior to presentation; used approximately 1 gram.  He states that he is currently experiencing CAH instructing him to harm  Himself and others as well as VH of demons; he states that hallucinations tend to occur in context of periods of time when not using benzos. He reports benzo withdrawal sx of hallucinations, increased anxiety. He denies opioid withdrawal sx of nausea, vomiting, diarrhea, lacrimation, rhinorrhea, diarrhea. He states that his mother observed him "talking to someone who wasn't there" prior to presentation to the [facility]. UDS+BZD and MJ. Patient unable to contract for safety. See TTS note for collateral. Patient is amenable for overnight observation. Discussed treating symptomatic benzo withdrawal; he received one time dose of librium 25 mg for BZD withdrawal and PRNs for opioid withdrawal were ordered. Patient was observed overnight and the following morning  patient denied SI/HI/AVH and expressed interest in detox- see above for details.    Total Time spent with patient: 20 minutes  Past Psychiatric History: see H&P Past Medical History:  Past Medical History:  Diagnosis Date  . Anxiety   . Attempted suicide ([place])   . Chronic back pain   . Chronic knee pain   . Depression   . Insomnia   . PTSD  (post-traumatic stress disorder)   . Substance abuse ([place])    No past surgical history on file. Family History:  Family History  Problem Relation Age of Onset  . Diabetes Mother   . Cancer Mother   . Hypertension Mother   . Hypertension Father    Family Psychiatric History: see H&P Social History:  Social History   Substance and Sexual Activity  Alcohol Use Yes   Comment: occasionally      Social History   Substance and Sexual Activity  Drug Use Yes  . Frequency: 5.0 times per week  . Types: Hydrocodone, Oxycodone, Benzodiazepines, Marijuana   Comment: marijuana last used 07/06/16, heroin last used 07/07/16    Social History   Socioeconomic History  . Marital status: Single    Spouse name: Not on file  . Number of children: Not on file  . Years of education: Not on file  . Highest education level: Not on file  Occupational History  . Not on file  Tobacco Use  . Smoking status: Current Every Day Smoker    Packs/day: 1.00    Years: 7.00    Pack years: 7.00    Types: Cigarettes  . Smokeless tobacco: Never Used  Substance and Sexual Activity  . Alcohol use: Yes    Comment: occasionally   . Drug use: Yes    Frequency: 5.0 times per week    Types: Hydrocodone, Oxycodone, Benzodiazepines, Marijuana    Comment: marijuana last used 07/06/16, heroin last used 07/07/16  . Sexual activity: Not on file  Other Topics Concern  . Not on file  Social History Narrative   ** Merged History Encounter **       Social Determinants of Health   Financial Resource Strain: Not on file  Food Insecurity: Not on file  Transportation Needs: Not on file  Physical Activity: Not on file  Stress: Not on file  Social Connections: Not on file   SDOH:  SDOH Screenings   Alcohol Screen: Not on file  Depression (PHQ2-9): Medium Risk  . PHQ-2 Score: 27  Financial Resource Strain: Not on file  Food Insecurity: Not on file  Housing: Not on file  Physical Activity: Not on file   Social Connections: Not on file  Stress: Not on file  Tobacco Use: High Risk  . Smoking Tobacco Use: Current Every Day Smoker  . Smokeless Tobacco Use: Never Used  Transportation Needs: Not on file    Has this patient used any form of tobacco in the last 30 days? (Cigarettes, Smokeless Tobacco, Cigars, and/or Pipes) A prescription for an FDA-approved tobacco cessation medication was offered at discharge and the patient refused  Current Medications:  Current Facility-Administered Medications  Medication Dose Route Frequency Provider Last Rate Last Admin  . acetaminophen (TYLENOL) tablet 650 mg  650 mg Oral Q6H PRN Ival Bible, MD      . alum & mag hydroxide-simeth (MAALOX/MYLANTA) 200-200-20 MG/5ML suspension 30 mL  30 mL Oral Q4H PRN Ival Bible, MD      . cloNIDine (CATAPRES) tablet 0.1 mg  0.1 mg Oral Q4H PRN Ival Bible, MD      . dicyclomine (BENTYL) capsule 10 mg  10 mg Oral Q6H PRN Ival Bible, MD      . hydrOXYzine (ATARAX/VISTARIL) tablet 25 mg  25 mg Oral TID PRN Ival Bible, MD   25 mg at 08/18/20 1850  . loperamide (IMODIUM) capsule 2 mg  2 mg Oral Q6H PRN Ival Bible, MD      . magnesium hydroxide (MILK OF MAGNESIA) suspension 30 mL  30 mL Oral Daily PRN Ival Bible, MD      . methocarbamol (ROBAXIN) tablet 750 mg  750 mg Oral Q8H PRN Ival Bible, MD      . nicotine polacrilex (NICORETTE) gum 2 mg  2 mg Oral PRN Ival Bible, MD      . ondansetron [facility]) tablet 4 mg  4 mg Oral Q8H PRN Ival Bible, MD      . traZODone (DESYREL) tablet 50 mg  50 mg Oral QHS PRN Ival Bible, MD   50 mg at 08/18/20 1949   Current Outpatient Medications  Medication Sig Dispense Refill  . nicotine polacrilex (NICORETTE) 2 MG gum Take 1 each (2 mg total) by mouth as needed for smoking cessation. 100 tablet 0    PTA Medications: (Not in a hospital admission)   Musculoskeletal  Strength & Muscle  Tone: within normal limits Gait & Station: normal Patient leans: N/A  Psychiatric Specialty Exam  Presentation  General Appearance: Appropriate for Environment; Casual; Fairly Groomed  Eye Contact:Fair  Speech:Clear and Coherent; Normal Rate  Speech Volume:Normal  Handedness:No data recorded  Mood and Affect  Mood:Euthymic  Affect:Appropriate; Congruent   Thought Process  Thought Processes:Coherent; Goal Directed; Linear  Descriptions of Associations:Intact  Orientation:Full (Time, Place and Person)  Thought Content:WDL  Hallucinations:denies  Ideas of Reference:None  Suicidal Thoughts:Suicidal Thoughts: No  Homicidal Thoughts:Homicidal Thoughts: No   Sensorium  Memory:Immediate Good; Recent Good; Remote Fair  Judgment:Fair  Insight:Fair   Executive Functions  Concentration:Fair  Attention Span:Fair  [place]   Psychomotor Activity  Psychomotor Activity:Psychomotor Activity: Normal   Assets  Assets:Communication Skills; Desire for Improvement; Housing; Resilience; Social Support; Transportation   Sleep  Sleep:Sleep: Fair   Physical Exam  Physical Exam Constitutional:      Appearance: Normal appearance. He is normal weight.  HENT:     Head: Normocephalic and atraumatic.  Eyes:     Extraocular Movements: Extraocular movements intact.  Pulmonary:     Effort: Pulmonary effort is normal.  Neurological:     Mental Status: He is alert.    Review of Systems  Constitutional: Negative for chills and fever.  Psychiatric/Behavioral: Positive for substance abuse. Negative for hallucinations and suicidal ideas.   Blood pressure (!) 149/88, pulse 77, temperature (!) 97.5 F (36.4 C), temperature source Temporal, resp. rate 18, height '5\' 5"'  (1.651 m), weight 100.7 kg, SpO2 100 %. Body mass index is 36.94 kg/m.  Demographic Factors:  Male, Caucasian and Unemployed  Loss Factors: NA  Historical  Factors: Family history of mental illness or substance abuse  Risk Reduction Factors:   Living with another person, especially a relative and Positive social support  Continued Clinical Symptoms:  Alcohol/Substance Abuse/Dependencies  Cognitive Features That Contribute To Risk:  None    Suicide Risk:  Minimal: No identifiable suicidal ideation.  Patients presenting with no risk factors but with morbid ruminations; may be classified as  minimal risk based on the severity of the depressive symptoms  Plan Of Care/Follow-up recommendations:  Activity:  as tolerated Diet:  regular Other:      In the event of worsening symptoms, patient is instructed to call the crisis hotline, 911 and or go to the nearest ED for appropriate evaluation and treatment of symptoms. To follow-up with his/her primary care provider for your other medical issues, concerns and or health care needs.     Disposition: discharge to care of family member who will provide patient with transportation to detox at [facility] in [place].  Ival Bible, MD 08/19/2020, 10:16 AM

## 2020-08-19 NOTE — Discharge Instructions (Signed)
Please go to [facility] at the address listed below for detox.   [facility] during walk in hours for appointment with psychiatrist for further medication management.   Walk in hours are 8-11 AM Monday through Thursday. There is often a wait, and it is best to arrive by 7:30 AM.   Address:  [address] in [place], [address] Ph: [phone]

## 2020-08-19 NOTE — ED Notes (Signed)
Pt sleeping at this time. Respirations even and unlabored. Will continue to monitor for safety.

## 2020-08-19 NOTE — ED Notes (Signed)
Pt resting with eyes closed, rise and fall of chest noted. Respirations even and unlabored. No s/s of discomfort at this time, will continue to monitor for safety.

## 2020-08-21 ENCOUNTER — Ambulatory Visit (HOSPITAL_COMMUNITY)
Admission: EM | Admit: 2020-08-21 | Discharge: 2020-08-22 | Disposition: A | Discharge status: Home / Self Care | Payer: No Payment, Other | Attending: Psychiatry | Admitting: Psychiatry

## 2020-08-21 ENCOUNTER — Other Ambulatory Visit: Payer: Self-pay

## 2020-08-21 DIAGNOSIS — F1124 Opioid dependence with opioid-induced mood disorder: Secondary | ICD-10-CM | POA: Insufficient documentation

## 2020-08-21 DIAGNOSIS — F17210 Nicotine dependence, cigarettes, uncomplicated: Secondary | ICD-10-CM | POA: Insufficient documentation

## 2020-08-21 DIAGNOSIS — R45851 Suicidal ideations: Secondary | ICD-10-CM | POA: Diagnosis not present

## 2020-08-21 DIAGNOSIS — F11220 Opioid dependence with intoxication, uncomplicated: Secondary | ICD-10-CM | POA: Diagnosis not present

## 2020-08-21 DIAGNOSIS — F1994 Other psychoactive substance use, unspecified with psychoactive substance-induced mood disorder: Secondary | ICD-10-CM

## 2020-08-21 DIAGNOSIS — F1910 Other psychoactive substance abuse, uncomplicated: Secondary | ICD-10-CM | POA: Diagnosis present

## 2020-08-21 DIAGNOSIS — F1310 Sedative, hypnotic or anxiolytic abuse, uncomplicated: Secondary | ICD-10-CM

## 2020-08-21 DIAGNOSIS — F1120 Opioid dependence, uncomplicated: Secondary | ICD-10-CM | POA: Diagnosis present

## 2020-08-21 DIAGNOSIS — Z20822 Contact with and (suspected) exposure to covid-19: Secondary | ICD-10-CM | POA: Insufficient documentation

## 2020-08-21 DIAGNOSIS — F1320 Sedative, hypnotic or anxiolytic dependence, uncomplicated: Secondary | ICD-10-CM | POA: Diagnosis present

## 2020-08-21 LAB — POCT URINE DRUG SCREEN - MANUAL ENTRY (I-SCREEN)
POC Amphetamine UR: NOT DETECTED
POC Buprenorphine (BUP): NOT DETECTED
POC Cocaine UR: NOT DETECTED
POC Marijuana UR: POSITIVE — AB
POC Methadone UR: NOT DETECTED
POC Methamphetamine UR: NOT DETECTED
POC Morphine: POSITIVE — AB
POC Oxazepam (BZO): POSITIVE — AB
POC Oxycodone UR: POSITIVE — AB
POC Secobarbital (BAR): NOT DETECTED

## 2020-08-21 LAB — POC SARS CORONAVIRUS 2 AG -  ED: SARS Coronavirus 2 Ag: NEGATIVE

## 2020-08-21 LAB — POC SARS CORONAVIRUS 2 AG: SARS Coronavirus 2 Ag: NEGATIVE

## 2020-08-21 MED ORDER — ACETAMINOPHEN 325 MG PO TABS: 650.0000 mg | ORAL_TABLET | Freq: Four times a day (QID) | ORAL | Status: DC | PRN

## 2020-08-21 MED ORDER — TRAZODONE HCL 50 MG PO TABS
50.0000 mg | ORAL_TABLET | Freq: Every evening | ORAL | Status: DC | PRN
  Administered 2020-08-21: 50 mg via ORAL
  Filled 2020-08-21: qty 1

## 2020-08-21 MED ORDER — HYDROXYZINE HCL 25 MG PO TABS
25.0000 mg | ORAL_TABLET | Freq: Three times a day (TID) | ORAL | Status: DC | PRN
  Administered 2020-08-21: 25 mg via ORAL
  Filled 2020-08-21: qty 1

## 2020-08-21 MED ORDER — MAGNESIUM HYDROXIDE 400 MG/5ML PO SUSP: 30.0000 mL | Freq: Every day | ORAL | Status: DC | PRN

## 2020-08-21 MED ORDER — FLUOXETINE HCL 20 MG PO CAPS
20.0000 mg | ORAL_CAPSULE | Freq: Every day | ORAL | Status: DC
  Administered 2020-08-21 – 2020-08-22 (×2): 20 mg via ORAL
  Filled 2020-08-21 (×2): qty 1

## 2020-08-21 MED ORDER — ALUM & MAG HYDROXIDE-SIMETH 200-200-20 MG/5ML PO SUSP: 30.0000 mL | ORAL | Status: DC | PRN

## 2020-08-21 NOTE — ED Triage Notes (Signed)
Pt presents to [facility] endorsing SI/HI and AVH. Pt states that he d/c from [facility] earlier this week and went to detox. His aunt got him out today because she said her son was in a bad wreck. Pt was under the impression that his cousin was not going to make, however, he just had a broken leg and was ok. Pt reports he is HI towards his aunt for lying just to "use me to procure drugs for her." Pt report using "heroine and benzos" today.

## 2020-08-21 NOTE — BH Assessment (Signed)
Comprehensive Clinical Assessment (CCA) Note  08/21/2020 Ryan Kelley [id number]    Patient presents to [facility] after just having been discharged to [facility] in [place] on 08/19/20.  Patient states that while in detox that he received a call from his aunt that his cousin had been in a wreck and was in bad shape and that he needed to come and see him.  Patient states that he left treatment only to find out that his aunt had him leave of a rouse because she needed him to get "procure drugs for her" because his cousin who is in the hospital is the one who usually gets them for her.  He states that he is now sucidal with a plan to hang himself or slit his wrists and states that he is homicidal towars his aunt, but he did not identify any plan.  Finally, he states that he is hearing voices telling him tto kill himself.  Patient was at [facility] prior to coming to the [facility].  Patient appears to be jumpting from one facility to another, leaving each one AMA and he appears to be treatment savy and familiar with what to say in order to gain admission to facility for what appears to be a secondary gain.  TTS contacted [facility] where patient just left and they indicated that patient left AMA primarily they think because he wanted to smoke.  There was no mention of his cousin being in a wreck.  Patient admits to using 1/2 gram of heroin today and states that he took 1.5 mg of Xanax today as well.  Patient presents as oriented and alert.  He is extremely anxious.  His judgement, insight and impulse control are characteristically impaired due to his drug use.  He does not appear to be responding to any internal stimuli.  His thoughts appear to be organized and his memory is intact.  Per Dr. Einar Grad Discharge Note dates 08/19/2020:  Subjective: Patient interviewed this AM bedside. He is calm, cooperative and pleasant. He states that he slept well and expressed that it was the best night's sleep he has had in  awhile. He describes his mood as "ok". He denies SI/HI/AVH. He expresses interest in detox for substance use-reported . Discussed [facility] with patient; informed patient that it is a walk in facility and that if he is denied he will need his own ride home. Patient verbalized understanding and stated that he will call his family to pick him up from the [facility] and take him to the detox facility . Pt states that he does not have any home medications but requests an rx for nicorette gum in the event that the detox is a non smoking facility.   On my interview, patient is in NAD, alert, oriented, calm, cooperative, and attentive, with normal affect, speech, and behavior. Objectively, there is no evidence of psychosis/ mania (able to converse coherently, linear and goal directed thought, no RIS, no distractibility, not pre-occupied, no FOI, etc) nor depression to the point of suicidality (able to concentrate, affect full and reactive, speech normal r/v/t, no psychomotor retardation/agitation, etc).  Overall, patient appears to be at the point, in the absence of inhibiting or disinhibiting symptoms, where he can successfully move to lesser restrictive setting for care.    Stay Summary:  35 yo male with h/o depression and substance abuse (bzd and opioids) who presented voluntarily to the [facility] accompanied by his mother with SI and AVH. He states that he presented  today because he was having "a severe mental health crisis". He states that he has been having SI intermittently for months but that the last 3-5 days it has increased in frequency and severity. He states he currently has a plan to overdose or to hang himself. He states that he was at [facility] for the last 3 days but did not complete detox from substances first and therefore began to go into withdrawal. He states that he normally 5-20 pills of xanax and klonopin daily, unable to quantify milligrams used since he purchases whatever he is able to  obtain off the street; he last used benzos yesterday. He states that prior to coming into the [facility] with his mother, he told her he needed to stop at a gas station for a snack;however, he met someone to purchase heroin from. He states his last use of heroin was prior to presentation; used approximately 1 gram. He states that he is currently experiencing CAH instructing him to harm Himself and others as well as VH of demons; he states that hallucinations tend to occur in context of periods of time when not using benzos. He reports benzo withdrawal sx of hallucinations, increased anxiety. He denies opioid withdrawal sx of nausea, vomiting, diarrhea, lacrimation, rhinorrhea, diarrhea. He states that his mother observed him "talking to someone who wasn't there" prior to presentation to the [facility]. UDS+BZD and MJ. Patient unable to contract for safety. See TTS note for collateral. Patient is amenable for overnight observation. Discussed treating symptomatic benzo withdrawal; he received one time dose of librium 25 mg for BZD withdrawal and PRNs for opioid withdrawal were ordered. Patient was observed overnight and the following morning patient denied SI/HI/AVH and expressed interest in detox- see above for details.    Chief Complaint:  Chief Complaint  Patient presents with  . Homicidal  . Addiction Problem  . Suicidal   Final diagnoses:  Substance induced mood disorder (HCC)  Opioid abuse, continuous (HCC)  Benzodiazepine abuse (HCC)     CCA Screening, Triage and Referral (STR)  Patient Reported Information How did you hear about [place]? Self  Referral name: Ryan Kelley  Referral phone number: No data recorded  Whom do you see for routine medical problems? I don't have a doctor ([place] 08/21/2020)  Practice/Facility Name: No data recorded Practice/Facility Phone Number: No data recorded Name of Contact: No data recorded Contact Number: No data recorded Contact Fax Number: No data  recorded Prescriber Name: No data recorded Prescriber Address (if known): No data recorded  What Is the Reason for Your Visit/Call Today? Mental Health Crisis. Detox As Well (Phreesia 08/21/2020)  How Long Has This Been Causing You Problems? > than 6 months (Phreesia 08/21/2020)  What Do You Feel Would Help You the Most Today? Other (Comment) (Phreesia 08/21/2020)   Have You Recently Been in Any Inpatient Treatment (Hospital/Detox/Crisis Center/28-Day Program)? Yes (Phreesia 08/21/2020)  Name/Location of Program/Hospital:[facility] (Phreesia 08/21/2020)  How Long Were You There? 3 Days (Phreesia 08/21/2020)  When Were You Discharged? No data recorded  Have You Ever Received Services From [facility] Before? Yes (Phreesia 08/21/2020)  Who Do You See at [facility]? This Urgent Care Facility ([place] 08/21/2020)   Have You Recently Had Any Thoughts About Hurting Yourself? Yes (Phreesia 08/21/2020)  Are You Planning to Commit Suicide/Harm Yourself At This time? Yes (Phreesia 08/21/2020)   Have you Recently Had Thoughts About [place]? Yes (Phreesia 08/21/2020)  Explanation: I Thought About Harming My Aunt For  Lies She Recently Told Me (Phreesia 08/21/2020)   Have You Used Any Alcohol or Drugs in the Past 24 Hours? Yes (Phreesia 08/21/2020)  How Long Ago Did You Use Drugs or Alcohol? 5361  What Did You Use and How Much? Heroin .5 Grams...klonopins About 3.5 Mg (Phreesia 08/21/2020)   Do You Currently Have a Therapist/Psychiatrist? No (Phreesia 08/21/2020)  Name of Therapist/Psychiatrist: No data recorded  Have You Been Recently Discharged From Any Office Practice or Programs? Yes (Phreesia 08/21/2020)  Explanation of Discharge From Practice/Program: My Aunt Lied About My [place] To Persuade Me To  [facility]charge [facility] For Her (Phreesia 08/21/2020)     CCA Screening Triage Referral  Assessment Type of Contact: Face-to-Face  Is this Initial or Reassessment? Initial Assessment  Date Telepsych consult ordered in [facility]:  05/28/2020  Time Telepsych consult ordered in [facility]:  1953   Patient Reported Information Reviewed? Yes  Patient Left Without Being Seen? No data recorded Reason for Not Completing Assessment: No data recorded  Collateral Involvement: none reported   Does Patient Have a [place]? No data recorded Name and Contact of Legal Guardian: Self  If Minor and Not Living with Parent(s), Who has Custody? n/a  Is CPS involved or ever been involved? Never  Is APS involved or ever been involved? Never   Patient Determined To Be At Risk for Harm To Self or Others Based on Review of Patient Reported Information or Presenting Complaint? Yes, for Self-Harm  Method: No data recorded Availability of Means: No data recorded Intent: No data recorded Notification Required: No data recorded Additional Information for Danger to Others Potential: No data recorded Additional Comments for Danger to Others Potential: No data recorded Are There Guns or Other Weapons in Your Home? No data recorded Types of Guns/Weapons: No data recorded Are These Weapons Safely Secured?                            No data recorded Who Could Verify You Are Able To Have These Secured: No data recorded Do You Have any Outstanding Charges, Pending Court Dates, Parole/Probation? No data recorded Contacted To Inform of Risk of Harm To Self or Others: No data recorded  Location of Assessment: [facility] Assessment Services   Does Patient Present under Involuntary Commitment? No  IVC Papers Initial File Date: No data recorded  [place] of Residence: [place]   Patient Currently Receiving the Following Services: CD--IOP (Intensive Chemical Dependency Program); SAIOP (Substance Abuse Intensive Outpatient Program   Determination of Need: Emergent (2 hours)   Options For  Referral: Chemical Dependency Intensive Outpatient Therapy (CDIOP); Inpatient Hospitalization; Medication Management; Outpatient Therapy     CCA Biopsychosocial Intake/Chief Complaint:  Patient presents to [facility] after just having been discharged to [facility] in [place] on 08/19/20.  Patient states that while in detox that he received a call from his aunt that his cousin had been in a wreck and was in bad shape and that he needed to come and see him.  Patient states that he left treatment only to find out that his aunt had him leave of a rouse because she needed him to get "procure drugs for her" because his cousin who is in the hospital is the one who usually gets them for her.  He states that he is now sucidal with a plan to hang himself or slit his wrists and states that he  is homicidal towars his aunt, but he did not identify any plan.  Finally, he states that he is hearing voices telling him tto kill himself.  Patient was at [facility] prior to coming to the [facility].  Patient appears to be jumpting from one facility to another, leaving each one AMA and he appears to be treatment savy and familiar with what to say in order to gain admission to facility for what appears to be a secondary gain.  TTS contacted [facility] where patient just left and they indicated that patient left AMA primarily they think because he wanted to smoke.  There was no mention of his cousin being in a wreck.  Patient admits to using 1/2 gram of heroin today and states that he took 1.5 mg of Xanax today as well.  Patient presents as oriented and alert.  He is extremely anxious.  His judgement, insight and impulse control are characteristically impaired due to his drug use.  He does not appear to be responding to any internal stimuli.  His thoughts appear to be organized and his memory is intact.  Current Symptoms/Problems: Patient reports current depressive symptoms of anhedonia, crying spells, irritation, poor sleep and  appetite, feeling helpless, hopeless and worthless. AVH, SI,   Patient Reported Schizophrenia/Schizoaffective Diagnosis in Past: No data recorded  Strengths: UTA  Preferences: UTA  Abilities: UTA   Type of Services Patient Feels are Needed: Patient is requesting a referral to a detox facility   Initial Clinical Notes/Concerns: No data recorded  Mental Health Symptoms Depression:  Hopelessness; Increase/decrease in appetite; Irritability; Sleep (too much or little); Tearfulness; Worthlessness   Duration of Depressive symptoms: Greater than two weeks   Mania:  N/A   Anxiety:   Worrying; Irritability   Psychosis:  Hallucinations   Duration of Psychotic symptoms: Less than six months   Trauma:  N/A   Obsessions:  N/A   Compulsions:  N/A   Inattention:  N/A   Hyperactivity/Impulsivity:  No data recorded  Oppositional/Defiant Behaviors:  N/A   Emotional Irregularity:  N/A   Other Mood/Personality Symptoms:  No data recorded   Mental Status Exam Appearance and self-care  Stature:  Average   Weight:  Average weight   Clothing:  Disheveled   Grooming:  Normal   Cosmetic use:  None   Posture/gait:  Tense   Motor activity:  Agitated   Sensorium  Attention:  Normal   Concentration:  Normal   Orientation:  Place; Person; Situation; Time; Object   Recall/memory:  Normal   Affect and Mood  Affect:  Anxious   Mood:  Anxious   Relating  Eye contact:  Normal   Facial expression:  Tense   Attitude toward examiner:  Cooperative   Thought and Language  Speech flow: Soft; Slurred   Thought content:  Appropriate to Mood and Circumstances   Preoccupation:  Suicide   Hallucinations:  Auditory; Command (Comment); Visual   Organization:  No data recorded  [employer] of Knowledge:  Fair   Intelligence:  Average   Abstraction:  Normal   Judgement:  Fair   Art therapist:  Adequate   Insight:  Fair   Decision Making:  Normal    Social Functioning  Social Maturity:  Impulsive   Social Judgement:  "Games developer"   Stress  Stressors:  Housing; Museum/gallery curator; Relationship   Coping Ability:  Overwhelmed; Exhausted   Skill Deficits:  Decision making; Responsibility   Supports:  Family  Religion: Religion/Spirituality Are You A Religious Person?:  (not assessed)  Leisure/Recreation: Leisure / Recreation Do You Have Hobbies?: No  Exercise/Diet: Exercise/Diet Do You Exercise?: No Have You Gained or Lost A Significant Amount of Weight in the Past Six Months?: No Do You Follow a Special Diet?: No Do You Have Any Trouble Sleeping?: Yes Explanation of Sleeping Difficulties: unable to sleep   CCA Employment/Education Employment/Work Situation: Employment / Work Situation Employment situation: Unemployed Patient's job has been impacted by current illness: No What is the longest time patient has a held a job?: 3 years Where was the patient employed at that time?: [employer] Has patient ever been in the [employer]?: No  Education: Education Is Patient Currently Attending School?: No Name of [employer] School: not assessed Did Teacher, adult education From [employer]?:  (not assessed) Did Physicist, medical?:  (not assessed) Did [place]?:  (not assessed) Did You Have An Individualized Education Program (IIEP): No Did You Have Any Difficulty At School?: No Patient's Education Has Been Impacted by Current Illness: No   CCA Family/Childhood History Family and Relationship History: Family history Marital status: Married Number of Years Married:  (not assessed) What types of issues is patient dealing with in the relationship?: UTA Additional relationship information: UTA What is your sexual orientation?: UTA Has your sexual activity been affected by drugs, alcohol, medication, or emotional stress?: UTA Does patient have children?: No  Childhood History:  Childhood History By whom was/is the  patient raised?: Mother,Father Additional childhood history information: Pt states that he had a good childhood.  Description of patient's relationship with caregiver when they were a child: Pt reports getting along well with parents growing up.  How were you disciplined when you got in trouble as a child/adolescent?: Patient denies any history of abuse and states that he was disciplined appropriately Does patient have siblings?: Yes Number of Siblings: 1 Description of patient's current relationship with siblings: Brother is deceased Did patient suffer any verbal/emotional/physical/sexual abuse as a child?: No Did patient suffer from severe childhood neglect?: No Has patient ever been sexually abused/assaulted/raped as an adolescent or adult?: No Was the patient ever a victim of a crime or a disaster?: No Witnessed domestic violence?: No Has patient been affected by domestic violence as an adult?: No  Child/Adolescent Assessment:     CCA Substance Use Alcohol/Drug Use: Alcohol / Drug Use Pain Medications: See MAR Prescriptions: See MAR Over the Counter: See MAR History of alcohol / drug use?: Yes Longest period of sobriety (when/how long): unknown Negative Consequences of Use: Financial,Personal relationships Withdrawal Symptoms: Agitation,Irritability,Diarrhea,Nausea / Vomiting Substance #1 Name of Substance 1: Heroin 1 - Age of First Use: 32 1 - Amount (size/oz): 1 gram 1 - Frequency: daily 1 - Duration: ongoing 1 - Last Use / Amount: 08/18/2020 Substance #2 Name of Substance 2: benzodiazepines 2 - Age of First Use: 10 2 - Amount (size/oz): 5-20 pills daily 2 - Frequency: daily 2 - Duration: ongoing 2 - Last Use / Amount: Sunday 08/17/2020                     ASAM's:  Six Dimensions of Multidimensional Assessment  Dimension 1:  Acute Intoxication and/or Withdrawal Potential:   Dimension 1:  Description of individual's past and current experiences of substance  use and withdrawal: Patient states that he has complications with withdrawal symptoms and requires medically monitored detox  Dimension 2:  Biomedical Conditions and Complications:   Dimension 2:  Description of patient's biomedical conditions and  complications: Patient is using a veryu dangerous combination of drugs that place him at high [place] for overdose  Dimension 3:  Emotional, Behavioral, or Cognitive Conditions and Complications:  Dimension 3:  Description of emotional, behavioral, or cognitive conditions and complications: Patient has very poor coping mechanisms and uses drugs to self-medicate his emotional and mental health issues  Dimension 4:  Readiness to Change:  Dimension 4:  Description of Readiness to Change criteria: Patient appears to has reservations and has been jumping from treatment center to treatment center and leaving AMA and using the same day he is discharged  Dimension 5:  Relapse, Continued use, or Continued Problem Potential:  Dimension 5:  Relapse, continued use, or continued problem potential critiera description: Patient has a history of chronic relapses and failed treatment episodes  Dimension 6:  Recovery/Living Environment:  Dimension 6:  Recovery/Iiving environment criteria description: Patient is essentially homeless at this point and he has exhausted most all of his support from others  ASAM Severity Score: ASAM's Severity Rating Score: 15  ASAM Recommended Level of Treatment: ASAM Recommended Level of Treatment: Level III Residential Treatment   Substance use Disorder (SUD) Substance Use Disorder (SUD)  Checklist Symptoms of Substance Use: Continued use despite having a persistent/recurrent physical/psychological problem caused/exacerbated by use,Continued use despite persistent or recurrent social, interpersonal problems, caused or exacerbated by use,Evidence of tolerance,Evidence of withdrawal (Comment),Large amounts of time spent to obtain, use or recover from  the substance(s),Persistent desire or unsuccessful efforts to cut down or control use,Recurrent use that results in a failure to fulfill major role obligations (work, school, home),Presence of craving or strong urge to use,Social, occupational, recreational activities given up or reduced due to use,Substance(s) often taken in larger amounts or over longer times than was intended  Recommendations for Services/Supports/Treatments: Recommendations for Services/Supports/Treatments Recommendations For Services/Supports/Treatments: Detox,Residential-Level 3  DSM5 Diagnoses: Patient Active Problem List   Diagnosis Date Noted  . Polysubstance abuse ([place]) 08/21/2020  . Benzodiazepine abuse ([place]) 05/27/2020  . 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 Opioid withdrawal ([place]) 07/08/2016  . Substance induced mood disorder ([place]) 12/05/2013  . Opioid dependence ([place]) 10/18/2012  . Opioid use with withdrawal ([place]) 10/18/2012    Disposition:  Per Shuvon Rankin, NP, patient is recommended for Continuous Assessment and he will be re-evaluated in the morning.   Referrals to Alternative Service(s): Referred to Alternative Service(s):   Place:   Date:   Time:    Referred to Alternative Service(s):   Place:   Date:   Time:    Referred to Alternative Service(s):   Place:   Date:   Time:    Referred to Alternative Service(s):   Place:   Date:   Time:     Jonice Cerra J Kiaya [place], LCAS

## 2020-08-21 NOTE — ED Notes (Signed)
Pt belongings in locker #13.  

## 2020-08-21 NOTE — ED Provider Notes (Signed)
Behavioral Health Admission H&P [facility](FBC & OBS)  Date: 08/21/20 Patient Name: Ryan Kelley MRN: [id number] Chief Complaint:  Chief Complaint  Patient presents with  . Homicidal  . Addiction Problem  . Suicidal      Diagnoses:  Final diagnoses:  None    HPI: Ryan Kelley, 35 y.o., male [facility]  As a walk in with complaints of suicidal ideation and command auditory hallucinations.  [facility] and [facility] where he [facility].  Patient lied stating that [facility] that he had to see his cousin who was in a accident and may die; and could present back in 24 hours but after speaking with [facility] patient told them he did not need their services and left.  Patient admitted to Continuous Assessment unit for stabilization.  Patient given information for rehab facilities so he can call and check bed availability.  Patient referred to the [facility] program and has been entered on his AVS.    PHQ 2-9:  Flowsheet Row [facility]  Thoughts that you would be better off dead, or of hurting yourself in some way Nearly every day  [Phreesia 08/21/2020] Nearly every day  [Phreesia 08/18/2020]  PHQ-9 Total Score 27 27      Flowsheet Row ED [facility]27/2020 [facility]  C-SSRS RISK CATEGORY High Risk High Risk High Risk       Total Time spent with patient: 30 minutes  Musculoskeletal  Strength & Muscle Tone: within normal limits Gait & Station: normal Patient leans: N/A  Psychiatric Specialty Exam  Presentation General Appearance: Appropriate for Environment; Casual  Eye Contact:Good  Speech:Normal Rate  Speech  Volume:Normal  Handedness:Right   Mood and Affect  Mood:Anxious  Affect:Congruent   Thought Process  Thought Processes:Coherent; Goal Directed  Descriptions of Associations:Intact  Orientation:Full (Time, Place and Person)  Thought Content:WDL  Hallucinations:Hallucinations: Auditory Description of Command Hallucinations: Voices telling him to kill himself and his aunt Description of Auditory Hallucinations: Voices telling him to kill himself and his aunt  Ideas of Reference:Paranoia  Suicidal Thoughts:SI Active Intent and/or Plan: With Plan; Without Intent (States he will overdose, cut himself, or hang himself)  Homicidal Thoughts:Homicidal Thoughts: Yes, Passive (Thoughts of killing his aunt for telling him a lie and getting him out of rehab to buy her drugs that he did with her) HI Passive Intent and/or Plan: Without Intent; Without Plan   Sensorium  Memory:Immediate Good; Immediate Poor  Judgment:Intact  Insight:Fair   Executive Functions  Concentration:Good  Attention Span:Good  Recall:Good  Fund of Knowledge:Good  Language:Good   Psychomotor Activity  Psychomotor Activity:Psychomotor Activity: Normal   Assets  Assets:Communication Skills; Desire for Improvement   Sleep  Sleep:Sleep: Good   Physical Exam Vitals and nursing note reviewed. Exam conducted with a chaperone present.  Constitutional:      General: He is not in acute distress.    Appearance: Normal appearance. He is not ill-appearing.  HENT:     Head: Normocephalic.  Eyes:     Pupils: Pupils are equal, round, and reactive to light.  Cardiovascular:     Rate and Rhythm: Normal rate.  Pulmonary:     Effort: Pulmonary effort is normal.  Musculoskeletal:        General: Normal range of motion.     Cervical back: Normal range of motion.  Skin:    General: Skin is warm and dry.  Neurological:     Mental Status: He is alert and oriented to person, place, and time.   Psychiatric:        Attention and Perception: He does not perceive auditory hallucinations.        Mood and Affect: Mood is anxious and depressed. Affect is labile.        Speech: Speech normal.        Behavior: Behavior normal. Behavior is cooperative.        Thought Content: Thought content is not paranoid or delusional. Thought content includes homicidal and suicidal ideation. Thought content includes suicidal plan.        Cognition and Memory: Cognition and memory normal.        Judgment: Judgment is impulsive.    Review of Systems  Constitutional: Negative.   HENT: Negative.   Eyes: Negative.   Respiratory: Negative.   Cardiovascular: Negative.   Gastrointestinal: Negative.   Genitourinary: Negative.   Musculoskeletal: Negative.   Skin: Negative.   Neurological: Negative.   Endo/Heme/Allergies: Negative.   Psychiatric/Behavioral: Positive for depression, hallucinations, substance abuse and suicidal ideas. The patient is nervous/anxious.     Pulse (!) 104, temperature 97.8 F (36.6 C), temperature source Oral, resp. rate 20, SpO2 96 %. There is no height or weight on file to calculate BMI.  Past Psychiatric History: See above   Is the patient at risk to self? Yes  Has the patient been a risk to self in the past 6 months? Yes .    Has the patient been a risk to self within the distant past? No   Is the patient a risk to others? No   Has the patient been a risk to others in the past 6 months? No   Has the patient been a risk to others within the distant past? No   Past Medical History:  Past Medical History:  Diagnosis Date  . Anxiety   . Attempted suicide (HCC)   . Chronic back pain   . Chronic knee pain   . Depression   . Insomnia   . PTSD (post-traumatic stress disorder)   . Substance abuse (HCC)    No past surgical history on file.  Family History:  Family History  Problem Relation Age of Onset  . Diabetes Mother   . Cancer Mother   . Hypertension Mother    . Hypertension Father     Social History:  Social History   Socioeconomic History  . Marital status: Single    Spouse name: Not on file  . Number of children: Not on file  . Years of education: Not on file  . Highest education level: Not on file  Occupational History  . Not on file  Tobacco Use  . Smoking status: Current Every Day Smoker    Packs/day: 1.00    Years: 7.00    Pack years: 7.00    Types: Cigarettes  . Smokeless tobacco: Never Used  Substance and Sexual Activity  . Alcohol use: Yes    Comment: occasionally   . Drug use: Yes    Frequency: 5.0 times per week    Types: Hydrocodone, Oxycodone, Benzodiazepines, Marijuana    Comment: marijuana last used 07/06/16, heroin last used 07/07/16  . Sexual activity: Not on file  Other  Topics Concern  . Not on file  Social History Narrative   ** Merged History Encounter **       Social Determinants of Health   Financial Resource Strain: Not on file  Food Insecurity: Not on file  Transportation Needs: Not on file  Physical Activity: Not on file  Stress: Not on file  Social Connections: Not on file  Intimate Partner Violence: Not on file    SDOH:  SDOH Screenings   Alcohol Screen: Not on file  Depression (PHQ2-9): Medium Risk  . PHQ-2 Score: 27  Financial Resource Strain: Not on file  Food Insecurity: Not on file  Housing: Not on file  Physical Activity: Not on file  Social Connections: Not on file  Stress: Not on file  Tobacco Use: High Risk  . Smoking Tobacco Use: Current Every Day Smoker  . Smokeless Tobacco Use: Never Used  Transportation Needs: Not on file    Last Labs:  Admission on 08/18/2020, Discharged on 08/19/2020  Component Date Value Ref Range Status  . SARS Coronavirus 2 by RT PCR 08/18/2020 NEGATIVE  NEGATIVE Final   Comment: (NOTE) SARS-CoV-2 target nucleic acids are NOT DETECTED.  The SARS-CoV-2 RNA is generally detectable in upper respiratory specimens during the acute phase of  infection. The lowest concentration of SARS-CoV-2 viral copies this assay can detect is 138 copies/mL. A negative result does not preclude SARS-Cov-2 infection and should not be used as the sole basis for treatment or other patient management decisions. A negative result may occur with  improper specimen collection/handling, submission of specimen other than nasopharyngeal swab, presence of viral mutation(s) within the areas targeted by this assay, and inadequate number of viral copies(<138 copies/mL). A negative result must be combined with clinical observations, patient history, and epidemiological information. The expected result is Negative.  Fact Sheet for Patients:  BloggerCourse.com  Fact Sheet for Healthcare Providers:  SeriousBroker.it  This test is no                          t yet approved or cleared by the [place] FDA and  has been authorized for detection and/or diagnosis of SARS-CoV-2 by FDA under an Emergency Use Authorization (EUA). This EUA will remain  in effect (meaning this test can be used) for the duration of the COVID-19 declaration under Section 564(b)(1) of the Act, 21 U.S.C.section 360bbb-3(b)(1), unless the authorization is terminated  or revoked sooner.      . Influenza A by PCR 08/18/2020 NEGATIVE  NEGATIVE Final  . Influenza B by PCR 08/18/2020 NEGATIVE  NEGATIVE Final   Comment: (NOTE) The Xpert Xpress SARS-CoV-2/FLU/RSV plus assay is intended as an aid in the diagnosis of influenza from Nasopharyngeal swab specimens and should not be used as a sole basis for treatment. Nasal washings and aspirates are unacceptable for Xpert Xpress SARS-CoV-2/FLU/RSV testing.  Fact Sheet for Patients: BloggerCourse.com  Fact Sheet for Healthcare Providers: SeriousBroker.it  This test is not yet approved or cleared by the [place] FDA and has been  authorized for detection and/or diagnosis of SARS-CoV-2 by FDA under an Emergency Use Authorization (EUA). This EUA will remain in effect (meaning this test can be used) for the duration of the COVID-19 declaration under Section 564(b)(1) of the Act, 21 U.S.C. section 360bbb-3(b)(1), unless the authorization is terminated or revoked.  Performed at [facility], [address], [place], [place] [address]   . Sodium 08/18/2020 132* 135 - 145  mmol/L Final  . Potassium 08/18/2020 4.1  3.5 - 5.1 mmol/L Final  . Chloride 08/18/2020 97* 98 - 111 mmol/L Final  . CO2 08/18/2020 23  22 - 32 mmol/L Final  . Glucose, Bld 08/18/2020 76  70 - 99 mg/dL Final   Glucose reference range applies only to samples taken after fasting for at least 8 hours.  . BUN 08/18/2020 16  6 - 20 mg/dL Final  . Creatinine, Ser 08/18/2020 1.18  0.61 - 1.24 mg/dL Final  . Calcium 75/64/3329 9.0  8.9 - 10.3 mg/dL Final  . Total Protein 08/18/2020 6.8  6.5 - 8.1 g/dL Final  . Albumin 51/88/4166 4.4  3.5 - 5.0 g/dL Final  . AST 02/06/1600 26  15 - 41 U/L Final  . ALT 08/18/2020 28  0 - 44 U/L Final  . Alkaline Phosphatase 08/18/2020 71  38 - 126 U/L Final  . Total Bilirubin 08/18/2020 0.6  0.3 - 1.2 mg/dL Final  . GFR, Estimated 08/18/2020 >60  >60 mL/min Final   Comment: (NOTE) Calculated using the CKD-EPI Creatinine Equation (2021)   . Anion gap 08/18/2020 12  5 - 15 Final   Performed at [facility], [address] [place], [place] [address]  . WBC 08/18/2020 12.1* 4.0 - 10.5 K/uL Final  . RBC 08/18/2020 4.88  4.22 - 5.81 MIL/uL Final  . Hemoglobin 08/18/2020 15.4  13.0 - 17.0 g/dL Final  . HCT 55/73/2202 42.8  39.0 - 52.0 % Final  . MCV 08/18/2020 87.7  80.0 - 100.0 fL Final  . MCH 08/18/2020 31.6  26.0 - 34.0 pg Final  . MCHC 08/18/2020 36.0  30.0 - 36.0 g/dL Final  . RDW 54/27/0623 13.1  11.5 - 15.5 % Final  . Platelets 08/18/2020 282  150 - 400 K/uL Final  . nRBC 08/18/2020 0.0  0.0 - 0.2 % Final   . Neutrophils Relative % 08/18/2020 68  % Final  . Neutro Abs 08/18/2020 8.2* 1.7 - 7.7 K/uL Final  . Lymphocytes Relative 08/18/2020 23  % Final  . Lymphs Abs 08/18/2020 2.8  0.7 - 4.0 K/uL Final  . Monocytes Relative 08/18/2020 7  % Final  . Monocytes Absolute 08/18/2020 0.8  0.1 - 1.0 K/uL Final  . Eosinophils Relative 08/18/2020 1  % Final  . Eosinophils Absolute 08/18/2020 0.2  0.0 - 0.5 K/uL Final  . Basophils Relative 08/18/2020 1  % Final  . Basophils Absolute 08/18/2020 0.1  0.0 - 0.1 K/uL Final  . Immature Granulocytes 08/18/2020 0  % Final  . Abs Immature Granulocytes 08/18/2020 0.05  0.00 - 0.07 K/uL Final   Performed at [facility], [address], [place], [place] [address]  . Hgb A1c MFr Bld 08/18/2020 5.1  4.8 - 5.6 % Final   Comment: (NOTE) Pre diabetes:          5.7%-6.4%  Diabetes:              >6.4%  Glycemic control for   <7.0% adults with diabetes   . Mean Plasma Glucose 08/18/2020 99.67  mg/dL Final   Performed at [facility], [address], [place], [place] [address]  . Alcohol, Ethyl (B) 08/18/2020 <10  <10 mg/dL Final   Comment: (NOTE) Lowest detectable limit for serum alcohol is 10 mg/dL.  For medical purposes only. Performed at [facility], [address], [place], [place] [address]   . Cholesterol 08/18/2020 258* 0 - 200 mg/dL  Final  . Triglycerides 08/18/2020 241* <150 mg/dL Final  . HDL 09/81/1914 39* >40 mg/dL Final  . Total CHOL/HDL Ratio 08/18/2020 6.6  RATIO Final  . VLDL 08/18/2020 48* 0 - 40 mg/dL Final  . LDL Cholesterol 08/18/2020 171* 0 - 99 mg/dL Final   Comment:        Total Cholesterol/HDL:CHD Risk Coronary Heart Disease Risk Table                     Men   Women  1/2 Average Risk   3.4   3.3  Average Risk       5.0   4.4  2 X Average Risk   9.6   7.1  3 X Average Risk  23.4   11.0        Use the calculated Patient Ratio above and the CHD Risk Table to determine the patient's CHD Risk.        ATP III  CLASSIFICATION (LDL):  <100     mg/dL   Optimal  782-956  mg/dL   Near or Above                    Optimal  130-159  mg/dL   Borderline  213-086  mg/dL   High  >578     mg/dL   Very High Performed at [facility], [address], [place], [place] [address]   . TSH 08/18/2020 3.403  0.350 - 4.500 uIU/mL Final   Comment: Performed by a 3rd Generation assay with a functional sensitivity of <=0.01 uIU/mL. Performed at [facility], [address], [place], [place] [address]   . POC Amphetamine UR 08/18/2020 None Detected  NONE DETECTED (Cut Off Level 1000 ng/mL) Final  . POC Secobarbital (BAR) 08/18/2020 None Detected  NONE DETECTED (Cut Off Level 300 ng/mL) Final  . POC Buprenorphine (BUP) 08/18/2020 None Detected  NONE DETECTED (Cut Off Level 10 ng/mL) Final  . POC Oxazepam (BZO) 08/18/2020 Positive* NONE DETECTED (Cut Off Level 300 ng/mL) Final  . POC Cocaine UR 08/18/2020 None Detected  NONE DETECTED (Cut Off Level 300 ng/mL) Final  . POC Methamphetamine UR 08/18/2020 None Detected  NONE DETECTED (Cut Off Level 1000 ng/mL) Final  . POC Morphine 08/18/2020 None Detected  NONE DETECTED (Cut Off Level 300 ng/mL) Final  . POC Oxycodone UR 08/18/2020 None Detected  NONE DETECTED (Cut Off Level 100 ng/mL) Final  . POC Methadone UR 08/18/2020 None Detected  NONE DETECTED (Cut Off Level 300 ng/mL) Final  . POC Marijuana UR 08/18/2020 Positive* NONE DETECTED (Cut Off Level 50 ng/mL) Final  . SARS Coronavirus 2 Ag 08/18/2020 Negative  Negative Final  . SARS Coronavirus 2 Ag 08/18/2020 NEGATIVE  NEGATIVE Final   Comment: (NOTE) SARS-CoV-2 antigen NOT DETECTED.   Negative results are presumptive.  Negative results do not preclude SARS-CoV-2 infection and should not be used as the sole basis for treatment or other patient management decisions, including infection  control decisions, particularly in the presence of clinical signs and  symptoms consistent with COVID-19, or in those who  have been in contact with the virus.  Negative results must be combined with clinical observations, patient history, and epidemiological information. The expected result is Negative.  Fact Sheet for Patients: https://sanders-williams.net/  Fact Sheet for Healthcare Providers: https://martinez.com/   This test is not yet approved or cleared by the [place] FDA and  has been authorized for detection and/or diagnosis  of SARS-CoV-2 by FDA under an Emergency Use Authorization (EUA).  This EUA will remain in effect (meaning this test can be used) for the duration of  the C                          OVID-19 declaration under Section 564(b)(1) of the Act, 21 U.S.C. section 360bbb-3(b)(1), unless the authorization is terminated or revoked sooner.    Admission on 05/28/2020, Discharged on 05/29/2020  Component Date Value Ref Range Status  . Sodium 05/28/2020 135  135 - 145 mmol/L Final  . Potassium 05/28/2020 3.7  3.5 - 5.1 mmol/L Final  . Chloride 05/28/2020 102  98 - 111 mmol/L Final  . CO2 05/28/2020 22  22 - 32 mmol/L Final  . Glucose, Bld 05/28/2020 87  70 - 99 mg/dL Final   Glucose reference range applies only to samples taken after fasting for at least 8 hours.  . BUN 05/28/2020 13  6 - 20 mg/dL Final  . Creatinine, Ser 05/28/2020 0.77  0.61 - 1.24 mg/dL Final  . Calcium 16/05/9603 9.5  8.9 - 10.3 mg/dL Final  . Total Protein 05/28/2020 7.9  6.5 - 8.1 g/dL Final  . Albumin 54/04/8118 4.7  3.5 - 5.0 g/dL Final  . AST 14/78/2956 23  15 - 41 U/L Final  . ALT 05/28/2020 22  0 - 44 U/L Final  . Alkaline Phosphatase 05/28/2020 77  38 - 126 U/L Final  . Total Bilirubin 05/28/2020 0.8  0.3 - 1.2 mg/dL Final  . GFR, Estimated 05/28/2020 >60  >60 mL/min Final  . Anion gap 05/28/2020 11  5 - 15 Final   Performed at [facility], [address], [place], [place] [address]  . Alcohol, Ethyl (B) 05/28/2020 <10  <10 mg/dL Final   Comment: (NOTE) Lowest  detectable limit for serum alcohol is 10 mg/dL.  For medical purposes only. Performed at [facility], [address], [place], [place] [address]   . Opiates 05/28/2020 NONE DETECTED  NONE DETECTED Final  . Cocaine 05/28/2020 NONE DETECTED  NONE DETECTED Final  . Benzodiazepines 05/28/2020 POSITIVE* NONE DETECTED Final  . Amphetamines 05/28/2020 NONE DETECTED  NONE DETECTED Final  . Tetrahydrocannabinol 05/28/2020 POSITIVE* NONE DETECTED Final  . Barbiturates 05/28/2020 NONE DETECTED  NONE DETECTED Final   Comment: (NOTE) DRUG SCREEN FOR MEDICAL PURPOSES ONLY.  IF CONFIRMATION IS NEEDED FOR ANY PURPOSE, NOTIFY LAB WITHIN 5 DAYS.  LOWEST DETECTABLE LIMITS FOR URINE DRUG SCREEN Drug Class                     Cutoff (ng/mL) Amphetamine and metabolites    1000 Barbiturate and metabolites    200 Benzodiazepine                 200 Tricyclics and metabolites     300 Opiates and metabolites        300 Cocaine and metabolites        300 THC                            50 Performed at [facility], [address] [place], [place] [address]   . WBC 05/28/2020 21.4* 4.0 - 10.5 K/uL Final  . RBC 05/28/2020 5.40  4.22 - 5.81 MIL/uL Final  . Hemoglobin 05/28/2020 16.8  13.0 - 17.0 g/dL Final  . HCT 52/84/1324 48.5  39.0 - 52.0 % Final  .  MCV 05/28/2020 89.8  80.0 - 100.0 fL Final  . MCH 05/28/2020 31.1  26.0 - 34.0 pg Final  . MCHC 05/28/2020 34.6  30.0 - 36.0 g/dL Final  . RDW 41/66/0630 12.4  11.5 - 15.5 % Final  . Platelets 05/28/2020 353  150 - 400 K/uL Final  . nRBC 05/28/2020 0.0  0.0 - 0.2 % Final  . Neutrophils Relative % 05/28/2020 82  % Final  . Neutro Abs 05/28/2020 17.4* 1.7 - 7.7 K/uL Final  . Lymphocytes Relative 05/28/2020 13  % Final  . Lymphs Abs 05/28/2020 2.8  0.7 - 4.0 K/uL Final  . Monocytes Relative 05/28/2020 4  % Final  . Monocytes Absolute 05/28/2020 0.9  0.1 - 1.0 K/uL Final  . Eosinophils Relative 05/28/2020 0  % Final  . Eosinophils Absolute 05/28/2020 0.1   0.0 - 0.5 K/uL Final  . Basophils Relative 05/28/2020 0  % Final  . Basophils Absolute 05/28/2020 0.1  0.0 - 0.1 K/uL Final  . Immature Granulocytes 05/28/2020 1  % Final  . Abs Immature Granulocytes 05/28/2020 0.12* 0.00 - 0.07 K/uL Final   Performed at [facility], [address], [place], [place] [address]  Admission on 05/27/2020, Discharged on 05/28/2020  Component Date Value Ref Range Status  . Sodium 05/27/2020 138  135 - 145 mmol/L Final  . Potassium 05/27/2020 3.9  3.5 - 5.1 mmol/L Final  . Chloride 05/27/2020 100  98 - 111 mmol/L Final  . CO2 05/27/2020 29  22 - 32 mmol/L Final  . Glucose, Bld 05/27/2020 91  70 - 99 mg/dL Final   Glucose reference range applies only to samples taken after fasting for at least 8 hours.  . BUN 05/27/2020 13  6 - 20 mg/dL Final  . Creatinine, Ser 05/27/2020 0.87  0.61 - 1.24 mg/dL Final  . Calcium 93/23/5573 9.3  8.9 - 10.3 mg/dL Final  . Total Protein 05/27/2020 8.1  6.5 - 8.1 g/dL Final  . Albumin 22/09/5425 4.8  3.5 - 5.0 g/dL Final  . AST 01/30/7627 24  15 - 41 U/L Final  . ALT 05/27/2020 22  0 - 44 U/L Final  . Alkaline Phosphatase 05/27/2020 79  38 - 126 U/L Final  . Total Bilirubin 05/27/2020 0.6  0.3 - 1.2 mg/dL Final  . GFR, Estimated 05/27/2020 >60  >60 mL/min Final  . Anion gap 05/27/2020 9  5 - 15 Final   Performed at [facility], [address], [place], [place] [address]  . Alcohol, Ethyl (B) 05/27/2020 <10  <10 mg/dL Final   Comment: (NOTE) Lowest detectable limit for serum alcohol is 10 mg/dL.  For medical purposes only. Performed at [facility], [address], [place], [place] [address]   . Salicylate Lvl 05/27/2020 <7.0* 7.0 - 30.0 mg/dL Final   Performed at [facility], [address] [place], [place] [address]  . Acetaminophen (Tylenol), Serum 05/27/2020 <10* 10 - 30 ug/mL Final   Comment: (NOTE) Therapeutic concentrations vary significantly. A range of 10-30 ug/mL  may be an  effective concentration for many patients. However, some  are best treated at concentrations outside of this range. Acetaminophen concentrations >150 ug/mL at 4 hours after ingestion  and >50 ug/mL at 12 hours after ingestion are often associated with  toxic reactions.  Performed at [facility], [address] [place], [place] [address]   . WBC 05/27/2020 10.7* 4.0 - 10.5 K/uL Final  . RBC 05/27/2020 5.41  4.22 -  5.81 MIL/uL Final  . Hemoglobin 05/27/2020 16.8  13.0 - 17.0 g/dL Final  . HCT 16/05/9603 47.6  39.0 - 52.0 % Final  . MCV 05/27/2020 88.0  80.0 - 100.0 fL Final  . MCH 05/27/2020 31.1  26.0 - 34.0 pg Final  . MCHC 05/27/2020 35.3  30.0 - 36.0 g/dL Final  . RDW 54/04/8118 12.5  11.5 - 15.5 % Final  . Platelets 05/27/2020 320  150 - 400 K/uL Final  . nRBC 05/27/2020 0.0  0.0 - 0.2 % Final   Performed at [facility], [address] [place], [place] [address]  . Tricyclic, Ur Screen 05/27/2020 POSITIVE* NONE DETECTED Final  . Amphetamines, Ur Screen 05/27/2020 NONE DETECTED  NONE DETECTED Final  . MDMA (Ecstasy)Ur Screen 05/27/2020 NONE DETECTED  NONE DETECTED Final  . Cocaine Metabolite,Ur [place] 05/27/2020 NONE DETECTED  NONE DETECTED Final  . Opiate, Ur Screen 05/27/2020 POSITIVE* NONE DETECTED Final  . Phencyclidine (PCP) Ur S 05/27/2020 NONE DETECTED  NONE DETECTED Final  . Cannabinoid 50 Ng, Ur [place] 05/27/2020 POSITIVE* NONE DETECTED Final  . Barbiturates, Ur Screen 05/27/2020 NONE DETECTED  NONE DETECTED Final  . Benzodiazepine, Ur Scrn 05/27/2020 POSITIVE* NONE DETECTED Final  . Methadone Scn, Ur 05/27/2020 POSITIVE* NONE DETECTED Final   Comment: (NOTE) Tricyclics + metabolites, urine    Cutoff 1000 ng/mL Amphetamines + metabolites, urine  Cutoff 1000 ng/mL MDMA (Ecstasy), urine              Cutoff 500 ng/mL Cocaine Metabolite, urine          Cutoff 300 ng/mL Opiate + metabolites, urine        Cutoff 300 ng/mL Phencyclidine (PCP), urine          Cutoff 25 ng/mL Cannabinoid, urine                 Cutoff 50 ng/mL Barbiturates + metabolites, urine  Cutoff 200 ng/mL Benzodiazepine, urine              Cutoff 200 ng/mL Methadone, urine                   Cutoff 300 ng/mL  The urine drug screen provides only a preliminary, unconfirmed analytical test result and should not be used for non-medical purposes. Clinical consideration and professional judgment should be applied to any positive drug screen result due to possible interfering substances. A more specific alternate chemical method must be used in order to obtain a confirmed analytical result. Gas chromatography / mass spectrometry (GC/MS) is the preferred confirm                          atory method. Performed at [facility], [address], [place], [place] [address]   . SARS Coronavirus 2 by RT PCR 05/27/2020 NEGATIVE  NEGATIVE Final   Comment: (NOTE) SARS-CoV-2 target nucleic acids are NOT DETECTED.  The SARS-CoV-2 RNA is generally detectable in upper respiratoy specimens during the acute phase of infection. The lowest concentration of SARS-CoV-2 viral copies this assay can detect is 131 copies/mL. A negative result does not preclude SARS-Cov-2 infection and should not be used as the sole basis for treatment or other patient management decisions. A negative result may occur with  improper specimen collection/handling, submission of specimen other than nasopharyngeal swab, presence of viral mutation(s) within the areas targeted by this assay, and inadequate number of viral copies (<131 copies/mL). A negative result must be  combined with clinical observations, patient history, and epidemiological information. The expected result is Negative.  Fact Sheet for Patients:  https://www.moore.com/  Fact Sheet for Healthcare Providers:  https://www.young.biz/  This test is no                          t yet approved or cleared  by the [place] FDA and  has been authorized for detection and/or diagnosis of SARS-CoV-2 by FDA under an Emergency Use Authorization (EUA). This EUA will remain  in effect (meaning this test can be used) for the duration of the COVID-19 declaration under Section 564(b)(1) of the Act, 21 U.S.C. section 360bbb-3(b)(1), unless the authorization is terminated or revoked sooner.    . Influenza A by PCR 05/27/2020 NEGATIVE  NEGATIVE Final  . Influenza B by PCR 05/27/2020 NEGATIVE  NEGATIVE Final   Comment: (NOTE) The Xpert Xpress SARS-CoV-2/FLU/RSV assay is intended as an aid in  the diagnosis of influenza from Nasopharyngeal swab specimens and  should not be used as a sole basis for treatment. Nasal washings and  aspirates are unacceptable for Xpert Xpress SARS-CoV-2/FLU/RSV  testing.  Fact Sheet for Patients: https://www.moore.com/  Fact Sheet for Healthcare Providers: https://www.young.biz/  This test is not yet approved or cleared by the [place] FDA and  has been authorized for detection and/or diagnosis of SARS-CoV-2 by  FDA under an Emergency Use Authorization (EUA). This EUA will remain  in effect (meaning this test can be used) for the duration of the  Covid-19 declaration under Section 564(b)(1) of the Act, 21  U.S.C. section 360bbb-3(b)(1), unless the authorization is  terminated or revoked. Performed at [facility], [address], [place], [place] [address]   Admission on 05/22/2020, Discharged on 05/23/2020  Component Date Value Ref Range Status  . WBC 05/22/2020 15.7* 4.0 - 10.5 K/uL Final  . RBC 05/22/2020 5.15  4.22 - 5.81 MIL/uL Final  . Hemoglobin 05/22/2020 15.9  13.0 - 17.0 g/dL Final  . HCT 60/45/4098 45.5  39.0 - 52.0 % Final  . MCV 05/22/2020 88.3  80.0 - 100.0 fL Final  . MCH 05/22/2020 30.9  26.0 - 34.0 pg Final  . MCHC 05/22/2020 34.9  30.0 - 36.0 g/dL Final  . RDW 11/91/4782 12.1  11.5 - 15.5  % Final  . Platelets 05/22/2020 286  150 - 400 K/uL Final  . nRBC 05/22/2020 0.0  0.0 - 0.2 % Final   Performed at [facility], [address] [place], [place] [address]  . Sodium 05/22/2020 135  135 - 145 mmol/L Final  . Potassium 05/22/2020 4.3  3.5 - 5.1 mmol/L Final  . Chloride 05/22/2020 98  98 - 111 mmol/L Final  . CO2 05/22/2020 23  22 - 32 mmol/L Final  . Glucose, Bld 05/22/2020 102* 70 - 99 mg/dL Final   Glucose reference range applies only to samples taken after fasting for at least 8 hours.  . BUN 05/22/2020 14  6 - 20 mg/dL Final  . Creatinine, Ser 05/22/2020 0.96  0.61 - 1.24 mg/dL Final  . Calcium 30/86/5784 9.5  8.9 - 10.3 mg/dL Final  . Total Protein 05/22/2020 8.1  6.5 - 8.1 g/dL Final  . Albumin 69/62/9528 4.8  3.5 - 5.0 g/dL Final  . AST 41/32/4401 23  15 - 41 U/L Final  . ALT 05/22/2020 24  0 - 44 U/L Final  . Alkaline Phosphatase 05/22/2020 82  38 - 126 U/L  Final  . Total Bilirubin 05/22/2020 0.7  0.3 - 1.2 mg/dL Final  . GFR, Estimated 05/22/2020 >60  >60 mL/min Final  . Anion gap 05/22/2020 14  5 - 15 Final   Performed at [facility], [address] [place], [place] [address]  . Troponin I (High Sensitivity) 05/22/2020 3  <18 ng/L Final   Comment: (NOTE) Elevated high sensitivity troponin I (hsTnI) values and significant  changes across serial measurements may suggest ACS but many other  chronic and acute conditions are known to elevate hsTnI results.  Refer to the "Links" section for chest pain algorithms and additional  guidance. Performed at [facility], [address], [place], [place] [address]   . Alcohol, Ethyl (B) 05/22/2020 <10  <10 mg/dL Final   Comment: (NOTE) Lowest detectable limit for serum alcohol is 10 mg/dL.  For medical purposes only. Performed at [facility], [address], [place], [place] [address]   . Tricyclic, Ur Screen 05/22/2020 POSITIVE* NONE DETECTED Final  . Amphetamines, Ur  Screen 05/22/2020 POSITIVE* NONE DETECTED Final  . MDMA (Ecstasy)Ur Screen 05/22/2020 NONE DETECTED  NONE DETECTED Final  . Cocaine Metabolite,Ur [place] 05/22/2020 NONE DETECTED  NONE DETECTED Final  . Opiate, Ur Screen 05/22/2020 NONE DETECTED  NONE DETECTED Final  . Phencyclidine (PCP) Ur S 05/22/2020 NONE DETECTED  NONE DETECTED Final  . Cannabinoid 50 Ng, Ur  05/22/2020 POSITIVE* NONE DETECTED Final  . Barbiturates, Ur Screen 05/22/2020 NONE DETECTED  NONE DETECTED Final  . Benzodiazepine, Ur Scrn 05/22/2020 POSITIVE* NONE DETECTED Final  . Methadone Scn, Ur 05/22/2020 NONE DETECTED  NONE DETECTED Final   Comment: (NOTE) Tricyclics + metabolites, urine    Cutoff 1000 ng/mL Amphetamines + metabolites, urine  Cutoff 1000 ng/mL MDMA (Ecstasy), urine              Cutoff 500 ng/mL Cocaine Metabolite, urine          Cutoff 300 ng/mL Opiate + metabolites, urine        Cutoff 300 ng/mL Phencyclidine (PCP), urine         Cutoff 25 ng/mL Cannabinoid, urine                 Cutoff 50 ng/mL Barbiturates + metabolites, urine  Cutoff 200 ng/mL Benzodiazepine, urine              Cutoff 200 ng/mL Methadone, urine                   Cutoff 300 ng/mL  The urine drug screen provides only a preliminary, unconfirmed analytical test result and should not be used for non-medical purposes. Clinical consideration and professional judgment should be applied to any positive drug screen result due to possible interfering substances. A more specific alternate chemical method must be used in order to obtain a confirmed analytical result. Gas chromatography / mass spectrometry (GC/MS) is the preferred confirm                          atory method. Performed at [facility], [address], [place], [place] [address]   . SARS Coronavirus 2 by RT PCR 05/22/2020 NEGATIVE  NEGATIVE Final   Comment: (NOTE) SARS-CoV-2 target nucleic acids are NOT DETECTED.  The SARS-CoV-2 RNA is generally detectable in  upper respiratoy specimens during the acute phase of infection. The lowest concentration of SARS-CoV-2 viral copies this assay can detect is 131 copies/mL. A negative result  does not preclude SARS-Cov-2 infection and should not be used as the sole basis for treatment or other patient management decisions. A negative result may occur with  improper specimen collection/handling, submission of specimen other than nasopharyngeal swab, presence of viral mutation(s) within the areas targeted by this assay, and inadequate number of viral copies (<131 copies/mL). A negative result must be combined with clinical observations, patient history, and epidemiological information. The expected result is Negative.  Fact Sheet for Patients:  https://www.moore.com/  Fact Sheet for Healthcare Providers:  https://www.young.biz/  This test is no                          t yet approved or cleared by the [place] FDA and  has been authorized for detection and/or diagnosis of SARS-CoV-2 by FDA under an Emergency Use Authorization (EUA). This EUA will remain  in effect (meaning this test can be used) for the duration of the COVID-19 declaration under Section 564(b)(1) of the Act, 21 U.S.C. section 360bbb-3(b)(1), unless the authorization is terminated or revoked sooner.    . Influenza A by PCR 05/22/2020 NEGATIVE  NEGATIVE Final  . Influenza B by PCR 05/22/2020 NEGATIVE  NEGATIVE Final   Comment: (NOTE) The Xpert Xpress SARS-CoV-2/FLU/RSV assay is intended as an aid in  the diagnosis of influenza from Nasopharyngeal swab specimens and  should not be used as a sole basis for treatment. Nasal washings and  aspirates are unacceptable for Xpert Xpress SARS-CoV-2/FLU/RSV  testing.  Fact Sheet for Patients: https://www.moore.com/  Fact Sheet for Healthcare Providers: https://www.young.biz/  This test is not yet approved or  cleared by the [place] FDA and  has been authorized for detection and/or diagnosis of SARS-CoV-2 by  FDA under an Emergency Use Authorization (EUA). This EUA will remain  in effect (meaning this test can be used) for the duration of the  Covid-19 declaration under Section 564(b)(1) of the Act, 21  U.S.C. section 360bbb-3(b)(1), unless the authorization is  terminated or revoked. Performed at [facility], [address] [place], [place] [address]   . Troponin I (High Sensitivity) 05/22/2020 3  <18 ng/L Final   Comment: (NOTE) Elevated high sensitivity troponin I (hsTnI) values and significant  changes across serial measurements may suggest ACS but many other  chronic and acute conditions are known to elevate hsTnI results.  Refer to the "Links" section for chest pain algorithms and additional  guidance. Performed at [facility], [address] [place], [place] [address]     Allergies: Poison ivy extract [poison ivy extract]  PTA Medications: (Not in a hospital admission)   Medical Decision Making  Admitted to Continuous Assessment Labs Reviewed  Lab Orders     Resp Panel by RT-PCR (Flu A&B, Covid) Nasopharyngeal Swab     CBC with Differential/Platelet     POCT Urine Drug Screen - (ICup)     POC SARS Coronavirus 2 Ag-ED - Nasal Swab    Medication management Patient wanted to start something for depression will start Prozac 20 mg daily Meds ordered this encounter  Medications  . acetaminophen (TYLENOL) tablet 650 mg  . alum & mag hydroxide-simeth (MAALOX/MYLANTA) 200-200-20 MG/5ML suspension 30 mL  . magnesium hydroxide (MILK OF MAGNESIA) suspension 30 mL  . hydrOXYzine (ATARAX/VISTARIL) tablet 25 mg  . traZODone (DESYREL) tablet 50 mg  . FLUoxetine (PROZAC) capsule 20 mg   Plans to discharge in AM  Recommendations  Based on my evaluation the  patient does not appear to have an emergency medical condition.  Octavion Mollenkopf, NP 08/21/20   6:21 PM

## 2020-08-21 NOTE — ED Notes (Signed)
Pt sleeping@this time. Breathing even and unlabored. No c/o of pain or distress. Will continue to monitor for safety 

## 2020-08-21 NOTE — Discharge Instructions (Signed)
Please follow up for Intensive Outpatient Program (IOP) (Substance Use) and Partial Hospitalization Program (PHP)  [facility] 2nd Floor [address] [place], [place] [address] [phone]  Referral has been placed and should receive a call from Milana Na or Donia Guiles to schedule an appointment.   Additional resources: Residential Treatment  Facilities Medicaid Detox No Technical sales engineer ([facility]) [address] [place], [place] [phone] or  [phone]   No  Yes  Yes  Yes    [facility] [phone] [place], [place] [address] [phone] Admissions: 8am-3pm  M-F   [place] only  No  Yes  No    [facility] [phone]   No  Yes  No- out of pocket 16,000  Yes   [facility]) [address] [place], [place] [phone]   Yes- No medicare  Yes   Yes, [place]   [address] only    [address] [place], [place], [address] [phone]            No  No  Yes but private pay, offers some sponsorships  Does not take insurance   Residential Treatment  Facilities Medicaid Detox No Insurance Private Insurance   [facility] [place], [place] [phone] (takes everyone as long as they meet detox criteria)   Yes  Yes   Yes  Yes   [address] [place], [place]  [phone] 27 locations    No  No- sober living house  90.00-130.00 per week per person  [facility] [place] Outreach [phone] [email or web address] .Lorayne Marek [facility] Part of [place] Outreach [phone] [email or web address]    No   [facility] [address]  [place] [phone] [email or web address]   No  No  1,200.00 a 200.00 deposit is required at start of  treatment Payment plans accepted [facility] based program  No   [facility] [place] [address]  [place], [place], [address] [phone]        No  Yes       Yes  Regular Rehab: 7,500.00 28 days Dual Diagnosis: 8,900.00 28 days 7 day detox: 2.700.00 or 3,400.00 for Dual Diagnosis     Yes   Residential Treatment  Facilities *out of state   Medicaid  Detox  No Insurance  Private Insurance   [facility] *out of state facilities [phone]   No  Yes  Private Pay  Yes    [facility] *out of state facilities  [phone]   No  Yes  Private Pay   Yes                  Outpatient Treatment  Facilities Medicaid Detox No Insurance Private  Insurance   [facility] [address]  [place], [place], [address] [phone]   No  No  No  Yes    [facility] and Partial Hospitalization Program  (If substance abuse is secondary diagnosis) [address]  [place], [place] [address] [phone]   Yes-[place] Only for Partial   No   No   Yes- IOP   [facility]patient [address]  [place], [place], [address] [phone]   Yes  They would go to ER at [facility] then be transferred to a detox unit  Yes- self pay     Yes    [facility]  [address]  [place], [place], [address] And  [address],  [place], [place] [address] [phone]           Yes  No    Yes most qualify for state funding  IOP and  Opiod treatment- Offers Methadone    UHC, Nehemiah Settle, [place]  Outpatient Treatment  Facilities Medicaid Detox No [employer]  [facility] [address] [place], [place], [address] [phone] (suboxone)  YES No Private Pay Medicaid only   [facility] [address] [place], [place],  [phone]   Yes but not for suboxone treatment  Yes- opiates  with suboxone have to commit to 8 week IOP   Yes 595.00 for first visit  150.00 for prescription 150.00 a week after that for group    Yes    [facility] [address]  [place], [place] [phone]  No- has a waiting list about to be approved No Yes- but has to be self pay  500.00 for 1st 2 weeks  500 for next 2 weeks and  750 mo. Ongoing Yes    [facility] [phone]  [address] [place], [place] [phone]  No No Limited sponsorships IOP- 9,500.00 8-15 weeks If paid upfront gives a 500.00 deduction Outpatient- 1 day a week  9 weeks 4,500.00 has payment plans   Yes- out of network though   [facility] (Groups/Residential) [place], [place]  [phone]  Yes- [place] No IOP facility  Yes  No  Outpatient Treatment  Facilities Medicaid Detox No [facility]   [place] [facility]  [address]  [place], [place] [address] [phone]   Yes   No    Yes     Mobile Crisis: [facility]: [phone]  (For crisis response 24 hours a day) [facility] Hotline: [phone]

## 2020-08-22 LAB — CBC WITH DIFFERENTIAL/PLATELET
Abs Immature Granulocytes: 0.04 10*3/uL (ref 0.00–0.07)
Basophils Absolute: 0.1 10*3/uL (ref 0.0–0.1)
Basophils Relative: 0 %
Eosinophils Absolute: 0.1 10*3/uL (ref 0.0–0.5)
Eosinophils Relative: 1 %
HCT: 44.9 % (ref 39.0–52.0)
Hemoglobin: 15.3 g/dL (ref 13.0–17.0)
Immature Granulocytes: 0 %
Lymphocytes Relative: 22 %
Lymphs Abs: 2.9 10*3/uL (ref 0.7–4.0)
MCH: 30.2 pg (ref 26.0–34.0)
MCHC: 34.1 g/dL (ref 30.0–36.0)
MCV: 88.7 fL (ref 80.0–100.0)
Monocytes Absolute: 0.7 10*3/uL (ref 0.1–1.0)
Monocytes Relative: 5 %
Neutro Abs: 9.1 10*3/uL — ABNORMAL HIGH (ref 1.7–7.7)
Neutrophils Relative %: 72 %
Platelets: 257 10*3/uL (ref 150–400)
RBC: 5.06 MIL/uL (ref 4.22–5.81)
RDW: 12.9 % (ref 11.5–15.5)
WBC: 12.9 10*3/uL — ABNORMAL HIGH (ref 4.0–10.5)
nRBC: 0 % (ref 0.0–0.2)

## 2020-08-22 LAB — RESP PANEL BY RT-PCR (FLU A&B, COVID) ARPGX2
Influenza A by PCR: NEGATIVE
Influenza B by PCR: NEGATIVE
SARS Coronavirus 2 by RT PCR: NEGATIVE

## 2020-08-22 MED ORDER — FLUOXETINE HCL 20 MG PO CAPS: 20.0000 mg | ORAL_CAPSULE | Freq: Every day | ORAL | 0 refills | Status: DC

## 2020-08-22 MED ORDER — FLUOXETINE HCL 20 MG PO CAPS: 20.0000 mg | ORAL_CAPSULE | Freq: Every day | ORAL | 0 refills | Status: AC

## 2020-08-22 NOTE — ED Notes (Addendum)
Pt sleeping@this time. Breathing even and unlabored. Will continue to monitor for safety 

## 2020-08-22 NOTE — ED Notes (Signed)
Discharge instructions provided and Pt stated understanding. Safety maintained and will continue to monitor.

## 2020-08-22 NOTE — ED Provider Notes (Signed)
FBC/OBS ASAP Discharge Summary  Date and Time: 08/22/2020 3:23 PM  Name: Ryan Kelley  MRN:  [id number]   Discharge Diagnoses:  Final diagnoses:  Opioid dependence with uncomplicated intoxication (HCC)  Benzodiazepine abuse (HCC)  Suicidal ideation  Substance induced mood disorder (HCC)  Polysubstance abuse (HCC)    Subjective: Patient interviewed this AM, he is calm, cooperative and pleasant. He admits that he came to the [facility] because "I pretty much had no where to go". He denies SI/HI/AVH and states that he got off the phone with [facility] [place] as well as his family who will pick him up and bring him there. He requests discharge  Stay Summary:    Ryan Kelley, 35 y.o., male patient presents to [facility]  As a walk in with complaints of suicidal ideation and command auditory hallucinations on 1/13.  Patient was discharged from [facility] 08/21/19 and sent to [facility] where he left AMA.  Patient lied stating that he told staff at [facility] that he had to see his cousin who was in a accident and may die; and could present back in 24 hours but after speaking with [facility] patient told them he did not need their services and left.  Patient admitted to Continuous Assessment unit for stabilization.  Patient given information for rehab facilities so he can call and check bed availability.  Patient referred to the [facility] and has been entered on his AVS.  Pt was admitted for observation. The following day he denied SI/HI/AVH and stated that he has already spoke to his family about a place to go once discharged and requests to leave. See above for more details  Total Time spent with patient: 20 minutes  Past Psychiatric History: polysubstance abuse Past Medical History:  Past Medical History:  Diagnosis Date  . Anxiety   . Attempted suicide (HCC)   . Chronic back pain   . Chronic knee pain   . Depression   . Insomnia   . PTSD (post-traumatic stress disorder)   .  Substance abuse (HCC)    No past surgical history on file. Family History:  Family History  Problem Relation Age of Onset  . Diabetes Mother   . Cancer Mother   . Hypertension Mother   . Hypertension Father    Family Psychiatric History: see H&P Social History:  Social History   Substance and Sexual Activity  Alcohol Use Yes   Comment: occasionally      Social History   Substance and Sexual Activity  Drug Use Yes  . Frequency: 5.0 times per week  . Types: Hydrocodone, Oxycodone, Benzodiazepines, Marijuana   Comment: marijuana last used 07/06/16, heroin last used 07/07/16    Social History   Socioeconomic History  . Marital status: Single    Spouse name: Not on file  . Number of children: Not on file  . Years of education: Not on file  . Highest education level: Not on file  Occupational History  . Not on file  Tobacco Use  . Smoking status: Current Every Day Smoker    Packs/day: 1.00    Years: 7.00    Pack years: 7.00    Types: Cigarettes  . Smokeless tobacco: Never Used  Substance and Sexual Activity  . Alcohol use: Yes    Comment: occasionally   . Drug use: Yes    Frequency: 5.0 times per week    Types: Hydrocodone, Oxycodone, Benzodiazepines, Marijuana    Comment: marijuana  last used 07/06/16, heroin last used 07/07/16  . Sexual activity: Not on file  Other Topics Concern  . Not on file  Social History Narrative   ** Merged History Encounter **       Social Determinants of Health   Financial Resource Strain: Not on file  Food Insecurity: Not on file  Transportation Needs: Not on file  Physical Activity: Not on file  Stress: Not on file  Social Connections: Not on file   SDOH:  SDOH Screenings   Alcohol Screen: Not on file  Depression (PHQ2-9): Medium Risk  . PHQ-2 Score: 27  Financial Resource Strain: Not on file  Food Insecurity: Not on file  Housing: Not on file  Physical Activity: Not on file  Social Connections: Not on file  Stress:  Not on file  Tobacco Use: High Risk  . Smoking Tobacco Use: Current Every Day Smoker  . Smokeless Tobacco Use: Never Used  Transportation Needs: Not on file    Has this patient used any form of tobacco in the last 30 days? (Cigarettes, Smokeless Tobacco, Cigars, and/or Pipes) Prescription not provided because: just provided rx a couple days ago  Current Medications:  No current facility-administered medications for this encounter.   Current Outpatient Medications  Medication Sig Dispense Refill  . FLUoxetine (PROZAC) 20 MG capsule Take 1 capsule (20 mg total) by mouth daily. 30 capsule 0    PTA Medications: (Not in a hospital admission)   Musculoskeletal  Strength & Muscle Tone: within normal limits Gait & Station: normal Patient leans: N/A  Psychiatric Specialty Exam  Presentation  General Appearance: Appropriate for Environment; Casual  Eye Contact:Good  Speech:Normal Rate  Speech Volume:Normal  Handedness:Right   Mood and Affect  Mood:Euthymic  Affect:Congruent; Appropriate   Thought Process  Thought Processes:Coherent; Goal Directed; Linear  Descriptions of Associations:Intact  Orientation:Full (Time, Place and Person)  Thought Content:WDL  Hallucinations:Hallucinations: None Description of Command Hallucinations: Voices telling him to kill himself and his aunt Description of Auditory Hallucinations: Voices telling him to kill himself and his aunt  Ideas of Reference:None  Suicidal Thoughts:Suicidal Thoughts: No SI Active Intent and/or Plan: With Plan; Without Intent (States he will overdose, cut himself, or hang himself)  Homicidal Thoughts:Homicidal Thoughts: No HI Passive Intent and/or Plan: Without Intent; Without Plan   Sensorium  Memory:Immediate Good; Recent Good; Remote Fair  Judgment:Fair  Insight:Fair   Executive Functions  Concentration:Good  Attention Span:Good  Recall:Good  Fund of  Knowledge:Good  Language:Good   Psychomotor Activity  Psychomotor Activity:Psychomotor Activity: Normal   Assets  Assets:Communication Skills; Desire for Improvement   Sleep  Sleep:Sleep: Fair   Physical Exam  Physical Exam Constitutional:      Appearance: Normal appearance. He is normal weight.  HENT:     Head: Normocephalic and atraumatic.  Eyes:     Extraocular Movements: Extraocular movements intact.  Pulmonary:     Effort: Pulmonary effort is normal.  Neurological:     Mental Status: He is alert.    Review of Systems  Constitutional: Negative for chills and fever.  Respiratory: Negative for cough.   Cardiovascular: Negative for chest pain.  Gastrointestinal: Negative for abdominal pain.  Neurological: Negative for headaches.  Psychiatric/Behavioral: Positive for substance abuse. Negative for depression, hallucinations and suicidal ideas.   Blood pressure 122/82, pulse 64, temperature 97.8 F (36.6 C), temperature source Temporal, resp. rate 16, SpO2 100 %. There is no height or weight on file to calculate BMI.   Demographic Factors:  Male, Caucasian and Unemployed  Loss Factors: NA  Historical Factors: Family history of mental illness or substance abuse  Risk Reduction Factors:   Living with another person, especially a relative and Positive social support  Continued Clinical Symptoms:  Alcohol/Substance Abuse/Dependencies  Cognitive Features That Contribute To Risk:  None    Suicide Risk:  Minimal: No identifiable suicidal ideation.  Patients presenting with no risk factors but with morbid ruminations; may be classified as minimal risk based on the severity of the depressive symptoms  Plan Of Care/Follow-up recommendations:  Activity:  as tolerated Diet:  regular Other:      In the event of worsening symptoms, patient is instructed to call the crisis hotline, 911 and or go to the nearest ED for appropriate evaluation and treatment  of symptoms. To follow-up with his/her primary care provider for your other medical issues, concerns and or health care needs.     Disposition: discharge to care of family member  Estella Husk, MD 08/22/2020, 3:23 PM

## 2020-08-22 NOTE — ED Notes (Signed)
Pt resting with eyes closed. Safety maintained and will continue to monitor.

## 2020-08-23 ENCOUNTER — Other Ambulatory Visit: Payer: Self-pay

## 2020-08-23 ENCOUNTER — Encounter: Payer: Self-pay | Admitting: Intensive Care

## 2020-08-23 ENCOUNTER — Emergency Department
Admission: EM | Admit: 2020-08-23 | Discharge: 2020-08-25 | Disposition: A | Discharge status: Home / Self Care | Payer: Self-pay | Attending: Emergency Medicine | Admitting: Emergency Medicine

## 2020-08-23 DIAGNOSIS — F17210 Nicotine dependence, cigarettes, uncomplicated: Secondary | ICD-10-CM | POA: Insufficient documentation

## 2020-08-23 DIAGNOSIS — F1120 Opioid dependence, uncomplicated: Secondary | ICD-10-CM | POA: Diagnosis present

## 2020-08-23 DIAGNOSIS — Z765 Malingerer [conscious simulation]: Secondary | ICD-10-CM

## 2020-08-23 DIAGNOSIS — F1924 Other psychoactive substance dependence with psychoactive substance-induced mood disorder: Secondary | ICD-10-CM | POA: Insufficient documentation

## 2020-08-23 DIAGNOSIS — R440 Auditory hallucinations: Secondary | ICD-10-CM

## 2020-08-23 DIAGNOSIS — F1310 Sedative, hypnotic or anxiolytic abuse, uncomplicated: Secondary | ICD-10-CM | POA: Diagnosis present

## 2020-08-23 DIAGNOSIS — F4310 Post-traumatic stress disorder, unspecified: Secondary | ICD-10-CM | POA: Insufficient documentation

## 2020-08-23 DIAGNOSIS — F1320 Sedative, hypnotic or anxiolytic dependence, uncomplicated: Secondary | ICD-10-CM | POA: Diagnosis present

## 2020-08-23 DIAGNOSIS — F1994 Other psychoactive substance use, unspecified with psychoactive substance-induced mood disorder: Secondary | ICD-10-CM | POA: Diagnosis present

## 2020-08-23 DIAGNOSIS — Z20822 Contact with and (suspected) exposure to covid-19: Secondary | ICD-10-CM | POA: Insufficient documentation

## 2020-08-23 DIAGNOSIS — F329 Major depressive disorder, single episode, unspecified: Secondary | ICD-10-CM | POA: Insufficient documentation

## 2020-08-23 DIAGNOSIS — R45851 Suicidal ideations: Secondary | ICD-10-CM | POA: Insufficient documentation

## 2020-08-23 HISTORY — DX: Carpal tunnel syndrome, unspecified upper limb: G56.00

## 2020-08-23 LAB — URINE DRUG SCREEN, QUALITATIVE (ARMC ONLY)
Amphetamines, Ur Screen: NOT DETECTED
Barbiturates, Ur Screen: NOT DETECTED
Benzodiazepine, Ur Scrn: POSITIVE — AB
Cannabinoid 50 Ng, Ur ~~LOC~~: POSITIVE — AB
Cocaine Metabolite,Ur ~~LOC~~: NOT DETECTED
MDMA (Ecstasy)Ur Screen: NOT DETECTED
Methadone Scn, Ur: NOT DETECTED
Opiate, Ur Screen: NOT DETECTED
Phencyclidine (PCP) Ur S: NOT DETECTED
Tricyclic, Ur Screen: NOT DETECTED

## 2020-08-23 LAB — COMPREHENSIVE METABOLIC PANEL
ALT: 28 U/L (ref 0–44)
AST: 27 U/L (ref 15–41)
Albumin: 4.9 g/dL (ref 3.5–5.0)
Alkaline Phosphatase: 76 U/L (ref 38–126)
Anion gap: 13 (ref 5–15)
BUN: 14 mg/dL (ref 6–20)
CO2: 27 mmol/L (ref 22–32)
Calcium: 9.5 mg/dL (ref 8.9–10.3)
Chloride: 98 mmol/L (ref 98–111)
Creatinine, Ser: 0.97 mg/dL (ref 0.61–1.24)
GFR, Estimated: >60 mL/min (ref >60)
Glucose, Bld: 126 mg/dL — ABNORMAL HIGH (ref 70–99)
Potassium: 4 mmol/L (ref 3.5–5.1)
Sodium: 138 mmol/L (ref 135–145)
Total Bilirubin: 0.6 mg/dL (ref 0.3–1.2)
Total Protein: 7.7 g/dL (ref 6.5–8.1)

## 2020-08-23 LAB — CBC
HCT: 44.4 % (ref 39.0–52.0)
Hemoglobin: 15.7 g/dL (ref 13.0–17.0)
MCH: 31 pg (ref 26.0–34.0)
MCHC: 35.4 g/dL (ref 30.0–36.0)
MCV: 87.7 fL (ref 80.0–100.0)
Platelets: 250 10*3/uL (ref 150–400)
RBC: 5.06 MIL/uL (ref 4.22–5.81)
RDW: 12.7 % (ref 11.5–15.5)
WBC: 10.4 10*3/uL (ref 4.0–10.5)
nRBC: 0 % (ref 0.0–0.2)

## 2020-08-23 LAB — SALICYLATE LEVEL: Salicylate Lvl: <7 mg/dL — ABNORMAL LOW (ref 7.0–30.0)

## 2020-08-23 LAB — ACETAMINOPHEN LEVEL: Acetaminophen (Tylenol), Serum: <10 ug/mL — ABNORMAL LOW (ref 10–30)

## 2020-08-23 LAB — ETHANOL: Alcohol, Ethyl (B): <10 mg/dL (ref <10)

## 2020-08-23 MED ORDER — LORAZEPAM 2 MG PO TABS
2.0000 mg | ORAL_TABLET | Freq: Once | ORAL | Status: AC
  Administered 2020-08-23: 2 mg via ORAL
  Filled 2020-08-23: qty 1

## 2020-08-23 NOTE — ED Provider Notes (Addendum)
[facility] Emergency Department Provider Note   ____________________________________________   Event Date/Time   First MD Initiated Contact with Patient 08/23/20 1553     (approximate)  I have reviewed the triage vital signs and the nursing notes.   HISTORY  Chief Complaint Delusional (Hearing voices), Depression, and Suicidal    HPI Ryan Kelley is a 35 y.o. male patient feels depressed and suicidal.  He also says he is hearing voices.  The voices told him to leave last time he came to the hospital to ask for help.  He listen to those voices and left.  He has been using heroin and benzos.  He snorted heroin this morning.         Past Medical History:  Diagnosis Date  . Anxiety   . Attempted suicide (HCC)   . Carpal tunnel syndrome   . Chronic back pain   . Chronic knee pain   . Depression   . Insomnia   . PTSD (post-traumatic stress disorder)   . Substance abuse [facility])     Patient Active Problem List   Diagnosis Date Noted  . Polysubstance abuse (HCC) 08/21/2020  . Benzodiazepine abuse (HCC) 05/27/2020  . 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 Opioid withdrawal (HCC) 07/08/2016  . Substance induced mood disorder (HCC) 12/05/2013  . Opioid dependence (HCC) 10/18/2012  . Opioid use with withdrawal (HCC) 10/18/2012    History reviewed. No pertinent surgical history.  Prior to Admission medications   Medication Sig Start Date End Date Taking? Authorizing Provider  FLUoxetine (PROZAC) 20 MG capsule Take 1 capsule (20 mg total) by mouth daily. 08/22/20 09/21/20 Yes Estella Husk, MD  SUMAtriptan (IMITREX) 50 MG tablet May repeat in 2 hours if headache persists or recurs.  Do not exceed more than 4 tablets in 24 hours Patient not taking: Reported on 11/25/2015 03/13/15 12/26/15  Burgess Amor, PA-C    Allergies Poison ivy extract [poison ivy extract]  Family History  Problem Relation Age of Onset  . Diabetes Mother   . Cancer Mother    . Hypertension Mother   . Hypertension Father     Social History Social History   Tobacco Use  . Smoking status: Current Every Day Smoker    Packs/day: 1.00    Years: 7.00    Pack years: 7.00    Types: Cigarettes  . Smokeless tobacco: Never Used  Substance Use Topics  . Alcohol use: Yes    Comment: occasionally   . Drug use: Yes    Frequency: 5.0 times per week    Types: Hydrocodone, Oxycodone, Benzodiazepines, Marijuana    Comment: heroin last used 08/23/20    Review of Systems  Constitutional: No fever/chills Eyes: No visual changes. ENT: No sore throat. Cardiovascular: Denies chest pain. Respiratory: Denies shortness of breath. Gastrointestinal: No abdominal pain.  No nausea, no vomiting.  No diarrhea.  No constipation. Genitourinary: Negative for dysuria. Musculoskeletal: Negative for back pain. Skin: Negative for rash. Neurological: Negative for headaches, focal weakness   ____________________________________________   PHYSICAL EXAM:  VITAL SIGNS: ED Triage Vitals [08/23/20 1511]  Enc Vitals Group     BP      Pulse      Resp      Temp      Temp src      SpO2      Weight 220 lb (99.8 kg)     Height 5\' 5"  (1.651 m)     Head Circumference  Peak Flow      Pain Score 0     Pain Loc      Pain Edu?      Excl. in GC?     Constitutional: Alert and oriented. Well appearing and in no acute distress.  He is several times in the room appearing to talk to himself. Eyes: Conjunctivae are normal.  Head: Atraumatic. Nose: No congestion/rhinnorhea. Mouth/Throat: Mucous membranes are moist.  Oropharynx non-erythematous. Neck: No stridor.  Cardiovascular: Normal rate, regular rhythm. Grossly normal heart sounds.  Good peripheral circulation. Respiratory: Normal respiratory effort.  No retractions. Lungs CTAB. Gastrointestinal: Soft and nontender. No distention. No abdominal bruits. No CVA tenderness. Musculoskeletal: No lower extremity tenderness nor edema.    Neurologic:  Normal speech and language. No gross focal neurologic deficits are appreciated. No gait instability. Skin:  Skin is warm, dry and intact. No rash noted.   ____________________________________________   LABS (all labs ordered are listed, but only abnormal results are displayed)  Labs Reviewed  COMPREHENSIVE METABOLIC PANEL - Abnormal; Notable for the following components:      Result Value   Glucose, Bld 126 (*)    All other components within normal limits  SALICYLATE LEVEL - Abnormal; Notable for the following components:   Salicylate Lvl <7.0 (*)    All other components within normal limits  ACETAMINOPHEN LEVEL - Abnormal; Notable for the following components:   Acetaminophen (Tylenol), Serum <10 (*)    All other components within normal limits  URINE DRUG SCREEN, QUALITATIVE ([facility] ONLY) - Abnormal; Notable for the following components:   Cannabinoid 50 Ng, Ur [place] POSITIVE (*)    Benzodiazepine, Ur Scrn POSITIVE (*)    All other components within normal limits  RESP PANEL BY RT-PCR (FLU A&B, COVID) ARPGX2  ETHANOL  CBC   ____________________________________________  EKG   ____________________________________________  RADIOLOGY Jill Poling, personally viewed and evaluated these images (plain radiographs) as part of my medical decision making, as well as reviewing the written report by the radiologist.    Official radiology report(s): No results found.  ____________________________________________   PROCEDURES  Procedure(s) performed (including Critical Care):  Procedures   ____________________________________________   INITIAL IMPRESSION / ASSESSMENT AND PLAN / ED COURSE The patient has been placed in psychiatric observation due to the need to provide a safe environment for the patient while obtaining psychiatric consultation and evaluation, as well as ongoing medical and medication management to treat the patient's condition.  The  patient has been placed under full IVC at this time.              ____________________________________________   FINAL CLINICAL IMPRESSION(S) / ED DIAGNOSES  Final diagnoses:  Suicidal thoughts  Auditory hallucinations     ED Discharge Orders    None      *Please note:  DALTIN CRIST was evaluated in Emergency Department on 08/23/2020 for the symptoms described in the history of present illness. He was evaluated in the context of the global COVID-19 pandemic, which necessitated consideration that the patient might be at risk for infection with the SARS-CoV-2 virus that causes COVID-19. Institutional protocols and algorithms that pertain to the evaluation of patients at risk for COVID-19 are in a state of rapid change based on information released by regulatory bodies including the CDC and federal and state organizations. These policies and algorithms were followed during the patient's care in the ED.  Some ED evaluations and interventions may be delayed as a result of  limited staffing during and the pandemic.*   Note:  This document was prepared using Dragon voice recognition software and may include unintentional dictation errors.    Arnaldo Natal, MD 08/23/20 2159    Arnaldo Natal, MD 08/23/20 [phone]

## 2020-08-23 NOTE — ED Notes (Signed)
Pt. Alert and oriented, warm and dry, in no distress.  Patient states he is still having same feeling as earlier. Patient contracts for safety with this Clinical research associate. Pt. Encouraged to let nursing staff know of any concerns or needs.  ENVIRONMENTAL ASSESSMENT Potentially harmful objects out of patient reach: Yes.   Personal belongings secured: Yes.   Patient dressed in hospital provided attire only: Yes.   Plastic bags out of patient reach: Yes.   Patient care equipment (cords, cables, call bells, lines, and drains) shortened, removed, or accounted for: Yes.   Equipment and supplies removed from bottom of stretcher: Yes.   Potentially toxic materials out of patient reach: Yes.   Sharps container removed or out of patient reach: Yes.

## 2020-08-23 NOTE — ED Notes (Signed)
Patient voided in specimen cup and UDS sent to lab.

## 2020-08-23 NOTE — ED Notes (Addendum)
Patient belonging: black shirt, white undershirt, blue jeans, blue underwear, black ring, brown coat, black watch, grey shoes, orange bandana, white socks, camo hat, sandals, black lighter, silver dog tag, $1.50 in change, two packs of cigarettes   Black wallet with 1 debit card and [employer] GC, ID, [id number]

## 2020-08-23 NOTE — ED Triage Notes (Addendum)
Patient c/o SI and HI voices that became very strong a couple of days ago. Denies taking psych meds. Or seeing psychiatrist. Has been seen in past for psychiatric evaluation. Patient reports using heroin and benzos. Last use of herion this AM (snorting). Patient requesting to keep his bible with him. Patient is very anxious and talks to himself often.

## 2020-08-23 NOTE — ED Notes (Signed)
Nurse went to assess Patient, Patient is running in the room, talking to himself, He tells nurse that He is having Si/hi thoughts and that it started a few days ago, Patient is redirectable, NUrse gave Patient water at His request and He said " Thank You" Patient will be monitored for safety per staff. Patient cannot give any explanation of triggers for this change in behavior except that He states " I feel unsafe" .

## 2020-08-23 NOTE — BH Assessment (Signed)
Comprehensive Clinical Assessment (CCA) Note  08/23/2020 [name] [id number]  Chief Complaint:  Chief Complaint  Patient presents with  . Delusional    Hearing voices  . Depression  . Suicidal   [name] arrived to the ED by way of personal transportation by his mother. [name] reports, "I'm having suicidal thoughts and homicidal thoughts and I am hearing voices". [name] reports having a plan to harm himself.  He stated, "I was gonna try a concoction of pills and if that didn't work, I was gonna slit my wrists. I didn't have a plan past that". [name] reports that he has been feeling like this for about a week.  He reports that he has not been on any medication.  He reports that he has been using heroine and benzodiazepines. [name] reports increased depression with fluctuation of appetites, difficulty sleeping for the last month,  isolating himself from others, and not completing his ADLs as usual.  He further reports having a poor memory.  [name] appeared to be responding to internal stimuli at the onset and during his assessment.    TTS spoke with Ryan Kelley (Mother) - [phone].  She reports, "He has an addiction problem.  He has been on heroin for 2 years and he also uses benzos.  He got married 2 months ago, and his wife left him due to his drug use and she don't know if she is [place] come back.  He has no job, no money, he is basically homeless, he talks about taking his own life. He has been confrontational.  His tendencies are suicidal or homicidal. In the past he was on meds for depression and anxiety.  He has severe social anxiety.  He has no point to live and its making him more and more have thoughts of harming himself.  He talks to himself.  He will be in the room just having a conversation.  He feels he has been pushed into a corner and suicide is his only option.  Visit Diagnosis: Major Depressive Disorder, Substance Abuse   CCA Screening, Triage and Referral  (STR)  Patient Reported Information How did you hear about [place]? Family/Friend  Referral name: [name]  Referral phone number: [phone]   Whom do you see for routine medical problems? I don't have a doctor  Practice/Facility Name: No data recorded Practice/Facility Phone Number: No data recorded Name of Contact: No data recorded Contact Number: No data recorded Contact Fax Number: No data recorded Prescriber Name: No data recorded Prescriber Address (if known): No data recorded  What Is the Reason for Your Visit/Call Today? Mental Health Crisis. Detox As Well (Phreesia 08/21/2020)  How Long Has This Been Causing You Problems? 1 wk - 1 month  What Do You Feel Would Help You the Most Today? Other (Comment) (Phreesia 08/21/2020)   Have You Recently Been in Any Inpatient Treatment (Hospital/Detox/Crisis Center/28-Day Program)? Yes  Name/Location of Program/Hospital:[facility]  How Long Were You There? 1 week  When Were You Discharged? No data recorded  Have You Ever Received Services From [facility] Before? Yes  Who Do You See at [facility]? This Urgent Care Facility (Phreesia 08/21/2020)   Have You Recently Had Any Thoughts About Hurting Yourself? Yes  Are You Planning to Commit Suicide/Harm Yourself At This time? Yes   Have you Recently Had Thoughts About Hurting Someone Ryan Kelley? Yes  Explanation: I Thought About Harming My Aunt For Lies She Recently Told Me (  Phreesia 08/21/2020)   Have You Used Any Alcohol or Drugs in the Past 24 Hours? Yes  How Long Ago Did You Use Drugs or Alcohol? 1755  What Did You Use and How Much? Heroine, Benzodiazepines   Do You Currently Have a Therapist/Psychiatrist? No  Name of Therapist/Psychiatrist: No data recorded  Have You Been Recently Discharged From Any Office Practice or Programs? Yes (Phreesia 08/21/2020)  Explanation of Discharge From Practice/Program: My Aunt Lied About My Cousins Health To Persuade Me  To  [facility] (Phreesia 08/21/2020)     CCA Screening Triage Referral Assessment Type of Contact: Face-to-Face  Is this Initial or Reassessment? Initial Assessment  Date Telepsych consult ordered in [facility]:  05/28/2020  Time Telepsych consult ordered in [facility]:  1951   Patient Reported Information Reviewed? Yes  Patient Left Without Being Seen? No data recorded Reason for Not Completing Assessment: No data recorded  Collateral Involvement: none reported   Does Patient Have a Court Appointed Legal Guardian? No data recorded Name and Contact of Legal Guardian: Self  If Minor and Not Living with Parent(s), Who has Custody? n/a  Is CPS involved or ever been involved? Never  Is APS involved or ever been involved? Never   Patient Determined To Be At Risk for Harm To Self or Others Based on Review of Patient Reported Information or Presenting Complaint? Yes, for Self-Harm  Method: No data recorded Availability of Means: No data recorded Intent: No data recorded Notification Required: No data recorded Additional Information for Danger to Others Potential: No data recorded Additional Comments for Danger to Others Potential: No data recorded Are There Guns or Other Weapons in Your Home? No data recorded Types of Guns/Weapons: No data recorded Are These Weapons Safely Secured?                            No data recorded Who Could Verify You Are Able To Have These Secured: No data recorded Do You Have any Outstanding Charges, Pending Court Dates, Parole/Probation? No data recorded Contacted To Inform of Risk of Harm To Self or Others: No data recorded  Location of Assessment: [facility] ED   Does Patient Present under Involuntary Commitment? No  IVC Papers Initial File Date: No data recorded  [place] of Residence: [place]   Patient Currently Receiving the Following Services: Not Receiving Services   Determination of  Need: Emergent (2 hours)   Options For Referral: Inpatient Hospitalization     CCA Biopsychosocial Intake/Chief Complaint:  Hearing voices, suicidal thoughts, homicidal thoughts  Current Symptoms/Problems: Hearing voices, suicidal thoughts, homicidal thoughts   Patient Reported Schizophrenia/Schizoaffective Diagnosis in Past: No data recorded  Strengths: UTA  Preferences: UTA  Abilities: UTA   Type of Services Patient Feels are Needed: Patient is requesting a referral to a detox facility   Initial Clinical Notes/Concerns: No data recorded  Mental Health Symptoms Depression:  Change in energy/activity; Difficulty Concentrating; Hopelessness; Increase/decrease in appetite; Irritability; Sleep (too much or little); Tearfulness; Worthlessness   Duration of Depressive symptoms: Greater than two weeks   Mania:  N/A   Anxiety:   Difficulty concentrating; Irritability; Restlessness; Sleep; Worrying; Tension   Psychosis:  Hallucinations   Duration of Psychotic symptoms: Less than six months   Trauma:  N/A   Obsessions:  N/A   Compulsions:  N/A   Inattention:  N/A   Hyperactivity/Impulsivity:  No data recorded  Oppositional/Defiant Behaviors:  N/A   Emotional Irregularity:  Chronic feelings of emptiness   Other Mood/Personality Symptoms:  No data recorded   Mental Status Exam Appearance and self-care  Stature:  Average   Weight:  Average weight   Clothing:  Disheveled   Grooming:  Normal   Cosmetic use:  None   Posture/gait:  Tense   Motor activity:  Agitated   Sensorium  Attention:  Normal   Concentration:  Normal   Orientation:  Place; Person; Situation; Time; Object   Recall/memory:  Normal   Affect and Mood  Affect:  Anxious   Mood:  Anxious   Relating  Eye contact:  Normal   Facial expression:  Tense   Attitude toward examiner:  Cooperative   Thought and Language  Speech flow: Soft; Slurred   Thought content:  Appropriate to  Mood and Circumstances   Preoccupation:  Suicide   Hallucinations:  Auditory; Command (Comment); Visual   Organization:  No data recorded  Affiliated Computer ServicesExecutive Functions  Fund of Knowledge:  Fair   Intelligence:  Average   Abstraction:  Normal   Judgement:  Fair   Dance movement psychotherapisteality Testing:  Adequate   Insight:  Fair   Decision Making:  Normal   Social Functioning  Social Maturity:  Irresponsible   Social Judgement:  "Street Smart"   Stress  Stressors:  Surveyor, quantityinancial; Relationship; Family conflict   Coping Ability:  Deficient supports   Skill Deficits:  Activities of daily living; Interpersonal; Self-control   Supports:  Friends/Service system     Religion:    Leisure/Recreation: Leisure / Recreation Do You Have Hobbies?: No  Exercise/Diet: Exercise/Diet Do You Exercise?: No Do You Follow a Special Diet?: No Do You Have Any Trouble Sleeping?: Yes   CCA Employment/Education Employment/Work Situation: Employment / Work Situation Employment situation: Unemployed  Education: Education Is Patient Currently Attending School?: No Did Garment/textile technologistYou Graduate From [employer] School?: Yes Did Theme park managerYou Attend College?: No   CCA Family/Childhood History Family and Relationship History: Family history Marital status: Married Number of Years Married: 1 Are you sexually active?: Yes Does patient have children?: No  Childhood History:  Childhood History By whom was/is the patient raised?: Mother Does patient have siblings?: Yes (Sibling passed away) Did patient suffer any verbal/emotional/physical/sexual abuse as a child?: No Did patient suffer from severe childhood neglect?: No Has patient ever been sexually abused/assaulted/raped as an adolescent or adult?: No Was the patient ever a victim of a crime or a disaster?: No Witnessed domestic violence?: No Has patient been affected by domestic violence as an adult?: No  Child/Adolescent Assessment:     CCA Substance Use Alcohol/Drug  Use: Alcohol / Drug Use History of alcohol / drug use?: Yes Negative Consequences of Use: Financial,Personal relationships Substance #1 Name of Substance 1: Heroine 1 - Age of First Use: 32 1 - Amount (size/oz): 1 gram 1 - Frequency: daily 1 - Last Use / Amount: 08/23/2020 Substance #2 Name of Substance 2: Benzodiazepines 2 - Age of First Use: 10 2 - Amount (size/oz): 3.5-7 mg 2 - Frequency: 2-3 weekly 2 - Last Use / Amount: 08/21/2020                     ASAM's:  Six Dimensions of Multidimensional Assessment  Dimension 1:  Acute Intoxication and/or Withdrawal Potential:      Dimension 2:  Biomedical Conditions and Complications:      Dimension 3:  Emotional, Behavioral, or Cognitive Conditions and Complications:     Dimension 4:  Readiness to Change:     Dimension 5:  Relapse, Continued use, or Continued Problem Potential:     Dimension 6:  Recovery/Living Environment:     ASAM Severity Score:    ASAM Recommended Level of Treatment:     Substance use Disorder (SUD) Substance Use Disorder (SUD)  Checklist Symptoms of Substance Use: Continued use despite persistent or recurrent social, interpersonal problems, caused or exacerbated by use,Evidence of tolerance,Large amounts of time spent to obtain, use or recover from the substance(s),Presence of craving or strong urge to use,Recurrent use that results in a failure to fulfill major role obligations (work, school, home),Substance(s) often taken in larger amounts or over longer times than was intended  Recommendations for Services/Supports/Treatments:    DSM5 Diagnoses: Patient Active Problem List   Diagnosis Date Noted  . Polysubstance abuse (HCC) 08/21/2020  . Benzodiazepine abuse (HCC) 05/27/2020  . 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 Opioid withdrawal (HCC) 07/08/2016  . Substance induced mood disorder (HCC) 12/05/2013  . Opioid dependence (HCC) 10/18/2012  . Opioid use with withdrawal (HCC) 10/18/2012     Justice Deeds, Counselor

## 2020-08-24 LAB — RESP PANEL BY RT-PCR (FLU A&B, COVID) ARPGX2
Influenza A by PCR: NEGATIVE
Influenza B by PCR: NEGATIVE
SARS Coronavirus 2 by RT PCR: NEGATIVE

## 2020-08-24 LAB — TSH: TSH: 3.009 u[IU]/mL (ref 0.350–4.500)

## 2020-08-24 MED ORDER — CITALOPRAM HYDROBROMIDE 20 MG PO TABS
20.0000 mg | ORAL_TABLET | Freq: Every day | ORAL | Status: DC
  Administered 2020-08-24 – 2020-08-25 (×2): 20 mg via ORAL
  Filled 2020-08-24 (×2): qty 1

## 2020-08-24 MED ORDER — HYDROXYZINE HCL 25 MG PO TABS
50.0000 mg | ORAL_TABLET | Freq: Once | ORAL | Status: AC
  Administered 2020-08-24: 50 mg via ORAL
  Filled 2020-08-24: qty 2

## 2020-08-24 NOTE — ED Notes (Signed)
SOC in progress.  

## 2020-08-24 NOTE — ED Notes (Signed)
Report given to [facility].  Patient moved to interview room for privacy during [facility] assessment.

## 2020-08-24 NOTE — ED Notes (Signed)
Pts tray placed on bed. Pt sleeping at this time

## 2020-08-24 NOTE — ED Notes (Signed)
Pt given blanket.

## 2020-08-24 NOTE — ED Notes (Signed)
Patient is IVC pending inpatient admit 

## 2020-08-24 NOTE — BH Assessment (Signed)
Per SOC, Pt meets criteria for INPT and is currently placement

## 2020-08-24 NOTE — BH Assessment (Addendum)
Referral Check:   .    [facility] [phone]) Per [place] [facility]), no appropriate bed available     [facility] ([phone]- [phone]), Kenard Gower reports denied due to insurance (unverifiable)   [facility] ([phone]--[phone]) Received after hours voicemail; unable to leave a message    [facility] [phone]---[phone]) No answer   [facility] [phone], [phone], [phone] or [phone]) Jennette Kettle request re-fax to [phone]   [facility] [phone]), No answer    [facility] [phone] -or- [phone]), Clydie Braun reports full capacity today and due to weather possibly tomorrow    [facility] [phone]) Left voicemail

## 2020-08-24 NOTE — ED Provider Notes (Addendum)
Emergency Medicine Observation Re-evaluation Note  Ryan Kelley is a 35 y.o. male, seen on rounds today.  Pt initially presented to the ED for complaints of Delusional (Hearing voices), Depression, and Suicidal Currently, the patient is resting, voices no medical complaints.  Physical Exam  BP 128/70 (BP Location: Right Arm)   Pulse (!) 113   Temp 98.6 F (37 C) (Oral)   Resp 18   Ht 5\' 5"  (1.651 m)   Wt 99.8 kg   SpO2 98%   BMI 36.61 kg/m  Physical Exam General: Resting in no acute distress Cardiac: No cyanosis Lungs: Equal rise and fall Psych: Not agitated  ED Course / MDM  EKG:    I have reviewed the labs performed to date as well as medications administered while in observation.  Recent changes in the last 24 hours include no events overnight.  Plan  Current plan is for psychiatric disposition.  Patient was seen by [facility] psychiatrist [facility] who recommends continuing IVC and inpatient hospitalization.  Follow-up TSH and consider Celexa 20 mg daily for mood if prolonged ED stay is expected. Patient is under full IVC at this time.   Hermelinda Medicus, MD 08/24/20 08/26/20    [id number], MD 08/24/20 [phone]

## 2020-08-25 DIAGNOSIS — F1994 Other psychoactive substance use, unspecified with psychoactive substance-induced mood disorder: Secondary | ICD-10-CM

## 2020-08-25 DIAGNOSIS — Z765 Malingerer [conscious simulation]: Secondary | ICD-10-CM

## 2020-08-25 MED ORDER — ACETAMINOPHEN 325 MG PO TABS
650.0000 mg | ORAL_TABLET | Freq: Once | ORAL | Status: AC
  Administered 2020-08-25: 650 mg via ORAL
  Filled 2020-08-25: qty 2

## 2020-08-25 MED ORDER — HYDROXYZINE HCL 25 MG PO TABS
50.0000 mg | ORAL_TABLET | Freq: Once | ORAL | Status: AC
  Administered 2020-08-25: 50 mg via ORAL
  Filled 2020-08-25: qty 2

## 2020-08-25 NOTE — ED Notes (Signed)
Report to wendy, rn

## 2020-08-25 NOTE — ED Notes (Signed)
Nurse talked to patient's mom ([place]) and she said that someone would pick him and take him home, Patient is calm and no behavioral issues, voices understanding of discharge instructions, all belongings given back to Patient.

## 2020-08-25 NOTE — ED Provider Notes (Signed)
Patient has been seen and evaluated by psychiatry.  They believe the patient safe for discharge home from a psychiatric standpoint.  Patient's medical work-up has been largely nonrevealing.  Psychiatry has rescinded the patient's IVC.   Minna Antis, MD 08/25/20 [phone]

## 2020-08-25 NOTE — ED Notes (Signed)
Patient complaining of generalized aches and pains, and diarrhea, anxiety due to drug withdrawal, Nurse administered tylenol and atarax po, attempted to notified MD regarding symptoms, MD is rooms with other Patients at this time, will reach out to him again, Patient is well mannered, cooperative, will continue to monitor. Camera surveillance in progress for safety.

## 2020-08-25 NOTE — ED Notes (Signed)
VOL, papers rescinded, pending D/C

## 2020-08-25 NOTE — Discharge Instructions (Signed)
You have been seen in the emergency department for a  psychiatric concern. You have been evaluated both medically as well as psychiatrically. Please follow-up with your outpatient resources provided. Return to the emergency department for any worsening symptoms, or any thoughts of hurting yourself or anyone else so that we may attempt to help you. 

## 2020-08-25 NOTE — ED Provider Notes (Signed)
Emergency Medicine Observation Re-evaluation Note  Ryan Kelley is a 35 y.o. male, seen on rounds today.  Pt initially presented to the ED for complaints of Delusional (Hearing voices), Depression, and Suicidal Currently, the patient is calm cooperative, no acute distress.  Only medical concern is he is having difficulty sleeping.  We will order a sleep aid for the patient..  Physical Exam  BP 130/70 (BP Location: Right Arm)   Pulse 60   Temp 97.9 F (36.6 C) (Oral)   Resp 20   Ht 5\' 5"  (1.651 m)   Wt 99.8 kg   SpO2 98%   BMI 36.61 kg/m  Physical Exam General: Awake alert, no distress. Cardiac: Regular rate and rhythm around 80 bpm.  No murmur Lungs: Clear to auscultation bilaterally Psych: Calm, normal affect  ED Course / MDM   Patient's COVID test yesterday was negative.  No other new labs.  Plan  Current plan is for inpatient psychiatric treatment.. Patient is under full IVC at this time.   , MD 08/25/20 1422

## 2020-08-25 NOTE — Consult Note (Signed)
[facility]   Reason for Consult: Consult for 35 year old man with substance abuse Referring Physician: Paduchowski Patient Identification: Rayann HemanSean J Bolotin MRN:  [id number] Principal Diagnosis: Substance induced mood disorder (HCC) Diagnosis:  Principal Problem:   Substance induced mood disorder (HCC) Active Problems:   Opioid dependence (HCC)   Benzodiazepine abuse (HCC)   Malingering   Total Time spent with patient: 1 hour  Subjective:   Rayann HemanSean J Holohan is a 35 y.o. male patient admitted with "I am feeling great".  HPI: Patient seen chart reviewed.  35 year old man with a long history of substance abuse.  He had been seen recently at the behavioral health services associated with [facility] in [place].  They had set him up to go [facility] but he had left there on the first day.  [name] and they discharged him from there with documentation that he was not suicidal or homicidal or psychotic.  He then appeared at our emergency room a few hours later giving a completely different history and story saying that he was suicidal homicidal and hearing voices.  On interview today the patient says his symptoms are entirely resolved.  Totally denies any suicidal or homicidal thought.  He wants to blame it all today on having been prescribed Chantix.  I have no idea whether he was ever actually given a prescription for this.  In any case he says he is feeling much better.  He says he has a place to live and that he feels like he will be fine with outpatient treatment.  Affect is upbeat mood is good no sign of psychosis or agitation.  Past Psychiatric History: Patient has a long history of substance abuse problems also a history of suicidal threats and instances in which he appears to be willing to change his story and say what is necessary to get the outcome he wants at a given moment.  Risk to Self:   Risk to Others:   Prior Inpatient Therapy:   Prior Outpatient  Therapy:    Past Medical History:  Past Medical History:  Diagnosis Date  . Anxiety   . Attempted suicide (HCC)   . Carpal tunnel syndrome   . Chronic back pain   . Chronic knee pain   . Depression   . Insomnia   . PTSD (post-traumatic stress disorder)   . Substance abuse (HCC)    History reviewed. No pertinent surgical history. Family History:  Family History  Problem Relation Age of Onset  . Diabetes Mother   . Cancer Mother   . Hypertension Mother   . Hypertension Father    Family Psychiatric  History: None known Social History:  Social History   Substance and Sexual Activity  Alcohol Use Yes   Comment: occasionally      Social History   Substance and Sexual Activity  Drug Use Yes  . Frequency: 5.0 times per week  . Types: Hydrocodone, Oxycodone, Benzodiazepines, Marijuana   Comment: heroin last used 08/23/20    Social History   Socioeconomic History  . Marital status: Single    Spouse name: Not on file  . Number of children: Not on file  . Years of education: Not on file  . Highest education level: Not on file  Occupational History  . Not on file  Tobacco Use  . Smoking status: Current Every Day Smoker    Packs/day: 1.00    Years: 7.00    Pack years: 7.00  Types: Cigarettes  . Smokeless tobacco: Never Used  Substance and Sexual Activity  . Alcohol use: Yes    Comment: occasionally   . Drug use: Yes    Frequency: 5.0 times per week    Types: Hydrocodone, Oxycodone, Benzodiazepines, Marijuana    Comment: heroin last used 08/23/20  . Sexual activity: Not on file  Other Topics Concern  . Not on file  Social History Narrative   ** Merged History Encounter **       Social Determinants of Health   Financial Resource Strain: Not on file  Food Insecurity: Not on file  Transportation Needs: Not on file  Physical Activity: Not on file  Stress: Not on file  Social Connections: Not on file   Additional Social History:    Allergies:   Allergies   Allergen Reactions  . Poison Ivy Extract [Poison Ivy Extract]     Labs:  Results for orders placed or performed during the hospital encounter of 08/23/20 (from the past 48 hour(s))  Comprehensive metabolic panel     Status: Abnormal   Collection Time: 08/23/20  3:18 PM  Result Value Ref Range   Sodium 138 135 - 145 mmol/L   Potassium 4.0 3.5 - 5.1 mmol/L   Chloride 98 98 - 111 mmol/L   CO2 27 22 - 32 mmol/L   Glucose, Bld 126 (H) 70 - 99 mg/dL    Comment: Glucose reference range applies only to samples taken after fasting for at least 8 hours.   BUN 14 6 - 20 mg/dL   Creatinine, Ser 0.17 0.61 - 1.24 mg/dL   Calcium 9.5 8.9 - 51.0 mg/dL   Total Protein 7.7 6.5 - 8.1 g/dL   Albumin 4.9 3.5 - 5.0 g/dL   AST 27 15 - 41 U/L   ALT 28 0 - 44 U/L   Alkaline Phosphatase 76 38 - 126 U/L   Total Bilirubin 0.6 0.3 - 1.2 mg/dL   GFR, Estimated >25 >85 mL/min    Comment: (NOTE) Calculated using the CKD-EPI Creatinine Equation (2021)    Anion gap 13 5 - 15    Comment: Performed at [facility], [address] [place], [place] [address]  Ethanol     Status: None   Collection Time: 08/23/20  3:18 PM  Result Value Ref Range   Alcohol, Ethyl (B) <10 <10 mg/dL    Comment: (NOTE) Lowest detectable limit for serum alcohol is 10 mg/dL.  For medical purposes only. Performed at [facility], [address] [place], [place] [address]   Salicylate level     Status: Abnormal   Collection Time: 08/23/20  3:18 PM  Result Value Ref Range   Salicylate Lvl <7.0 (L) 7.0 - 30.0 mg/dL    Comment: Performed at [facility], [address] [place], [place] [address]  Acetaminophen level     Status: Abnormal   Collection Time: 08/23/20  3:18 PM  Result Value Ref Range   Acetaminophen (Tylenol), Serum <10 (L) 10 - 30 ug/mL    Comment: (NOTE) Therapeutic concentrations vary significantly. A range of 10-30 ug/mL  may be an effective concentration for many patients.  However, some  are best treated at concentrations outside of this range. Acetaminophen concentrations >150 ug/mL at 4 hours after ingestion  and >50 ug/mL at 12 hours after ingestion are often associated with  toxic reactions.  Performed at [facility], [address] [place], [place] [address]   cbc  Status: None   Collection Time: 08/23/20  3:18 PM  Result Value Ref Range   WBC 10.4 4.0 - 10.5 K/uL   RBC 5.06 4.22 - 5.81 MIL/uL   Hemoglobin 15.7 13.0 - 17.0 g/dL   HCT 35.7 01.7 - 79.3 %   MCV 87.7 80.0 - 100.0 fL   MCH 31.0 26.0 - 34.0 pg   MCHC 35.4 30.0 - 36.0 g/dL   RDW 90.3 00.9 - 23.3 %   Platelets 250 150 - 400 K/uL   nRBC 0.0 0.0 - 0.2 %    Comment: Performed at [facility], [address], [place], [place] [address]  Urine Drug Screen, Qualitative     Status: Abnormal   Collection Time: 08/23/20  3:18 PM  Result Value Ref Range   Tricyclic, Ur Screen NONE DETECTED NONE DETECTED   Amphetamines, Ur Screen NONE DETECTED NONE DETECTED   MDMA (Ecstasy)Ur Screen NONE DETECTED NONE DETECTED   Cocaine Metabolite,Ur [place] NONE DETECTED NONE DETECTED   Opiate, Ur Screen NONE DETECTED NONE DETECTED   Phencyclidine (PCP) Ur S NONE DETECTED NONE DETECTED   Cannabinoid 50 Ng, Ur [place] POSITIVE (A) NONE DETECTED   Barbiturates, Ur Screen NONE DETECTED NONE DETECTED   Benzodiazepine, Ur Scrn POSITIVE (A) NONE DETECTED   Methadone Scn, Ur NONE DETECTED NONE DETECTED    Comment: (NOTE) Tricyclics + metabolites, urine    Cutoff 1000 ng/mL Amphetamines + metabolites, urine  Cutoff 1000 ng/mL MDMA (Ecstasy), urine              Cutoff 500 ng/mL Cocaine Metabolite, urine          Cutoff 300 ng/mL Opiate + metabolites, urine        Cutoff 300 ng/mL Phencyclidine (PCP), urine         Cutoff 25 ng/mL Cannabinoid, urine                 Cutoff 50 ng/mL Barbiturates + metabolites, urine  Cutoff 200 ng/mL Benzodiazepine, urine              Cutoff 200 ng/mL Methadone,  urine                   Cutoff 300 ng/mL  The urine drug screen provides only a preliminary, unconfirmed analytical test result and should not be used for non-medical purposes. Clinical consideration and professional judgment should be applied to any positive drug screen result due to possible interfering substances. A more specific alternate chemical method must be used in order to obtain a confirmed analytical result. Gas chromatography / mass spectrometry (GC/MS) is the preferred confirm atory method. Performed at [facility], [address] [place], [place] [address]   TSH     Status: None   Collection Time: 08/23/20  3:18 PM  Result Value Ref Range   TSH 3.009 0.350 - 4.500 uIU/mL    Comment: Performed by a 3rd Generation assay with a functional sensitivity of <=0.01 uIU/mL. Performed at [facility], [address] [place], [place] [address]   Resp Panel by RT-PCR (Flu A&B, Covid) Nasopharyngeal Swab     Status: None   Collection Time: 08/24/20  7:08 AM   Specimen: Nasopharyngeal Swab; Nasopharyngeal(NP) swabs in vial transport medium  Result Value Ref Range   SARS Coronavirus 2 by RT PCR NEGATIVE NEGATIVE    Comment: (NOTE) SARS-CoV-2 target nucleic acids are NOT DETECTED.  The SARS-CoV-2 RNA is generally detectable in upper respiratory specimens during the  acute phase of infection. The lowest concentration of SARS-CoV-2 viral copies this assay can detect is 138 copies/mL. A negative result does not preclude SARS-Cov-2 infection and should not be used as the sole basis for treatment or other patient management decisions. A negative result may occur with  improper specimen collection/handling, submission of specimen other than nasopharyngeal swab, presence of viral mutation(s) within the areas targeted by this assay, and inadequate number of viral copies(<138 copies/mL). A negative result must be combined with clinical observations, patient history,  and epidemiological information. The expected result is Negative.  Fact Sheet for Patients:  BloggerCourse.com  Fact Sheet for Healthcare Providers:  SeriousBroker.it  This test is no t yet approved or cleared by the [place] FDA and  has been authorized for detection and/or diagnosis of SARS-CoV-2 by FDA under an Emergency Use Authorization (EUA). This EUA will remain  in effect (meaning this test can be used) for the duration of the COVID-19 declaration under Section 564(b)(1) of the Act, 21 U.S.C.section 360bbb-3(b)(1), unless the authorization is terminated  or revoked sooner.       Influenza A by PCR NEGATIVE NEGATIVE   Influenza B by PCR NEGATIVE NEGATIVE    Comment: (NOTE) The Xpert Xpress SARS-CoV-2/FLU/RSV plus assay is intended as an aid in the diagnosis of influenza from Nasopharyngeal swab specimens and should not be used as a sole basis for treatment. Nasal washings and aspirates are unacceptable for Xpert Xpress SARS-CoV-2/FLU/RSV testing.  Fact Sheet for Patients: BloggerCourse.com  Fact Sheet for Healthcare Providers: SeriousBroker.it  This test is not yet approved or cleared by the [place] FDA and has been authorized for detection and/or diagnosis of SARS-CoV-2 by FDA under an Emergency Use Authorization (EUA). This EUA will remain in effect (meaning this test can be used) for the duration of the COVID-19 declaration under Section 564(b)(1) of the Act, 21 U.S.C. section 360bbb-3(b)(1), unless the authorization is terminated or revoked.  Performed at [facility], [address], [place], [place] [address]     Current Facility-Administered Medications  Medication Dose Route Frequency Provider Last Rate Last Admin  . citalopram (CELEXA) tablet 20 mg  20 mg Oral Daily Irean Hong, MD   20 mg at 08/25/20 8588   Current Outpatient  Medications  Medication Sig Dispense Refill  . FLUoxetine (PROZAC) 20 MG capsule Take 1 capsule (20 mg total) by mouth daily. 30 capsule 0    Musculoskeletal: Strength & Muscle Tone: within normal limits Gait & Station: normal Patient leans: N/A  Psychiatric Specialty Exam: Physical Exam Vitals and nursing note reviewed.  Constitutional:      Appearance: He is well-developed and well-nourished.  HENT:     Head: Normocephalic and atraumatic.  Eyes:     Conjunctiva/sclera: Conjunctivae normal.     Pupils: Pupils are equal, round, and reactive to light.  Cardiovascular:     Heart sounds: Normal heart sounds.  Pulmonary:     Effort: Pulmonary effort is normal.  Abdominal:     Palpations: Abdomen is soft.  Musculoskeletal:        General: Normal range of motion.     Cervical back: Normal range of motion.  Skin:    General: Skin is warm and dry.  Neurological:     General: No focal deficit present.     Mental Status: He is alert.  Psychiatric:        Mood and Affect: Mood normal.        Thought Content: Thought  content normal.     Review of Systems  Constitutional: Negative.   HENT: Negative.   Eyes: Negative.   Respiratory: Negative.   Cardiovascular: Negative.   Gastrointestinal: Negative.   Musculoskeletal: Negative.   Skin: Negative.   Neurological: Negative.   Psychiatric/Behavioral: Negative.     Blood pressure 130/70, pulse 60, temperature 97.9 F (36.6 C), temperature source Oral, resp. rate 20, height 5\' 5"  (1.651 m), weight 99.8 kg, SpO2 98 %.Body mass index is 36.61 kg/m.  General Appearance: Casual  Eye Contact:  Fair  Speech:  Clear and Coherent  Volume:  Normal  Mood:  Euthymic  Affect:  Congruent  Thought Process:  Goal Directed  Orientation:  Full (Time, Place, and Person)  Thought Content:  Logical  Suicidal Thoughts:  No  Homicidal Thoughts:  No  Memory:  Immediate;   Fair Recent;   Fair Remote;   Fair  Judgement:  Fair  Insight:  Fair   Psychomotor Activity:  Normal  Concentration:  Concentration: Fair  Recall:  FiservFair  Fund of Knowledge:  Fair  Language:  Fair  Akathisia:  No  Handed:  Right  AIMS (if indicated):     Assets:  Physical Health Resilience  ADL's:  Intact  Cognition:  WNL  Sleep:        Treatment Plan Summary: Plan It is very difficult to prove malingering, particularly with psychiatric illness, but I can say that this patient's recent behavior fits the pattern extremely closely.  In any case at this point he is denying any suicidal or homicidal thought affect is upbeat patient is lucid thinking clearly and wants to be discharged.  He has been reminded of his drug abuse problem and strongly encouraged to continue following up with outpatient mental health treatment in [place] or [place] or wherever he plans to be staying.  Disposition: No evidence of imminent risk to self or others at present.   Patient does not meet criteria for psychiatric inpatient admission. Supportive therapy provided about ongoing stressors. Discussed crisis plan, support from social network, calling 911, coming to the Emergency Department, and calling Suicide Hotline.  Mordecai RasmussenJohn Clapacs, MD 08/25/2020 3:07 PM

## 2020-08-25 NOTE — ED Notes (Signed)
Patient is talking with Dr. Toni Amend, He remains calm and cooperative.

## 2020-09-13 ENCOUNTER — Other Ambulatory Visit: Payer: Self-pay

## 2020-09-13 ENCOUNTER — Emergency Department (HOSPITAL_COMMUNITY)
Admission: EM | Admit: 2020-09-13 | Discharge: 2020-09-15 | Disposition: A | Discharge status: Home / Self Care | Payer: Self-pay | Attending: Emergency Medicine | Admitting: Emergency Medicine

## 2020-09-13 ENCOUNTER — Ambulatory Visit (HOSPITAL_COMMUNITY)
Admission: EM | Admit: 2020-09-13 | Discharge: 2020-09-13 | Disposition: A | Discharge status: Other Acute Inpatient Hospital | Payer: No Payment, Other | Attending: Psychiatry | Admitting: Psychiatry

## 2020-09-13 ENCOUNTER — Encounter (HOSPITAL_COMMUNITY): Payer: Self-pay | Admitting: Emergency Medicine

## 2020-09-13 DIAGNOSIS — Z008 Encounter for other general examination: Secondary | ICD-10-CM

## 2020-09-13 DIAGNOSIS — Z79899 Other long term (current) drug therapy: Secondary | ICD-10-CM | POA: Insufficient documentation

## 2020-09-13 DIAGNOSIS — F17210 Nicotine dependence, cigarettes, uncomplicated: Secondary | ICD-10-CM | POA: Insufficient documentation

## 2020-09-13 DIAGNOSIS — R45851 Suicidal ideations: Secondary | ICD-10-CM | POA: Insufficient documentation

## 2020-09-13 DIAGNOSIS — Z20822 Contact with and (suspected) exposure to covid-19: Secondary | ICD-10-CM | POA: Insufficient documentation

## 2020-09-13 DIAGNOSIS — F1320 Sedative, hypnotic or anxiolytic dependence, uncomplicated: Secondary | ICD-10-CM | POA: Diagnosis present

## 2020-09-13 DIAGNOSIS — F1910 Other psychoactive substance abuse, uncomplicated: Secondary | ICD-10-CM | POA: Insufficient documentation

## 2020-09-13 DIAGNOSIS — Y92481 Parking lot as the place of occurrence of the external cause: Secondary | ICD-10-CM | POA: Insufficient documentation

## 2020-09-13 DIAGNOSIS — F1120 Opioid dependence, uncomplicated: Secondary | ICD-10-CM | POA: Diagnosis present

## 2020-09-13 DIAGNOSIS — T401X4D Poisoning by heroin, undetermined, subsequent encounter: Secondary | ICD-10-CM | POA: Insufficient documentation

## 2020-09-13 LAB — COMPREHENSIVE METABOLIC PANEL
ALT: 30 U/L (ref 0–44)
AST: 28 U/L (ref 15–41)
Albumin: 4.2 g/dL (ref 3.5–5.0)
Alkaline Phosphatase: 66 U/L (ref 38–126)
Anion gap: 12 (ref 5–15)
BUN: 11 mg/dL (ref 6–20)
CO2: 28 mmol/L (ref 22–32)
Calcium: 8.7 mg/dL — ABNORMAL LOW (ref 8.9–10.3)
Chloride: 100 mmol/L (ref 98–111)
Creatinine, Ser: 0.96 mg/dL (ref 0.61–1.24)
GFR, Estimated: >60 mL/min (ref >60)
Glucose, Bld: 122 mg/dL — ABNORMAL HIGH (ref 70–99)
Potassium: 4.5 mmol/L (ref 3.5–5.1)
Sodium: 140 mmol/L (ref 135–145)
Total Bilirubin: 0.5 mg/dL (ref 0.3–1.2)
Total Protein: 6.5 g/dL (ref 6.5–8.1)

## 2020-09-13 LAB — CBC
HCT: 42.7 % (ref 39.0–52.0)
Hemoglobin: 15 g/dL (ref 13.0–17.0)
MCH: 31.6 pg (ref 26.0–34.0)
MCHC: 35.1 g/dL (ref 30.0–36.0)
MCV: 89.9 fL (ref 80.0–100.0)
Platelets: 279 10*3/uL (ref 150–400)
RBC: 4.75 MIL/uL (ref 4.22–5.81)
RDW: 13 % (ref 11.5–15.5)
WBC: 10.7 10*3/uL — ABNORMAL HIGH (ref 4.0–10.5)
nRBC: 0 % (ref 0.0–0.2)

## 2020-09-13 LAB — ETHANOL: Alcohol, Ethyl (B): <10 mg/dL (ref <10)

## 2020-09-13 NOTE — Discharge Instructions (Signed)
Patient to be transferred to the [facility] emergency department for medical clearance due to heroin overdose earlier this evening.

## 2020-09-13 NOTE — ED Triage Notes (Signed)
Pt states his wife called 911 in our parking lot because he was passed out unresponsive with witnessed drooling after overdose of Heroin.  EMS and fire department reported to the scene and administered Narcan to pt.  Pt then refused transport to hospital via EMS. Thereafter pt walked into [facility] requesting help with Detox.  Pt reports SI, denies HI or AVH.  Pt A&O x 4, answering questions appropriately at present.

## 2020-09-13 NOTE — ED Notes (Signed)
Ambulance transport requested,  [place], Legal guardian contacted, instructed to return call.

## 2020-09-13 NOTE — ED Triage Notes (Addendum)
Pt presents to ED BIB [facility]. Pt here from [facility] for medical clearance. Pt reports that he went to [facility] for detox from heroin. Pt reportedly overdosed just before getting to [facility], was given narcan and refused transport to hospital. Pt denies SI/HI. Pt AAOx4

## 2020-09-13 NOTE — ED Provider Notes (Addendum)
Behavioral Health Urgent Care Medical Screening Exam  Patient Name: Ryan Kelley MRN: [id number] Date of Evaluation: 09/13/20 Chief Complaint:   Diagnosis:  Final diagnoses:  Heroin overdose, undetermined intent, subsequent encounter  Suicidal ideation    Disposition: Patient is a 35 year old male with a history of polysubstance abuse who presents to the behavioral urgent care shortly after overdosing on heroin earlier this evening.  Per nursing, patient was unconscious and drooling in a vehicle in the behavioral health urgent care parking lot and EMS arrived with the fire department to the scene.  Patient was given Narcan and once he regained consciousness, patient insisted that he be assessed by psych at the behavioral health urgent care and refused to go to the emergency department with EMS. Based on my assessment and patient's history of earlier overdose, believe that the patient is currently experiencing an urgent medical condition and believe that the patient needs to be transferred to the emergency department for further evaluation/medical clearance. Will transfer the patient to the [facility] emergency department for further evaluation/medical clearance. Provider report given to Dr. Madilyn Hook via phone and Dr. Madilyn Hook has agreed to accept the patient.  Notified Dr. Madilyn Hook that the patient is also endorsing SI at this time and I recommend that the EDP assuming care of the patient in the emergency department place a TTS consult for the patient to be evaluated by psychiatry via telepsych in the emergency department to determine patient's future psych disposition when/if the patient is medically cleared.  Dr. Delana Meyer understanding and agreement of this plan. [facility] nursing report given to [facility] emergency department charge nurse. EMTALA form completed. [facility] Nursing attempted to notify patient's legal guardian of transfer to the ED from [facility] via phone, but were unsuccessful in doing so. [facility]  Nursing left voicemail for patient's legal guardian notifying them of patient's transfer to the ED.   History of Present illness: Ryan Kelley is a 35 y.o. male with a history of polysubstance abuse who presents to the behavioral health urgent care after overdosing on heroin earlier this evening.  Upon my arrival to the behavioral health urgent care, was alerted by nursing staff that the patient was seen unresponsive and drooling in a car in the [facility] parking lot.  Nursing notified me that EMS and the fire department arrived to the scene at the behavioral health urgent care parking lot, pulled the patient out of the car and gave the patient Narcan.  Patient then became responsive shortly after receiving the Narcan.  Nursing informed me that at that time the patient refused to go to the emergency department with EMS and insisted that he come inside to the behavioral health urgent care to have a psych assessment done.  Spoke with the patient briefly.  Patient admits that he overdosed on 0.25 g of heroin in the car around 6/6:30 PM on his way to the behavioral health urgent care as he was coming to the behavioral health urgent care [place] detox services.  Patient states that he was using heroin in the car earlier this evening and that then he became unconscious and woke up later while he was getting care from EMS.  Patient also states that he used Klonopin around 6:30 PM as well but amount was unspecified.  Patient states that he has been using heroin daily, about 1 g/day for the past 2 years and states that he has overdosed on heroin in the past.  Patient also endorses using about  5 0.5 mg Klonopin pills per day for the past year.  Patient endorses using marijuana on occasion, about 0.5 g/week.  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  Patient also endorses smoking 1 pack/day of cigarettes for the past 16  years.  He denies alcohol or additional substance use.   Patient endorses SI with no associated plan.  He denies HI, AVH, paranoia, or delusions.  He denies past suicide attempts or history of cutting or burning himself.  Patient states he is not taking any medications at this time and states that he has not seen a psychiatrist or therapist at this time.  Patient states he has had inpatient psychiatric treatment in the past, but is unable to provide further details about this.  Patient states that he lives in [place] with his friend and denies access to guns or weapons.  Patient states that he is married to his wife Ryan Kelley: [phone]), who was in the car with the patient earlier this evening and he states that he prefers that his mother be contacted if necessary ([name] Kelley: [phone]).   Psychiatric Specialty Exam  Presentation  General Appearance:Disheveled  Eye Contact:Fair  Speech:Normal Rate  Speech Volume:Normal  Handedness:Right   Mood and Affect  Mood:Anxious  Affect:Congruent   Thought Process  Thought Processes:Coherent; Goal Directed; Linear  Descriptions of Associations:Intact  Orientation:Full (Time, Place and Person)  Thought Content:WDL  Hallucinations:None Voices telling him to kill himself and his aunt Voices telling him to kill himself and his aunt demons  Ideas of Reference:None  Suicidal Thoughts:Yes, Active With Intent; Without Plan  Homicidal Thoughts:No Without Intent; Without Plan   Sensorium  Memory:Immediate Fair; Recent Fair; Remote Fair  Judgment:Poor  Insight:Lacking   Executive Functions  Concentration:Fair  Attention Span:Fair  Recall:Fair  Fund of Knowledge:Fair  Language:Fair   Psychomotor Activity  Psychomotor Activity:Normal   Assets  Assets:Communication Skills; Desire for Improvement; Housing; Leisure Time; Physical Health   Sleep  Sleep:Fair  Number of hours: No data  recorded  Physical Exam: Physical Exam Vitals reviewed.  Constitutional:      Appearance: He is not diaphoretic.     Comments: Patient is slightly ill-appearing.  HENT:     Head: Normocephalic and atraumatic.     Right Ear: External ear normal.     Left Ear: External ear normal.  Cardiovascular:     Rate and Rhythm: Tachycardia present.  Pulmonary:     Effort: Pulmonary effort is normal. No respiratory distress.  Musculoskeletal:        General: Normal range of motion.     Cervical back: Normal range of motion.  Neurological:     Mental Status: He is alert and oriented to person, place, and time.  Psychiatric:        Attention and Perception: He does not perceive auditory or visual hallucinations.        Mood and Affect: Mood is anxious.        Speech: Speech normal.        Behavior: Behavior is cooperative.        Thought Content: Thought content is not paranoid. Thought content includes suicidal ideation. Thought content does not include homicidal ideation. Thought content does not include suicidal plan.     Comments: Affect mood congruent.  Judgment poor and insight lacking.    Review of Systems  Constitutional: Negative for chills, diaphoresis,  fever, malaise/fatigue and weight loss.  HENT: Negative for congestion.   Respiratory: Negative for cough and shortness of breath.   Cardiovascular: Negative for chest pain and palpitations.  Gastrointestinal: Negative for abdominal pain, constipation, diarrhea, nausea and vomiting.  Musculoskeletal: Negative for joint pain and myalgias.  Neurological: Positive for seizures and loss of consciousness. Negative for dizziness and headaches.       Patient states that he was using heroin in the car earlier this evening and that then he became unconscious and woke up later while he was getting care from EMS. 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    Psychiatric/Behavioral: Positive for substance abuse and suicidal ideas. Negative for hallucinations. The patient is nervous/anxious.   All other systems reviewed and are negative.    Vitals: Blood pressure (!) 147/84, pulse (!) 120, temperature 97.8 F (36.6 C), temperature source Tympanic, resp. rate 18, SpO2 100 %. There is no height or weight on file to calculate BMI.  Musculoskeletal: Strength & Muscle Tone: within normal limits Gait & Station: normal Patient leans: N/A   [facility] MSE Discharge Disposition for Follow up and Recommendations: Based on my evaluation, the patient appears to have an urgent medical condition for which I recommended the patient be transferred to the emergency department for further evaluation/medical clearance. Patient is a 35 year old male with a history of polysubstance abuse who presents to the behavioral urgent care shortly after overdosing on heroin earlier this evening.  Per nursing, patient was unconscious and drooling in a vehicle in the behavioral health urgent care parking lot and EMS arrived with the fire department to the scene.  Patient was given Narcan and once he regained consciousness, patient insisted that he be assessed by psych at the behavioral health urgent care and refused to go to the emergency department with EMS. Based on my assessment and patient's history of earlier overdose, believe that the patient is currently experiencing an urgent medical condition and believe that the patient needs to be transferred to the emergency department for further evaluation/medical clearance. Will transfer the patient to the [facility] emergency department for further evaluation/medical clearance. Provider report given to Dr. Madilyn Hook via phone and Dr. Madilyn Hook has agreed to accept the patient.  Notified Dr. Madilyn Hook that the patient is also endorsing SI at this time and I recommend that the EDP assuming care of the patient in the emergency department place a TTS consult  for the patient to be evaluated by psychiatry via telepsych in the emergency department to determine patient's future psych disposition when/if the patient is medically cleared.  Dr. Delana Meyer understanding and agreement of this plan. [facility] nursing report given to [facility] emergency department charge nurse. EMTALA form completed. [facility] Nursing attempted to notify patient's legal guardian of transfer to the ED from [facility] via phone, but were unsuccessful in doing so. [facility] Nursing left voicemail for patient's legal guardian notifying them of patient's transfer to the ED.   Jaclyn Shaggy, PA-C 09/13/2020, 9:03 PM

## 2020-09-14 LAB — RAPID URINE DRUG SCREEN, HOSP PERFORMED
Amphetamines: NOT DETECTED
Barbiturates: NOT DETECTED
Benzodiazepines: POSITIVE — AB
Cocaine: NOT DETECTED
Opiates: NOT DETECTED
Tetrahydrocannabinol: POSITIVE — AB

## 2020-09-14 LAB — RESP PANEL BY RT-PCR (FLU A&B, COVID) ARPGX2
Influenza A by PCR: NEGATIVE
Influenza B by PCR: NEGATIVE
SARS Coronavirus 2 by RT PCR: NEGATIVE

## 2020-09-14 MED ORDER — LORAZEPAM 1 MG PO TABS: 1.0000 mg | ORAL_TABLET | Freq: Once | ORAL | Status: DC

## 2020-09-14 MED ORDER — IBUPROFEN 400 MG PO TABS
400.0000 mg | ORAL_TABLET | Freq: Once | ORAL | Status: AC
  Administered 2020-09-14: 400 mg via ORAL
  Filled 2020-09-14: qty 1

## 2020-09-14 MED ORDER — ONDANSETRON 4 MG PO TBDP
4.0000 mg | ORAL_TABLET | Freq: Once | ORAL | Status: AC
  Administered 2020-09-14: 4 mg via ORAL
  Filled 2020-09-14: qty 1

## 2020-09-14 MED ORDER — LORAZEPAM 1 MG PO TABS
1.0000 mg | ORAL_TABLET | Freq: Once | ORAL | Status: AC
  Administered 2020-09-14: 1 mg via ORAL
  Filled 2020-09-14: qty 1

## 2020-09-14 MED ORDER — METHOCARBAMOL 500 MG PO TABS
500.0000 mg | ORAL_TABLET | Freq: Three times a day (TID) | ORAL | Status: DC
  Administered 2020-09-14 – 2020-09-15 (×3): 500 mg via ORAL
  Filled 2020-09-14 (×3): qty 1

## 2020-09-14 MED ORDER — HYDROXYZINE HCL 25 MG PO TABS
25.0000 mg | ORAL_TABLET | Freq: Once | ORAL | Status: AC
  Administered 2020-09-14: 25 mg via ORAL
  Filled 2020-09-14: qty 1

## 2020-09-14 NOTE — ED Notes (Signed)
Lunch Tray Ordered @ 1105. 

## 2020-09-14 NOTE — ED Notes (Signed)
During pt's wait in lobby pt reports that he is suicidal. Pt states that he is suicidal because, "he screwed everything up."

## 2020-09-14 NOTE — Consult Note (Signed)
Denies suicidal/homicidal ideations, hallucinations.  He is having some leg cramps, Robaxin ordered.  Care Management is seeking a bed at [facility] where he wants to continue his recovery.  Patient agreeable to the plan to discharge and go to [facility].  He does not have a ride there and a TOC consult placed to facilitate transportation.  Nanine Means, PMHNP

## 2020-09-14 NOTE — BH Assessment (Signed)
Comprehensive Clinical Assessment (CCA) Note  09/14/2020 Ryan Kelley [id number]   Patient presented to [facility] last night seeking detox services for his heroin and benzo addiction.  Patient states that he attempted to detox at the [facility], but states that he was held downstairs for 24 hours and was never taken up to the [facility] and he was not started on medications and he was going into withdrawal, so he left AMA and came to the [facility].  Prior to coming into the facility, he overdosed in the parting lot and had to be sent to the [facility] ED for Medical Clearance.  Patient has been in the ED overnight.  Patient states that he was upset with himself last night and made some passive suicidal statements, but states that he is not suicidal and states that he has never tried to hurt himself in the past and states that he has never been on an inpatient psychiatric unit in the past.  Patient states that he is not homicidal or psychotic.  He states that he has been abusing heroin for the past two years an state that he has been using at least a half a gram daily.  Patient states that he has been taking 5 unknown Benzodiazepine pills every other day and states that he occasionally smokes marijuana.  Patient states that he has not been eating or sleeping well.  He denies any history of abuse or self-mutilation.  Patient states that he really does not need psychiatric services.  He states that he just needs to get into detox and then go to a longer term program that will help him learn new coping strategies and to stay off drugs.  Patient presents as oriented and alert.  He is moderately anxious.  His judgement, insight and impulse control are characteristically impaired due to his drug use.  He does not appear to be responding to any internal stimuli.  His thoughts appear to be organized and his memory is intact.  Chief Complaint:  Chief Complaint  Patient presents with  . Medical Clearance  .  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 Self  Referral name: Ryan Kelley  Referral phone number: [phone]   Whom do you see for routine medical problems? I don't have a doctor  Practice/Facility Name: No data recorded Practice/Facility Phone Number: No data recorded Name of Contact: No data recorded Contact Number: No data recorded Contact Fax Number: No data recorded Prescriber Name: No data recorded Prescriber Address (if known): No data recorded  What Is the Reason for Your Visit/Call Today? Patient states that he is in need of detox from opioids and benzodiazepines  How Long Has This Been Causing You Problems? > than 6 months  What Do You Feel Would Help You the Most Today? Other (Comment) ([facility] detox)   Have You Recently Been in Any Inpatient Treatment (Hospital/Detox/Crisis Center/28-Day Program)? Yes  Name/Location of Program/Hospital:Has been to [facility], [facility] and [facility]  How Long Were You There? 1 week  When Were You Discharged?  ([facility] approximately one month ago)   Have You Ever Received Services From [facility] [facility] Before? Yes  Who Do You See  at [facility]? [facility] and ED visits   Have You Recently Had Any Thoughts About Hurting Yourself? No  Are You Planning to Commit Suicide/Harm Yourself At This time? No   Have you Recently Had Thoughts About Hurting Someone Ryan Kelley? No  Explanation: I Thought About Harming My Aunt For Lies She Recently Told Me (Ryan Kelley)   Have You Used Any Alcohol or Drugs in the Past 24 Hours? Yes  How Long Ago Did You Use Drugs or Alcohol? 1755  What Did You Use and How Much? Heroin and Benzos, overdosed in [facility] parking lot last night   Do You Currently Have a Therapist/Psychiatrist? No  Name of  Therapist/Psychiatrist: No data recorded  Have You Been Recently Discharged From Any Office Practice or Programs? No  Explanation of Discharge From Practice/Program: My Aunt Lied About My Cousins Health To Persuade Me To  [facility]charge From [facility]ox[facility] For Ryan Kelley (Ryan Kelley)     CCA Screening Triage Referral Assessment Type of Contact: Tele-Assessment  Is this Initial or Reassessment? Initial Assessment  Date Telepsych consult ordered in [facility]:  09/14/2020  Time Telepsych consult ordered in [facility]:  0607   Patient Reported Information Reviewed? Yes  Patient Left Without Being Seen? No data recorded Reason for Not Completing Assessment: No data recorded  Collateral Involvement: none reported   Does Patient Have a Court Appointed Legal Guardian? No data recorded Name and Contact of Legal Guardian: Self  If Minor and Not Living with Parent(s), Who has Custody? n/a  Is CPS involved or ever been involved? Never  Is APS involved or ever been involved? Never   Patient Determined To Be At Risk for Harm To Self or Others Based on Review of Patient Reported Information or Presenting Complaint? No (patient denies any current suicidal information)  Method: No data recorded Availability of Means: No data recorded Intent: No data recorded Notification Required: No data recorded Additional Information for Danger to Others Potential: No data recorded Additional Comments for Danger to Others Potential: No data recorded Are There Guns or Other Weapons in Your Home? No data recorded Types of Guns/Weapons: No data recorded Are These Weapons Safely Secured?                            No data recorded Who Could Verify You Are Able To Have These Secured: No data recorded Do You Have any Outstanding Charges, Pending Court Dates, Parole/Probation? No data recorded Contacted To Inform of Risk of Harm To Self or Others: No data recorded  Location of Assessment:  [facility] ED   Does Patient Present under Involuntary Commitment? No  IVC Papers Initial File Date: No data recorded  [place] of Residence: [place]   Patient Currently Receiving the Following Services: -- (Patient requesting inpatient detox)   Determination of Need: Emergent (2 hours)   Options For Referral: -- (Medically Managed Detox)     CCA Biopsychosocial Intake/Chief Complaint:  Patient presented to [facility] last night seeking detox services for his heroin and benzo addiction.  Patient states that he attempted to detox at the [facility], but states that he was held downstairs for 24 hours and was never taken up to the [facility] and he was not started on medications and he was going into withdrawal, so he left AMA and came to the [facility].  Prior to coming into the facility, he overdosed in the parting lot and had to  be sent to the [facility] ED for Medical Clearance.  Patient has been in the ED overnight.  Patient states that he was upset with himself last night and made some passive suicidal statements, but states that he is not suicidal and states that he has never tried to hurt himself in the past and states that he has never been on an inpatient psychiatric unit in the past.  Patient states that he is not homicidal or psychotic.  He states that he has been abusing heroin for the past two years an state that he has been using at least a half a gram daily.  Patient states that he has been taking 5 unknown Benzodiazepine pills every other day and states that he occasionally smokes marijuana.  Patient states that he has not been eating or sleeping well.  He denies any history of abuse or self-mutilation.  Patient states that he really does not need psychiatric services.  He states that he just needs to get into detox and then go to a longer term program that will help him learn new coping strategies and to stay off drugs,  Current Symptoms/Problems: Patient denies any current  depressive symptoms other than sleep and appetite disturbance and feeling hopeless at times   Patient Reported Schizophrenia/Schizoaffective Diagnosis in Past: No   Strengths: Patient states that he tries to make the best of situations  Preferences: Patient is requesting a referral for detox  Abilities: Patient states that he is good at drwing and writing poetry   Type of Services Patient Feels are Needed: Patient is requesting a referral to a detox facility   Initial Clinical Notes/Concerns: No data recorded  Mental Health Symptoms Depression:  Hopelessness; Increase/decrease in appetite; Sleep (too much or little)   Duration of Depressive symptoms: Less than two weeks   Mania:  N/A   Anxiety:   Difficulty concentrating; Irritability; Restlessness; Sleep; Worrying; Tension   Psychosis:  None   Duration of Psychotic symptoms: Less than six months   Trauma:  N/A   Obsessions:  N/A   Compulsions:  N/A   Inattention:  N/A   Hyperactivity/Impulsivity:  N/A   Oppositional/Defiant Behaviors:  N/A   Emotional Irregularity:  Chronic feelings of emptiness   Other Mood/Personality Symptoms:  No data recorded   Mental Status Exam Appearance and self-care  Stature:  Average   Weight:  Average weight   Clothing:  Neat/clean; Casual   Grooming:  Normal   Cosmetic use:  None   Posture/gait:  Tense   Motor activity:  Not Remarkable   Sensorium  Attention:  Normal   Concentration:  Normal   Orientation:  Place; Person; Situation; Time; Object   Recall/memory:  Normal   Affect and Mood  Affect:  Anxious   Mood:  Anxious   Relating  Eye contact:  Normal   Facial expression:  Tense   Attitude toward examiner:  Cooperative   Thought and Language  Speech flow: Soft; Slurred   Thought content:  Appropriate to Mood and Circumstances   Preoccupation:  None   Hallucinations:  None   Organization:  No data recorded  [employer] of  Knowledge:  Fair   Intelligence:  Average   Abstraction:  Normal   Judgement:  Fair   Dance movement psychotherapist:  Adequate   Insight:  Fair   Decision Making:  Normal   Social Functioning  Social Maturity:  Irresponsible   Social Judgement:  "Street Smart"   Stress  Stressors:  Financial; Relationship; Family conflict   Coping Ability:  Deficient supports   Skill Deficits:  Activities of daily living; Interpersonal; Self-control   Supports:  Friends/Service system     Religion: Religion/Spirituality Are You A Religious Person?:  (not assessed)  Leisure/Recreation: Leisure / Recreation Do You Have Hobbies?: No  Exercise/Diet: Exercise/Diet Do You Exercise?: No Have You Gained or Lost A Significant Amount of Weight in the Past Six Months?: No Do You Follow a Special Diet?: No Do You Have Any Trouble Sleeping?: Yes Explanation of Sleeping Difficulties: unable to sleep   CCA Employment/Education Employment/Work Situation: Employment / Work Situation Employment situation: Unemployed Patient's job has been impacted by current illness: No What is the longest time patient has a held a job?: 3 years Where was the patient employed at that time?: [employer] Has patient ever been in the [employer]?: No  Education: Education Is Patient Currently Attending School?: No Last Grade Completed: 12 Name of High School:  High Did Garment/textile technologist From [employer]?: Yes Did Theme park manager?: No Did You Have An Individualized Education Program (IIEP): No Did You Have Any Difficulty At [employer]?: No Patient's Education Has Been Impacted by Current Illness: No   CCA Family/Childhood History Family and Relationship History: Family history Marital status: Married Number of Years Married: 10 What types of issues is patient dealing with in the relationship?: stressed due to his drug use Additional relationship information: UTA Are you sexually active?: Yes What is your  sexual orientation?: Heterosexual Has your sexual activity been affected by drugs, alcohol, medication, or emotional stress?: UTA Does patient have children?: No  Childhood History:  Childhood History By whom was/is the patient raised?: Mother Additional childhood history information: Pt states that he had a good childhood.  Description of patient's relationship with caregiver when they were a child: Pt reports getting along well with parents growing up. Parents were divorced Patient's description of current relationship with people who raised him/Ryan Kelley: Patient states that he is still close to his parents How were you disciplined when you got in trouble as a child/adolescent?: Patient denies any history of abuse and states that he was disciplined appropriately Does patient have siblings?: Yes Number of Siblings: 1 Description of patient's current relationship with siblings: Brother is deceased Did patient suffer any verbal/emotional/physical/sexual abuse as a child?: No Did patient suffer from severe childhood neglect?: No Has patient ever been sexually abused/assaulted/raped as an adolescent or adult?: No Was the patient ever a victim of a crime or a disaster?: No Witnessed domestic violence?: No Has patient been affected by domestic violence as an adult?: No  Child/Adolescent Assessment:     CCA Substance Use Alcohol/Drug Use: Alcohol / Drug Use Pain Medications: See MAR Prescriptions: See MAR Over the Counter: See MAR History of alcohol / drug use?: Yes Longest period of sobriety (when/how long): unknown Negative Consequences of Use: Financial,Personal relationships Withdrawal Symptoms: Agitation,Irritability,Diarrhea,Nausea / Vomiting Substance #1 Name of Substance 1: Heroin 1 - Age of First Use: 32 1 - Amount (size/oz): 1/2 gram 1 - Frequency: daily 1 - Duration: 2 years 1 - Last Use / Amount: last pm Substance #2 Name of Substance 2: Benzodiazepines 2 - Age of First  Use: 10 2 - Amount (size/oz): 3.5-7 mg 2 - Frequency: 2-3 weekly 2 - Duration: ongoing 2 - Last Use / Amount: Last night                     ASAM's:  Six Dimensions  of Multidimensional Assessment  Dimension 1:  Acute Intoxication and/or Withdrawal Potential:   Dimension 1:  Description of individual's past and current experiences of substance use and withdrawal: Patient states that he has complications with withdrawal symptoms and requires medically monitored detox  Dimension 2:  Biomedical Conditions and Complications:   Dimension 2:  Description of patient's biomedical conditions and  complications: Patient is using a veryu dangerous combination of drugs that place him at high rosk for overdose  Dimension 3:  Emotional, Behavioral, or Cognitive Conditions and Complications:  Dimension 3:  Description of emotional, behavioral, or cognitive conditions and complications: Patient has very poor coping mechanisms and uses drugs to self-medicate his emotional and mental health issues  Dimension 4:  Readiness to Change:  Dimension 4:  Description of Readiness to Change criteria: Patient appears to has reservations and has been jumping from treatment center to treatment center and leaving AMA and using the same day he is discharged  Dimension 5:  Relapse, Continued use, or Continued Problem Potential:  Dimension 5:  Relapse, continued use, or continued problem potential critiera description: Patient has a history of chronic relapses and failed treatment episodes  Dimension 6:  Recovery/Living Environment:  Dimension 6:  Recovery/Iiving environment criteria description: Patient is essentially homeless at this point and he has exhausted most all of his support from others  ASAM Severity Score: ASAM's Severity Rating Score: 16  ASAM Recommended Level of Treatment: ASAM Recommended Level of Treatment: Level III Residential Treatment   Substance use Disorder (SUD) Substance Use Disorder (SUD)   Checklist Symptoms of Substance Use: Continued use despite persistent or recurrent social, interpersonal problems, caused or exacerbated by use,Evidence of tolerance,Large amounts of time spent to obtain, use or recover from the substance(s),Presence of craving or strong urge to use,Recurrent use that results in a failure to fulfill major role obligations (work, school, home),Substance(s) often taken in larger amounts or over longer times than was intended,Persistent desire or unsuccessful efforts to cut down or control use,Social, occupational, recreational activities given up or reduced due to use  Recommendations for Services/Supports/Treatments: Recommendations for Services/Supports/Treatments Recommendations For Services/Supports/Treatments: Residential-Level 3  DSM5 Diagnoses: Patient Active Problem List   Diagnosis Date Noted  . Malingering 08/25/2020  . Polysubstance abuse (HCC) Kelley  . Severe benzodiazepine use disorder (HCC) 05/27/2020  . 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 Opioid withdrawal (HCC) 07/08/2016  . Substance induced mood disorder (HCC) 12/05/2013  . Opioid use disorder, severe, dependence (HCC) 10/18/2012  . Opioid use with withdrawal (HCC) 10/18/2012    Disposition:  Per Nanine MeansJamison Lord, NP, patient can be psych cleared in order to go to a detox facility.   Referrals to Alternative Service(s): Referred to Alternative Service(s):   Place:   Date:   Time:    Referred to Alternative Service(s):   Place:   Date:   Time:    Referred to Alternative Service(s):   Place:   Date:   Time:    Referred to Alternative Service(s):   Place:   Date:   Time:     Danny J Sprinkle, LCAS

## 2020-09-14 NOTE — ED Provider Notes (Signed)
[facility] EMERGENCY DEPARTMENT Provider Note   CSN: [id number] Arrival date & time: 09/13/20  2131     History Chief Complaint  Patient presents with  . Medical Clearance    Ryan Kelley is a 35 y.o. male.  Patient presents to the emergency department with a chief complaint of suicidal thoughts.  He states that he is addicted to heroin.  Reports that he overdosed yesterday, and then went to a detox center.  He was referred to the emergency department for medical clearance.  He states that he feels suicidal because he cannot escape the heroin addiction.  He denies any other drug or alcohol use.  Denies any recent illnesses, including fever, chills, cough.  He does not take any medications for his depression/anxiety.  He states that the drug use has caused him to lose everything.  The history is provided by the patient. No language interpreter was used.       Past Medical History:  Diagnosis Date  . Anxiety   . Attempted suicide (HCC)   . Carpal tunnel syndrome   . Chronic back pain   . Chronic knee pain   . Depression   . Insomnia   . PTSD (post-traumatic stress disorder)   . Substance abuse [facility])     Patient Active Problem List   Diagnosis Date Noted  . Malingering 08/25/2020  . Polysubstance abuse (HCC) 08/21/2020  . Benzodiazepine abuse (HCC) 05/27/2020  . 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 Opioid withdrawal (HCC) 07/08/2016  . Substance induced mood disorder (HCC) 12/05/2013  . Opioid dependence (HCC) 10/18/2012  . Opioid use with withdrawal (HCC) 10/18/2012    No past surgical history on file.     Family History  Problem Relation Age of Onset  . Diabetes Mother   . Cancer Mother   . Hypertension Mother   . Hypertension Father     Social History   Tobacco Use  . Smoking status: Current Every Day Smoker    Packs/day: 1.00    Years: 7.00    Pack years: 7.00    Types: Cigarettes  . Smokeless tobacco: Never Used  Substance Use  Topics  . Alcohol use: Yes    Comment: occasionally   . Drug use: Yes    Frequency: 5.0 times per week    Types: Hydrocodone, Oxycodone, Benzodiazepines, Marijuana    Comment: heroin last used 08/23/20    Home Medications Prior to Admission medications   Medication Sig Start Date End Date Taking? Authorizing Provider  FLUoxetine (PROZAC) 20 MG capsule Take 1 capsule (20 mg total) by mouth daily. 08/22/20 09/21/20  Estella Husk, MD  SUMAtriptan (IMITREX) 50 MG tablet May repeat in 2 hours if headache persists or recurs.  Do not exceed more than 4 tablets in 24 hours Patient not taking: Reported on 11/25/2015 03/13/15 12/26/15  Burgess Amor, PA-C    Allergies    Poison ivy extract [poison ivy extract]  Review of Systems   Review of Systems  All other systems reviewed and are negative.   Physical Exam Updated Vital Signs BP 112/66 (BP Location: Left Arm)   Pulse 92   Temp 98.4 F (36.9 C) (Oral)   Resp 16   SpO2 97%   Physical Exam Vitals and nursing note reviewed.  Constitutional:      Appearance: He is well-developed and well-nourished.  HENT:     Head: Normocephalic and atraumatic.  Eyes:     Conjunctiva/sclera: Conjunctivae normal.  Cardiovascular:  Rate and Rhythm: Normal rate and regular rhythm.     Heart sounds: No murmur heard.   Pulmonary:     Effort: Pulmonary effort is normal. No respiratory distress.     Breath sounds: Normal breath sounds.  Abdominal:     Palpations: Abdomen is soft.     Tenderness: There is no abdominal tenderness.  Musculoskeletal:        General: No edema. Normal range of motion.     Cervical back: Neck supple.  Skin:    General: Skin is warm and dry.  Neurological:     Mental Status: He is alert and oriented to person, place, and time.  Psychiatric:        Mood and Affect: Mood and affect and mood normal.        Behavior: Behavior normal.     ED Results / Procedures / Treatments   Labs (all labs ordered are listed,  but only abnormal results are displayed) Labs Reviewed  COMPREHENSIVE METABOLIC PANEL - Abnormal; Notable for the following components:      Result Value   Glucose, Bld 122 (*)    Calcium 8.7 (*)    All other components within normal limits  CBC - Abnormal; Notable for the following components:   WBC 10.7 (*)    All other components within normal limits  RAPID URINE DRUG SCREEN, HOSP PERFORMED - Abnormal; Notable for the following components:   Benzodiazepines POSITIVE (*)    Tetrahydrocannabinol POSITIVE (*)    All other components within normal limits  RESP PANEL BY RT-PCR (FLU A&B, COVID) ARPGX2  ETHANOL    EKG None  Radiology No results found.  Procedures Procedures   Medications Ordered in ED Medications  LORazepam (ATIVAN) tablet 1 mg (has no administration in time range)  ibuprofen (ADVIL) tablet 400 mg (400 mg Oral Given 09/14/20 0033)    ED Course  I have reviewed the triage vital signs and the nursing notes.  Pertinent labs & imaging results that were available during my care of the patient were reviewed by me and considered in my medical decision making (see chart for details).    MDM Rules/Calculators/A&P                          Patient here with suicidal thoughts.  Also has history of heroin abuse.  Sent here by [facility] for medical screening.  Medical screening is complete.  Patient appears medically clear for TTS evaluation.  Dispo per TTS. Final Clinical Impression(s) / ED Diagnoses Final diagnoses:  Suicidal ideation  Medical clearance for psychiatric admission    Rx / DC Orders ED Discharge Orders    None       Roxy Horseman, PA-C 09/14/20 4037    Shon Baton, MD 09/14/20 2318

## 2020-09-14 NOTE — ED Provider Notes (Signed)
Emergency Medicine Observation Re-evaluation Note  Ryan Kelley is a 35 y.o. male, seen on rounds today.  Pt initially presented to the ED for complaints of Medical Clearance Currently, the patient is calm, sitting in hallway bed.  He was medically cleared earlier this morning.  Patient states he does feel anxious and is nauseous.  Physical Exam  BP 112/66 (BP Location: Left Arm)   Pulse 92   Temp 98.4 F (36.9 C) (Oral)   Resp 16   SpO2 97%  Physical Exam General: No acute distress MSK: Moves all extremities without difficulty Neuro: Ambulatory without difficulty CN 2-12 grossly intact Psych: Anxious  ED Course / MDM  EKG:    I have reviewed the labs performed to date as well as medications administered while in observation.  Recent changes in the last 24 hours include none  Plan  Current plan is for TTS evaluation. Will treat anxiety and nausea in meantime. Patient is not under full IVC at this time.   Kavish Lafitte A, PA-C 09/14/20 1422    Milagros Loll, MD 09/15/20 [phone]

## 2020-09-14 NOTE — Care Management (Signed)
Writer faxed referral to [facility] [phone] Fax.    Writer spoke to the intake person at the facility [phone]).

## 2020-09-14 NOTE — ED Notes (Signed)
Pt resting in triage in a recliner.  Calm and cooperative. Awaiting to see provider

## 2020-09-15 ENCOUNTER — Ambulatory Visit (HOSPITAL_COMMUNITY)
Admission: EM | Admit: 2020-09-15 | Discharge: 2020-09-15 | Disposition: A | Discharge status: Home / Self Care | Payer: No Payment, Other

## 2020-09-15 ENCOUNTER — Other Ambulatory Visit: Payer: Self-pay

## 2020-09-15 ENCOUNTER — Ambulatory Visit: Payer: Self-pay | Admitting: *Deleted

## 2020-09-15 DIAGNOSIS — F1994 Other psychoactive substance use, unspecified with psychoactive substance-induced mood disorder: Secondary | ICD-10-CM

## 2020-09-15 NOTE — BH Assessment (Addendum)
Patient was just psych cleared and discharged from [facility] earlier today.  His plan was to enter the [facility] program for residential SA treatment.  Patient presents to [facility] and initially stated he was suicidal if he couldn't get to treatment.  He has since recanted and affirms his safety, stating he can stay with his mother and feels safe with this plan.  He denies SI, HI and AVH. Patient requests assistance with referral to [facility], stating a referral will ensure a bed.  LPC will contact [facility] and consult with SW regarding referral process.   Update: 17:15 LPC spoke with Melissa of [facility].  They just had two discharges and patient has been accepted.  Patient informed that he has a bed and to keep in touch with staff regarding his arrival time.  He states his mother, who is here with him, will likely take him tonight.

## 2020-09-15 NOTE — Discharge Instructions (Addendum)
Return to the ER if you start to experience thoughts of wanting to hurt yourself or others, hearing voices or seeing things that are not there. You are clear from a medical and psychiatric stand point to follow up at [facility]. I have informed your legal guardian, [name] about your discharge.

## 2020-09-15 NOTE — ED Provider Notes (Signed)
Behavioral Health Urgent Care Medical Screening Exam  Patient Name: Ryan Kelley MRN: [id number] Date of Evaluation: 09/15/20 Chief Complaint:   Diagnosis:  Final diagnoses:  None    History of Present illness: Ryan Kelley is a 35 y.o. male presents to [facility] urgent care facility accompanied by his mother.  He reports "I think I am suicidal and homicidal again because I was unable to get into the [facility] in [place]".  Later on in the assessment patient recanted suicidal or homicidal ideations as our Child psychotherapist is working to place a referral in order for patient to secure a bed for detox.  She was psychiatrically cleared this morning from [facility] ED.  Patient remains site cleared at this time.  Patient appears to be malingering.case staffed with attending psychiatrist Lucianne Muss.  Support, encouragement and reassurance was provided.  Psychiatric Specialty Exam  Presentation  General Appearance:Disheveled  Eye Contact:Fair  Speech:Normal Rate  Speech Volume:Normal  Handedness:Right   Mood and Affect  Mood:Anxious  Affect:Congruent   Thought Process  Thought Processes:Coherent; Goal Directed; Linear  Descriptions of Associations:Intact  Orientation:Full (Time, Place and Person)  Thought Content:WDL  Hallucinations:None Voices telling him to kill himself and his aunt Voices telling him to kill himself and his aunt demons  Ideas of Reference:None  Suicidal Thoughts:Yes, Active With Intent; Without Plan  Homicidal Thoughts:No Without Intent; Without Plan   Sensorium  Memory:Immediate Fair; Recent Fair; Remote Fair  Judgment:Poor  Insight:Lacking   Executive Functions  Concentration:Fair  Attention Span:Fair  Recall:Fair  Fund of Knowledge:Fair  Language:Fair   Psychomotor Activity  Psychomotor Activity:Normal   Assets  Assets:Communication Skills; Desire for Improvement; Housing; Leisure Time; Physical Health   Sleep   Sleep:Fair  Number of hours: No data recorded  Physical Exam: Physical Exam ROS Blood pressure 136/79, pulse (!) 114, temperature 98.3 F (36.8 C), temperature source Oral, resp. rate 18, height 5\' 6"  (1.676 m), weight 100.7 kg, SpO2 94 %. Body mass index is 35.83 kg/m.  Musculoskeletal: Strength & Muscle Tone: within normal limits Gait & Station: normal Patient leans: N/A   [facility]UC MSE Discharge Disposition for Follow up and Recommendations: Based on my evaluation the patient does not appear to have an emergency medical condition and can be discharged with resources and follow up care in outpatient services for Substance Abuse Intensive Outpatient Program   -TTS counselor to provide additional outpatient resources  And will attempt to follow-up with  [facility] referral.    , NP 09/15/2020, 5:02 PM

## 2020-09-15 NOTE — ED Notes (Signed)
Pt belongings given back to pt

## 2020-09-15 NOTE — ED Notes (Signed)
Breakfast order placed ?

## 2020-09-15 NOTE — ED Notes (Signed)
Pt discharged in good spirits as a bed became available at [facility]. Pt left with mother on way to detox facility. Safety maintained.

## 2020-09-15 NOTE — Discharge Instructions (Signed)
Take all medications as prescribed. Keep all follow-up appointments as scheduled.  Do not consume alcohol or use illegal drugs while on prescription medications. Report any adverse effects from your medications to your primary care provider promptly.  In the event of recurrent symptoms or worsening symptoms, call 911, a crisis hotline, or go to the nearest emergency department for evaluation.   

## 2020-09-15 NOTE — Telephone Encounter (Signed)
Pt was referred to the Behavioral Health Urgent Care for [facility]. Residents from the ED.  He is unable to locate it and called in requesting help finding it.   I looked it up on line and gave him the address [address]  [place] and let him know it was a 24/7 walk in clinic for behavioral health needs.  He did not need an appt.    He wrote it down and is putting it into his GPS.   He thanked me over and over for being patient with him and helping him find the place.   He had someone in the car with him that he was asking for a pencil and paper who was assisting him. I let him know to call us back if he got lost again or needed additional help.   I gave him our number [phone] instructed him to ask for a nurse who could help him.

## 2020-09-15 NOTE — ED Notes (Signed)
Per behavioral NP she states pt can go home.

## 2020-09-15 NOTE — ED Provider Notes (Addendum)
Emergency Medicine Observation Re-evaluation Note  Ryan Kelley is a 35 y.o. male, seen on rounds today.  Pt initially presented to the ED for complaints of Medical Clearance and Addiction Problem Currently, the patient is without any complaints.  Physical Exam  BP 108/60 (BP Location: Right Arm)   Pulse (!) 53   Temp 98.4 F (36.9 C) (Oral)   Resp 16   SpO2 95%    ED Course / MDM  EKG:    I have reviewed the labs performed to date as well as medications administered while in observation.  Recent changes in the last 24 hours include patient cleared by psychiatry.  Denying any suicidal, homicidal ideations or hallucinations.  He is requesting discharge to [facility] and psychiatry team is in agreement.  Plan  Current plan is for discharge. I have contacted legal guardian regarding his discharge. Patient is not under full IVC at this time.   Portions of this note were generated with Scientist, clinical (histocompatibility and immunogenetics). Dictation errors may occur despite best attempts at proofreading.     Dietrich Pates, PA-C 09/15/20 1048    Milagros Loll, MD 09/15/20 [phone]

## 2020-10-24 ENCOUNTER — Emergency Department
Admission: EM | Admit: 2020-10-24 | Discharge: 2020-10-27 | Disposition: A | Discharge status: Home / Self Care | Payer: Self-pay | Attending: Emergency Medicine | Admitting: Emergency Medicine

## 2020-10-24 ENCOUNTER — Other Ambulatory Visit: Payer: Self-pay

## 2020-10-24 DIAGNOSIS — F32A Depression, unspecified: Secondary | ICD-10-CM

## 2020-10-24 DIAGNOSIS — Z765 Malingerer [conscious simulation]: Secondary | ICD-10-CM | POA: Insufficient documentation

## 2020-10-24 DIAGNOSIS — F1310 Sedative, hypnotic or anxiolytic abuse, uncomplicated: Secondary | ICD-10-CM | POA: Insufficient documentation

## 2020-10-24 DIAGNOSIS — F17210 Nicotine dependence, cigarettes, uncomplicated: Secondary | ICD-10-CM | POA: Insufficient documentation

## 2020-10-24 DIAGNOSIS — F339 Major depressive disorder, recurrent, unspecified: Secondary | ICD-10-CM | POA: Insufficient documentation

## 2020-10-24 DIAGNOSIS — R45851 Suicidal ideations: Secondary | ICD-10-CM | POA: Insufficient documentation

## 2020-10-24 DIAGNOSIS — F1120 Opioid dependence, uncomplicated: Secondary | ICD-10-CM | POA: Diagnosis present

## 2020-10-24 DIAGNOSIS — F1994 Other psychoactive substance use, unspecified with psychoactive substance-induced mood disorder: Secondary | ICD-10-CM | POA: Insufficient documentation

## 2020-10-24 DIAGNOSIS — F4310 Post-traumatic stress disorder, unspecified: Secondary | ICD-10-CM | POA: Insufficient documentation

## 2020-10-24 DIAGNOSIS — F1110 Opioid abuse, uncomplicated: Secondary | ICD-10-CM | POA: Insufficient documentation

## 2020-10-24 DIAGNOSIS — Z20822 Contact with and (suspected) exposure to covid-19: Secondary | ICD-10-CM | POA: Insufficient documentation

## 2020-10-24 DIAGNOSIS — F1320 Sedative, hypnotic or anxiolytic dependence, uncomplicated: Secondary | ICD-10-CM | POA: Diagnosis present

## 2020-10-24 LAB — CBC
HCT: 45.4 % (ref 39.0–52.0)
Hemoglobin: 16 g/dL (ref 13.0–17.0)
MCH: 30.6 pg (ref 26.0–34.0)
MCHC: 35.2 g/dL (ref 30.0–36.0)
MCV: 86.8 fL (ref 80.0–100.0)
Platelets: 298 10*3/uL (ref 150–400)
RBC: 5.23 MIL/uL (ref 4.22–5.81)
RDW: 12.4 % (ref 11.5–15.5)
WBC: 10 10*3/uL (ref 4.0–10.5)
nRBC: 0 % (ref 0.0–0.2)

## 2020-10-24 LAB — COMPREHENSIVE METABOLIC PANEL
ALT: 46 U/L — ABNORMAL HIGH (ref 0–44)
AST: 39 U/L (ref 15–41)
Albumin: 4.7 g/dL (ref 3.5–5.0)
Alkaline Phosphatase: 70 U/L (ref 38–126)
Anion gap: 11 (ref 5–15)
BUN: 11 mg/dL (ref 6–20)
CO2: 23 mmol/L (ref 22–32)
Calcium: 9.4 mg/dL (ref 8.9–10.3)
Chloride: 101 mmol/L (ref 98–111)
Creatinine, Ser: 0.72 mg/dL (ref 0.61–1.24)
GFR, Estimated: >60 mL/min (ref >60)
Glucose, Bld: 103 mg/dL — ABNORMAL HIGH (ref 70–99)
Potassium: 3.6 mmol/L (ref 3.5–5.1)
Sodium: 135 mmol/L (ref 135–145)
Total Bilirubin: 0.6 mg/dL (ref 0.3–1.2)
Total Protein: 7.8 g/dL (ref 6.5–8.1)

## 2020-10-24 LAB — SALICYLATE LEVEL: Salicylate Lvl: <7 mg/dL — ABNORMAL LOW (ref 7.0–30.0)

## 2020-10-24 LAB — ETHANOL: Alcohol, Ethyl (B): <10 mg/dL (ref <10)

## 2020-10-24 LAB — ACETAMINOPHEN LEVEL: Acetaminophen (Tylenol), Serum: <10 ug/mL — ABNORMAL LOW (ref 10–30)

## 2020-10-24 MED ORDER — LORAZEPAM 2 MG PO TABS
2.0000 mg | ORAL_TABLET | Freq: Once | ORAL | Status: AC
  Administered 2020-10-24: 2 mg via ORAL
  Filled 2020-10-24: qty 1

## 2020-10-24 MED ORDER — NICOTINE 14 MG/24HR TD PT24
14.0000 mg | MEDICATED_PATCH | Freq: Once | TRANSDERMAL | Status: AC
  Administered 2020-10-24: 14 mg via TRANSDERMAL
  Filled 2020-10-24: qty 1

## 2020-10-24 NOTE — ED Notes (Signed)
Patient belongings: Red shirt  Pair of tennis shoes  Pair of black/gray socks  One black watch  One ball cap One black wedding ring  2 lighters

## 2020-10-24 NOTE — ED Notes (Signed)
Pt reports that if mothe,r [place], or if wife, Lathen Seal, calls it's ok to update with info.  Pt oriented to phone use and visitation times

## 2020-10-24 NOTE — ED Provider Notes (Signed)
[facility] Emergency Department Provider Note  ____________________________________________   Event Date/Time   First MD Initiated Contact with Patient 10/24/20 2255     (approximate)  I have reviewed the triage vital signs and the nursing notes.   HISTORY  Chief Complaint Suicidal    HPI Ryan Kelley is a 35 y.o. male with medical history as listed below which notably includes depression, PTSD, prior suicide attempt(s), substance abuse, and malingering as per notes from [facility] psychiatry.  He presents tonight voluntarily reporting persistent drug abuse (heroin) which is led him to be severely depressed and wants to kill himself.  He said he thinks his wife is getting ready to leave and which makes it worse.  The symptoms been gradual in onset, severe, nothing in particular makes it better or worse.  He said he drank some alcohol earlier today but it was 8 or 9 hours ago and he does not regularly drink alcohol.  Occasionally uses some benzos but mostly he abuses heroin which he last used this morning.  He denies any medical complaints or concerns.  He said he feels very agitated and anxious right now.  He uses at least a pack of cigarettes a day as well.  He denies chest pain, shortness of breath, nausea, vomiting, abdominal pain.          Past Medical History:  Diagnosis Date  . Anxiety   . Attempted suicide (HCC)   . Carpal tunnel syndrome   . Chronic back pain   . Chronic knee pain   . Depression   . Insomnia   . PTSD (post-traumatic stress disorder)   . Substance abuse [facility])     Patient Active Problem List   Diagnosis Date Noted  . Malingering 08/25/2020  . Polysubstance abuse (HCC) 08/21/2020  . Severe benzodiazepine use disorder (HCC) 05/27/2020  . 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 Opioid withdrawal (HCC) 07/08/2016  . Substance induced mood disorder (HCC) 12/05/2013  . Opioid use disorder, severe, dependence (HCC) 10/18/2012  .  Opioid use with withdrawal (HCC) 10/18/2012    History reviewed. No pertinent surgical history.  Prior to Admission medications   Medication Sig Start Date End Date Taking? Authorizing Provider  FLUoxetine (PROZAC) 20 MG capsule Take 1 capsule (20 mg total) by mouth daily. 08/22/20 09/21/20  Estella Husk, MD  traZODone (DESYREL) 50 MG tablet Take 50 mg by mouth at bedtime.    [provider]  SUMAtriptan (IMITREX) 50 MG tablet May repeat in 2 hours if headache persists or recurs.  Do not exceed more than 4 tablets in 24 hours Patient not taking: Reported on 11/25/2015 03/13/15 12/26/15  Burgess Amor, PA-C    Allergies Poison ivy extract [poison ivy extract]  Family History  Problem Relation Age of Onset  . Diabetes Mother   . Cancer Mother   . Hypertension Mother   . Hypertension Father     Social History Social History   Tobacco Use  . Smoking status: Current Every Day Smoker    Packs/day: 1.00    Years: 7.00    Pack years: 7.00    Types: Cigarettes  . Smokeless tobacco: Never Used  Substance Use Topics  . Alcohol use: Yes    Comment: occasionally   . Drug use: Yes    Frequency: 5.0 times per week    Types: Hydrocodone, Oxycodone, Benzodiazepines, Marijuana    Comment: heroin last used 08/23/20    Review of Systems Constitutional: No  fever/chills Eyes: No visual changes. ENT: No sore throat. Cardiovascular: Denies chest pain. Respiratory: Denies shortness of breath. Gastrointestinal: No abdominal pain.  No nausea, no vomiting.  No diarrhea.  No constipation. Genitourinary: Negative for dysuria. Musculoskeletal: Negative for neck pain.  Negative for back pain. Integumentary: Negative for rash. Neurological: Negative for headaches, focal weakness or numbness. Psychiatric:  Depression, suicidal ideation, substance abuse (mostly heroin, minimal alcohol, occasional benzodiazepines).  ____________________________________________   PHYSICAL EXAM:  VITAL  SIGNS: ED Triage Vitals  Enc Vitals Group     BP 10/24/20 2144 (!) 148/104     Pulse Rate 10/24/20 2144 (!) 120     Resp 10/24/20 2144 20     Temp 10/24/20 2144 98.9 F (37.2 C)     Temp Source 10/24/20 2144 Oral     SpO2 10/24/20 2144 95 %     Weight 10/24/20 2144 99.8 kg (220 lb)     Height 10/24/20 2144 1.651 m (5\' 5" )     Head Circumference --      Peak Flow --      Pain Score 10/24/20 2210 8     Pain Loc --      Pain Edu? --      Excl. in GC? --     Constitutional: Alert and oriented.  Eyes: Conjunctivae are normal.  Head: Atraumatic. Nose: No congestion/rhinnorhea. Mouth/Throat: Patient is wearing a mask. Neck: No stridor.  No meningeal signs.   Cardiovascular: Normal rate, regular rhythm. Good peripheral circulation. Respiratory: Normal respiratory effort.  No retractions. Gastrointestinal: Soft and nondistended. Musculoskeletal: No lower extremity tenderness nor edema. No gross deformities of extremities. Neurologic:  Normal speech and language. No gross focal neurologic deficits are appreciated.  Skin:  Skin is warm, dry and intact. Psychiatric: Patient is somewhat anxious with slightly pressured speech, seems worried, endorses persistent suicidal ideation with thoughts of drinking something poisonous to kill himself because he has no other options.  ____________________________________________   LABS (all labs ordered are listed, but only abnormal results are displayed)  Labs Reviewed  COMPREHENSIVE METABOLIC PANEL - Abnormal; Notable for the following components:      Result Value   Glucose, Bld 103 (*)    ALT 46 (*)    All other components within normal limits  SALICYLATE LEVEL - Abnormal; Notable for the following components:   Salicylate Lvl <7.0 (*)    All other components within normal limits  ACETAMINOPHEN LEVEL - Abnormal; Notable for the following components:   Acetaminophen (Tylenol), Serum <10 (*)    All other components within normal limits   URINE DRUG SCREEN, QUALITATIVE ([facility] ONLY) - Abnormal; Notable for the following components:   Cannabinoid 50 Ng, Ur Staves POSITIVE (*)    Benzodiazepine, Ur Scrn POSITIVE (*)    All other components within normal limits  RESP PANEL BY RT-PCR (FLU A&B, COVID) ARPGX2  ETHANOL  CBC   ____________________________________________  EKG  None - EKG not ordered by ED physician ____________________________________________  RADIOLOGY  No indication for emergent imaging at this time  ____________________________________________   PROCEDURES   Procedure(s) performed (including Critical Care):  Procedures   ____________________________________________   INITIAL IMPRESSION / MDM / ASSESSMENT AND PLAN / ED COURSE  As part of my medical decision making, I reviewed the following data within the electronic MEDICAL RECORD NUMBER Nursing notes reviewed and incorporated, Labs reviewed , Old chart reviewed, A consult was requested and obtained from this/these consultant(s) Psychiatry, Notes from prior ED visits and [place] Controlled  Substance Database   Differential diagnosis includes, but is not limited to, substance abuse, major depressive disorder, suicidal ideation, mood disorder, adjustment disorder, malingering.  Patient has substantial social issues such as homelessness and estrangement from his wife in addition to ongoing opioid abuse and questionable benzo and alcohol abuse.  I reviewed his prior notes and see notes from [facility] about 5 weeks ago which indicate some concern for malingering.  However he said that he feels much worse now that he did then and is endorsing active suicidal ideation and thoughts of trying to overdose or poison himself with anything he can take by mouth.  He wants to be here and has nowhere else to go and is here voluntarily currently.  I have no concerns about him from a medical perspective at this time.  He is slightly hypertensive and tachycardic which I believe  is due to his agitation and possibly some withdrawal symptoms.  I ordered Ativan 2 mg by mouth as well as a nicotine patch.  His lab work is generally reassuring with a normal comprehensive metabolic panel, ethanol level, CBC, acetaminophen, salicylate levels.  Urine drug screen is pending.  The patient has been placed in psychiatric observation due to the need to provide a safe environment for the patient while obtaining psychiatric consultation and evaluation, as well as ongoing medical and medication management to treat the patient's condition.  The patient has not been placed under full IVC at this time.      Clinical Course as of 10/25/20 0133  Sat Oct 25, 2020  0132 SARS Coronavirus 2 by RT PCR: NEGATIVE [CF]  0133 UDS positive for benzodiazepines and cannabinoids.  Patient evaluated by psychiatry in person and they plan for reassessment in the morning. [CF]    Clinical Course User Index [CF] Loleta Rose, MD     ____________________________________________  FINAL CLINICAL IMPRESSION(S) / ED DIAGNOSES  Final diagnoses:  Heroin abuse (HCC)  Depression, unspecified depression type  Suicidal ideation     MEDICATIONS GIVEN DURING THIS VISIT:  Medications  nicotine (NICODERM CQ - dosed in mg/24 hours) patch 14 mg (14 mg Transdermal Patch Applied 10/24/20 2329)  LORazepam (ATIVAN) tablet 2 mg (2 mg Oral Given 10/24/20 2328)     ED Discharge Orders    None      *Please note:  DEVANTA DANIEL was evaluated in Emergency Department on 10/25/2020 for the symptoms described in the history of present illness. He was evaluated in the context of the global COVID-19 pandemic, which necessitated consideration that the patient might be at risk for infection with the SARS-CoV-2 virus that causes COVID-19. Institutional protocols and algorithms that pertain to the evaluation of patients at risk for COVID-19 are in a state of rapid change based on information released by regulatory bodies  including the CDC and federal and state organizations. These policies and algorithms were followed during the patient's care in the ED.  Some ED evaluations and interventions may be delayed as a result of limited staffing during and after the pandemic.*  Note:  This document was prepared using Dragon voice recognition software and may include unintentional dictation errors.   Loleta Rose, MD 10/25/20 [phone]

## 2020-10-24 NOTE — ED Triage Notes (Signed)
Pt presents to ER via POV c/o suicidal thoughts.  Pt states he has been addicted to heroin for 2 years and last used yeste and last used earlier this morning.  Pt states "the drugs just don't help anymore and I don't feel like living."  Pt states he had plan to take sleeping pills or drink something poisonous.  Pt tearful in triage, and states he does not feel like he has anyone to help him and doesn't know what to do.  Pt A&O x4 at this time.

## 2020-10-24 NOTE — ED Notes (Signed)
Pt given warm blanket, ginger ale and meal tray  Pt reports SI without specific intention "I thought about cutting my wrist or sleeping pill ... overdose.   I don't know.  I want help with the heroin and think that goes along with the suicidal thoughts."  Pt reports last IV heroin use at 1600 today and reports drinking 0.5 pint of ETOH at that time, denies daily drinking, reports regular cigarettes use.  Pt denies HI, or hallucinations.  Pt requesting medication for anxiety.

## 2020-10-25 DIAGNOSIS — F1994 Other psychoactive substance use, unspecified with psychoactive substance-induced mood disorder: Secondary | ICD-10-CM

## 2020-10-25 DIAGNOSIS — Z765 Malingerer [conscious simulation]: Secondary | ICD-10-CM

## 2020-10-25 DIAGNOSIS — F331 Major depressive disorder, recurrent, moderate: Secondary | ICD-10-CM | POA: Diagnosis not present

## 2020-10-25 DIAGNOSIS — F1110 Opioid abuse, uncomplicated: Secondary | ICD-10-CM | POA: Diagnosis not present

## 2020-10-25 DIAGNOSIS — F1320 Sedative, hypnotic or anxiolytic dependence, uncomplicated: Secondary | ICD-10-CM | POA: Diagnosis not present

## 2020-10-25 DIAGNOSIS — F339 Major depressive disorder, recurrent, unspecified: Secondary | ICD-10-CM

## 2020-10-25 DIAGNOSIS — F1120 Opioid dependence, uncomplicated: Secondary | ICD-10-CM

## 2020-10-25 DIAGNOSIS — R45851 Suicidal ideations: Secondary | ICD-10-CM

## 2020-10-25 LAB — URINE DRUG SCREEN, QUALITATIVE (ARMC ONLY)
Amphetamines, Ur Screen: NOT DETECTED
Barbiturates, Ur Screen: NOT DETECTED
Benzodiazepine, Ur Scrn: POSITIVE — AB
Cannabinoid 50 Ng, Ur ~~LOC~~: POSITIVE — AB
Cocaine Metabolite,Ur ~~LOC~~: NOT DETECTED
MDMA (Ecstasy)Ur Screen: NOT DETECTED
Methadone Scn, Ur: NOT DETECTED
Opiate, Ur Screen: NOT DETECTED
Phencyclidine (PCP) Ur S: NOT DETECTED
Tricyclic, Ur Screen: NOT DETECTED

## 2020-10-25 LAB — RESP PANEL BY RT-PCR (FLU A&B, COVID) ARPGX2
Influenza A by PCR: NEGATIVE
Influenza B by PCR: NEGATIVE
SARS Coronavirus 2 by RT PCR: NEGATIVE

## 2020-10-25 MED ORDER — CLONIDINE HCL 0.1 MG PO TABS
0.1000 mg | ORAL_TABLET | Freq: Once | ORAL | Status: AC
  Administered 2020-10-25: 0.1 mg via ORAL
  Filled 2020-10-25: qty 1

## 2020-10-25 MED ORDER — ONDANSETRON 4 MG PO TBDP
4.0000 mg | ORAL_TABLET | Freq: Once | ORAL | Status: AC
  Administered 2020-10-25: 4 mg via ORAL
  Filled 2020-10-25 (×2): qty 1

## 2020-10-25 MED ORDER — LOPERAMIDE HCL 2 MG PO CAPS
4.0000 mg | ORAL_CAPSULE | Freq: Once | ORAL | Status: AC
  Administered 2020-10-25: 4 mg via ORAL
  Filled 2020-10-25 (×2): qty 2

## 2020-10-25 MED ORDER — BUPRENORPHINE HCL-NALOXONE HCL 8-2 MG SL SUBL
2.0000 | SUBLINGUAL_TABLET | SUBLINGUAL | Status: AC
  Administered 2020-10-25: 2 via SUBLINGUAL
  Filled 2020-10-25: qty 1

## 2020-10-25 NOTE — ED Notes (Signed)
Given meal tray.

## 2020-10-25 NOTE — ED Notes (Signed)
Hourly rounding reveals patient in room. No complaints, stable, in no acute distress. Q15 minute rounds and monitoring via Security Cameras to continue. 

## 2020-10-25 NOTE — ED Provider Notes (Signed)
Emergency Medicine Observation Re-evaluation Note  Ryan Kelley is a 35 y.o. male, seen on rounds today.  Pt initially presented to the ED for complaints of Suicidal Currently, the patient is laying in bed, complains of opiate withdrawal.  Physical Exam  BP 124/75   Pulse 71   Temp 97.9 F (36.6 C) (Oral)   Resp 18   Ht 5\' 5"  (1.651 m)   Wt 99.8 kg   SpO2 96%   BMI 36.61 kg/m  Physical Exam Constitutional: Resting comfortably. Eyes: Conjunctivae are normal. Head: Atraumatic. Nose: No congestion/rhinnorhea. Mouth/Throat: Mucous membranes are moist. Neck: Normal ROM Cardiovascular: No cyanosis noted. Respiratory: Normal respiratory effort. Gastrointestinal: Non-distended. Genitourinary: deferred Musculoskeletal: No lower extremity tenderness nor edema. Neurologic:  Normal speech and language. No gross focal neurologic deficits are appreciated. Skin:  Skin is warm, dry and intact. No rash noted.    ED Course / MDM  EKG:   I have reviewed the labs performed to date as well as medications administered while in observation.  Recent changes in the last 24 hours include none.  Plan  Current plan is for reassessment by psychiatry. We will medicate with clonidine, zofran, and loperamide of opiate withdrawal. Patient is not under full IVC at this time.   , MD 10/25/20 [phone]

## 2020-10-25 NOTE — ED Notes (Signed)
Pt. Transferred to BHU from ED to room 4 after screening for contraband. Report to include Situation, Background, Assessment and Recommendations from [facility]. Pt. Oriented to unit including Q15 minute rounds as well as the security cameras for their protection. Patient is alert and oriented, warm and dry in no acute distress. Patient denies SI, HI, and AVH. Pt. Encouraged to let me know if needs arise.

## 2020-10-25 NOTE — ED Notes (Signed)
Report to include Situation, Background, Assessment, and Recommendations received from Sarah RN. Patient alert and oriented, warm and dry, in no acute distress. Patient denies SI, HI, AVH and pain. Patient made aware of Q15 minute rounds and security cameras for their safety. Patient instructed to come to me with needs or concerns.  

## 2020-10-25 NOTE — ED Notes (Signed)
Pt's aunt called. Number written down and phone given to pt but pt does not want to call at this time.

## 2020-10-25 NOTE — ED Notes (Signed)
Pt given meal tray.

## 2020-10-25 NOTE — ED Notes (Signed)
Pt requesting medication for anxiety. Dr. Michiel Sites at bedside.

## 2020-10-25 NOTE — ED Notes (Signed)
Pt sleeping. Will obtain vitals once awake.  

## 2020-10-25 NOTE — BH Assessment (Addendum)
Comprehensive Clinical Assessment (CCA) Note  10/25/2020 Ryan Kelley [id number] Recommendations for Services/Supports/Treatments: Consulted with Rashaun D., NP, who determined pt. does not meet criteria for inpatient psychiatric treatment. Per Rashaun D., RP, pt is recommended for overnight observation and reassessment in the AM. Notified Dr. Fuller Plan and Danelle Earthly, RN of disposition recommendation.   Ryan Kelley is a 35 year old patient who presented to [facility] ED due to increased feelings of SI. Pt presented with normal psychomotor activity and a normal posture. Pt's speech was soft, but coherent. Pt's thought processes were clear and intact. Pt had good reality testing and impulse control. Pt presented with a depressed mood and a congruent affect. Pt endorsed increased depression and anxiety due to his wife's request to separate from him. It was noted that the pt. was preoccupied with being admitted for mental health treatment for his depression. Pt agreed that he needed to focus on his substance abuse treatment. Pt denied suicidal/homicidal ideations. Pt denied auditory/visual hallucinations, however she reported that she was not currently having any.  Flowsheet Row ED from 10/24/2020 in [facility] ED from 09/15/2020 in [facility] ED from 09/13/2020 in [facility] EMERGENCY DEPARTMENT  C-SSRS RISK CATEGORY Error: Q3, 4, or 5 should not be populated when Q2 is No Error: Q7 should not be populated when Q6 is No Error: Q3, 4, or 5 should not be populated when Q2 is No     The patient demonstrates the following risk factors for suicide: Chronic risk factors for suicide include: substance use disorder and demographic factors (male, >50 y/o). Acute risk factors for suicide include: family or marital conflict and loss (financial, interpersonal, professional). Protective factors for this patient include: responsibility to others  (children, family). Considering these factors, the overall suicide risk at this point appears to be low. Patient is appropriate for outpatient follow up. Chief Complaint:  Chief Complaint  Patient presents with  . Suicidal   Visit Diagnosis: Major Depressive Disorder, recurrent moderate Polysubstance Abuse    CCA Screening, Triage and Referral (STR)  Patient Reported Information How did you hear about [place]? Self  Referral name: Self  Referral phone number: [phone]   Whom do you see for routine medical problems? Primary Care  Practice/Facility Name: Internal Medicine  Practice/Facility Phone Number: [phone]  Name of Contact: No data recorded Contact Number: No data recorded Contact Fax Number: No data recorded Prescriber Name: Carylon Perches, MD  Prescriber Address (if known): No data recorded  What Is the Reason for Your Visit/Call Today? Suicidal thoughts; substance abuse  How Long Has This Been Causing You Problems? <Week  What Do You Feel Would Help You the Most Today? Treatment for Depression or other mood problem   Have You Recently Been in Any Inpatient Treatment (Hospital/Detox/Crisis Center/28-Day Program)? No  Name/Location of Program/Hospital:[facility] (Phreesia 09/15/2020)  How Long Were You There? 7 Days (Phreesia 09/15/2020)  When Were You Discharged?  ([facility] approximately one month ago)   Have You Ever Received Services From [facility] Before? Yes  Who Do You See at [facility]? N/A   Have You Recently Had Any Thoughts About Hurting Yourself? Yes  Are You Planning to Commit Suicide/Harm Yourself At This time? Yes   Have you Recently Had Thoughts About Hurting Someone [name]? No  Explanation: I Thought About Harming My Aunt For Lies She Recently Told Me (Phreesia 09/15/2020)   Have You Used Any Alcohol or Drugs in the Past 24  Hours? Yes  How Long Ago Did You Use Drugs or Alcohol? 1755  What Did You Use and How Much? Use Heroin  and ETOH yesterday   Do You Currently Have a Therapist/Psychiatrist? No  Name of Therapist/Psychiatrist: No data recorded  Have You Been Recently Discharged From Any Office Practice or Programs? No  Explanation of Discharge From Practice/Program: discharged from [facility] Today (Phreesia 09/15/2020)     CCA Screening Triage Referral Assessment Type of Contact: Face-to-Face  Is this Initial or Reassessment? Initial Assessment  Date Telepsych consult ordered in [facility]:  10/25/2020  Time Telepsych consult ordered in [facility]:  0151   Patient Reported Information Reviewed? Yes  Patient Left Without Being Seen? No data recorded Reason for Not Completing Assessment: No data recorded  Collateral Involvement: none reported   Does Patient Have a Court Appointed Legal Guardian? No data recorded Name and Contact of Legal Guardian: Self  If Minor and Not Living with Parent(s), Who has Custody? n/a  Is CPS involved or ever been involved? Never  Is APS involved or ever been involved? Never   Patient Determined To Be At Risk for Harm To Self or Others Based on Review of Patient Reported Information or Presenting Complaint? No  Method: No data recorded Availability of Means: No data recorded Intent: No data recorded Notification Required: No data recorded Additional Information for Danger to Others Potential: No data recorded Additional Comments for Danger to Others Potential: No data recorded Are There Guns or Other Weapons in Your Home? No data recorded Types of Guns/Weapons: No data recorded Are These Weapons Safely Secured?                            No data recorded Who Could Verify You Are Able To Have These Secured: No data recorded Do You Have any Outstanding Charges, Pending Court Dates, Parole/Probation? No data recorded Contacted To Inform of Risk of Harm To Self or Others: No data recorded  Location of Assessment: [facility] ED   Does Patient Present under Involuntary  Commitment? No  IVC Papers Initial File Date: No data recorded  [place] of Residence: [place]   Patient Currently Receiving the Following Services: Not Receiving Services   Determination of Need: Emergent (2 hours)   Options For Referral: Other: Comment (Per Rashaun D., pt is recommended for overnight observation and reassessment)     CCA Biopsychosocial Intake/Chief Complaint:  SI  Current Symptoms/Problems: Pt reports increased depression due to his wife's decisioin to seperate   Patient Reported Schizophrenia/Schizoaffective Diagnosis in Past: No   Strengths: No evidence of self-care impairments  Preferences: Patient is requesting mental health treatment  Abilities: UTA   Type of Services Patient Feels are Needed: Mental health treatment for his depression   Initial Clinical Notes/Concerns: No data recorded  Mental Health Symptoms Depression:  Hopelessness; Increase/decrease in appetite; Sleep (too much or little); Tearfulness   Duration of Depressive symptoms: Greater than two weeks   Mania:  N/A   Anxiety:   Difficulty concentrating; Irritability; Restlessness; Sleep; Worrying; Tension   Psychosis:  None   Duration of Psychotic symptoms: Less than six months   Trauma:  N/A   Obsessions:  N/A   Compulsions:  N/A   Inattention:  N/A   Hyperactivity/Impulsivity:  N/A   Oppositional/Defiant Behaviors:  N/A   Emotional Irregularity:  Recurrent suicidal behaviors/gestures/threats   Other Mood/Personality Symptoms:  No data recorded   Mental Status Exam Appearance and  self-care  Stature:  Average   Weight:  Average weight   Clothing:  Casual   Grooming:  Normal   Cosmetic use:  None   Posture/gait:  Normal   Motor activity:  Not Remarkable   Sensorium  Attention:  Normal   Concentration:  Normal   Orientation:  Place; Person; Situation; Time; Object   Recall/memory:  Normal   Affect and Mood  Affect:  Anxious   Mood:   Anxious   Relating  Eye contact:  Normal   Facial expression:  Depressed   Attitude toward examiner:  Cooperative   Thought and Language  Speech flow: Soft   Thought content:  Appropriate to Mood and Circumstances   Preoccupation:  None   Hallucinations:  None   Organization:  No data recorded  [employer] of Knowledge:  Fair   Intelligence:  Average   Abstraction:  Normal   Judgement:  Fair   Dance movement psychotherapist:  Adequate   Insight:  Fair   Decision Making:  Normal   Social Functioning  Social Maturity:  Irresponsible   Social Judgement:  "Street Smart"   Stress  Stressors:  Surveyor, quantity; Relationship; Family conflict; Housing; Grief/losses   Coping Ability:  Overwhelmed   Skill Deficits:  Activities of daily living; Interpersonal; Self-control   Supports:  Support needed     Religion: Religion/Spirituality Are You A Religious Person?:  (n/a) How Might This Affect Treatment?: n/a  Leisure/Recreation: Leisure / Recreation Do You Have Hobbies?: No  Exercise/Diet: Exercise/Diet Do You Exercise?: No Have You Gained or Lost A Significant Amount of Weight in the Past Six Months?: No Do You Follow a Special Diet?: No Do You Have Any Trouble Sleeping?: Yes   CCA Employment/Education Employment/Work Situation:    Education:     CCA Family/Childhood History Family and Relationship History: Family history Marital status: Separated Separated, when?: 3/22 What types of issues is patient dealing with in the relationship?: stressed due to his drug use Additional relationship information: UTA Are you sexually active?: Yes What is your sexual orientation?: Heterosexual Has your sexual activity been affected by drugs, alcohol, medication, or emotional stress?: UTA Does patient have children?: No  Childhood History:  Childhood History By whom was/is the patient raised?: Mother Additional childhood history information: Pt states that he  had a good childhood.  Description of patient's relationship with caregiver when they were a child: Pt reports getting along well with parents growing up. Parents were divorced Patient's description of current relationship with people who raised him/her: Patient states that he is still close to his parents How were you disciplined when you got in trouble as a child/adolescent?: Patient denies any history of abuse and states that he was disciplined appropriately Does patient have siblings?: Yes Number of Siblings: 1 Description of patient's current relationship with siblings: Brother is deceased Did patient suffer any verbal/emotional/physical/sexual abuse as a child?: No Did patient suffer from severe childhood neglect?: No Has patient ever been sexually abused/assaulted/raped as an adolescent or adult?: No Was the patient ever a victim of a crime or a disaster?: No Witnessed domestic violence?: No Has patient been affected by domestic violence as an adult?: No  Child/Adolescent Assessment:     CCA Substance Use Alcohol/Drug Use:                           ASAM's:  Six Dimensions of Multidimensional Assessment  Dimension 1:  Acute Intoxication and/or Withdrawal  Potential:      Dimension 2:  Biomedical Conditions and Complications:      Dimension 3:  Emotional, Behavioral, or Cognitive Conditions and Complications:     Dimension 4:  Readiness to Change:     Dimension 5:  Relapse, Continued use, or Continued Problem Potential:     Dimension 6:  Recovery/Living Environment:     ASAM Severity Score:    ASAM Recommended Level of Treatment:     Substance use Disorder (SUD)    Recommendations for Services/Supports/Treatments:    DSM5 Diagnoses: Patient Active Problem List   Diagnosis Date Noted  . Malingering 08/25/2020  . Polysubstance abuse (HCC) 08/21/2020  . Severe benzodiazepine use disorder (HCC) 05/27/2020  . 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 Opioid  withdrawal (HCC) 07/08/2016  . Substance induced mood disorder (HCC) 12/05/2013  . Opioid use disorder, severe, dependence (HCC) 10/18/2012  . Opioid use with withdrawal (HCC) 10/18/2012   Jasmine R OneidaFaulcon, LCAS

## 2020-10-25 NOTE — ED Notes (Signed)
Pt given meal tray. States trouble with heroin withdrawals. Denies further needs.

## 2020-10-25 NOTE — BH Assessment (Signed)
This writer attempted to contact pt's legal guardian [place] (Mother) [phone] tp notify her about the pt's arrival; however, there was no answer. This Clinical research associate left a HIPPA compliant message, requesting a call back.

## 2020-10-25 NOTE — Consult Note (Signed)
[facility] Face-to-Face Psychiatry Consult   Reason for Consult:  Psychiatric evaluation Referring Physician:  Dr. York CeriseForbach Patient Identification: Ryan Kelley MRN:  [id number] Principal Diagnosis: <principal problem not specified> Diagnosis:  Active Problems:   Opioid use disorder, severe, dependence (HCC)   Substance induced mood disorder (HCC)   Severe benzodiazepine use disorder (HCC)   Malingering   MDD (major depressive disorder), recurrent episode (HCC)   Total Time spent with patient: 1 hour  Subjective:   Ryan Kelley is a 35 y.o. male patient " I need help"  HPI: Ryan Kelley, 35 y.o., male patient [facility] voluntarily for depression and substance abuse disorder.  Patient seen TTS and this provider; chart reviewed and consulted with Dr. Lucianne MussKumar on 10/25/20.  On evaluation Ryan Kelley reports per TTS, Ryan Kelley is a 35 year old patient who [facility] ED due to increased feelings of SI. Pt presented with normal psychomotor activity and a normal posture. Pt's speech was soft, but coherent. Pt's thought processes were clear and intact. Pt had good reality testing and impulse control. Pt presented with a depressed mood and a congruent affect. Pt endorsed increased depression and anxiety. It was noted that the pt. was preoccupied with being admitted for mental health treatment for his depression. Pt agreed that he needed to focus on his substance abuse treatment. Pt denied suicidal/homicidal ideations. Pt denied auditory/visual hallucinations, however she reported that she was not currently having any.    Per EDP HPI:  Ryan Kelley is a 35 y.o. male with medical history as listed below which notably includes depression, PTSD, prior suicide attempt(s), substance abuse, and malingering as per notes from [facility] psychiatry.  He presents tonight voluntarily reporting persistent drug abuse (heroin) which is led him to be severely depressed and wants to kill himself.  He said he thinks  his wife is getting ready to leave and which makes it worse.  The symptoms been gradual in onset, severe, nothing in particular makes it better or worse.  He said he drank some alcohol earlier today but it was 8 or 9 hours ago and he does not regularly drink alcohol.  Occasionally uses some benzos but mostly he abuses heroin which he last used this morning.  He denies any medical complaints or concerns.  He said he feels very agitated and anxious right now.  He uses at least a pack of cigarettes a day as well.  He denies chest pain, shortness of breath, nausea, vomiting, abdominal pain.   During evaluation Ryan Kelley is laying in bed; he is alert/oriented x 4; calm/cooperative/depressed; and mood congruent with affect.  Patient is speaking in a clear tone at moderate volume, and normal pace; with fair eye contact.  His thought process is coherent and relevant; There is no indication that he is currently responding to internal/external stimuli or experiencing delusional thought content.  Patient endorses passive suicidal/self-harm and denies homicidal ideation, psychosis, and paranoia.  Patient has remained calm throughout assessment and has answered questions appropriately.   Recommendations: Reassessment in the am as patient has a history of malingering and non compliance with follow-up for detox.  Past Psychiatric History: Poly substance abuse   Risk to Self:   Risk to Others:   Prior Inpatient Therapy:   Prior Outpatient Therapy:    Past Medical History:  Past Medical History:  Diagnosis Date  . Anxiety   . Attempted suicide (HCC)   . Carpal tunnel syndrome   .  Chronic back pain   . Chronic knee pain   . Depression   . Insomnia   . PTSD (post-traumatic stress disorder)   . Substance abuse (HCC)    History reviewed. No pertinent surgical history. Family History:  Family History  Problem Relation Age of Onset  . Diabetes Mother   . Cancer Mother   . Hypertension Mother   .  Hypertension Father    Family Psychiatric  History: unknown Social History:  Social History   Substance and Sexual Activity  Alcohol Use Yes   Comment: occasionally      Social History   Substance and Sexual Activity  Drug Use Yes  . Frequency: 5.0 times per week  . Types: Hydrocodone, Oxycodone, Benzodiazepines, Marijuana   Comment: heroin last used 08/23/20    Social History   Socioeconomic History  . Marital status: Single    Spouse name: Not on file  . Number of children: Not on file  . Years of education: Not on file  . Highest education level: Not on file  Occupational History  . Not on file  Tobacco Use  . Smoking status: Current Every Day Smoker    Packs/day: 1.00    Years: 7.00    Pack years: 7.00    Types: Cigarettes  . Smokeless tobacco: Never Used  Substance and Sexual Activity  . Alcohol use: Yes    Comment: occasionally   . Drug use: Yes    Frequency: 5.0 times per week    Types: Hydrocodone, Oxycodone, Benzodiazepines, Marijuana    Comment: heroin last used 08/23/20  . Sexual activity: Not on file  Other Topics Concern  . Not on file  Social History Narrative   ** Merged History Encounter **       Social Determinants of Health   Financial Resource Strain: Not on file  Food Insecurity: Not on file  Transportation Needs: Not on file  Physical Activity: Not on file  Stress: Not on file  Social Connections: Not on file   Additional Social History:    Allergies:   Allergies  Allergen Reactions  . Poison Ivy Extract [Poison Ivy Extract]     Labs:  Results for orders placed or performed during the hospital encounter of 10/24/20 (from the past 48 hour(s))  Comprehensive metabolic panel     Status: Abnormal   Collection Time: 10/24/20  9:48 PM  Result Value Ref Range   Sodium 135 135 - 145 mmol/L   Potassium 3.6 3.5 - 5.1 mmol/L   Chloride 101 98 - 111 mmol/L   CO2 23 22 - 32 mmol/L   Glucose, Bld 103 (H) 70 - 99 mg/dL    Comment:  Glucose reference range applies only to samples taken after fasting for at least 8 hours.   BUN 11 6 - 20 mg/dL   Creatinine, Ser 1.61 0.61 - 1.24 mg/dL   Calcium 9.4 8.9 - 09.6 mg/dL   Total Protein 7.8 6.5 - 8.1 g/dL   Albumin 4.7 3.5 - 5.0 g/dL   AST 39 15 - 41 U/L   ALT 46 (H) 0 - 44 U/L   Alkaline Phosphatase 70 38 - 126 U/L   Total Bilirubin 0.6 0.3 - 1.2 mg/dL   GFR, Estimated >04 >54 mL/min    Comment: (NOTE) Calculated using the CKD-EPI Creatinine Equation (2021)    Anion gap 11 5 - 15    Comment: Performed at [facility], [address] [place], [place] [address]  Ethanol  Status: None   Collection Time: 10/24/20  9:48 PM  Result Value Ref Range   Alcohol, Ethyl (B) <10 <10 mg/dL    Comment: (NOTE) Lowest detectable limit for serum alcohol is 10 mg/dL.  For medical purposes only. Performed at [facility], [address] [place], [place] [address]   Salicylate level     Status: Abnormal   Collection Time: 10/24/20  9:48 PM  Result Value Ref Range   Salicylate Lvl <7.0 (L) 7.0 - 30.0 mg/dL    Comment: Performed at [facility], [address] [place], [place] [address]  Acetaminophen level     Status: Abnormal   Collection Time: 10/24/20  9:48 PM  Result Value Ref Range   Acetaminophen (Tylenol), Serum <10 (L) 10 - 30 ug/mL    Comment: (NOTE) Therapeutic concentrations vary significantly. A range of 10-30 ug/mL  may be an effective concentration for many patients. However, some  are best treated at concentrations outside of this range. Acetaminophen concentrations >150 ug/mL at 4 hours after ingestion  and >50 ug/mL at 12 hours after ingestion are often associated with  toxic reactions.  Performed at [facility], [address] [place], [place] [address]   cbc     Status: None   Collection Time: 10/24/20  9:48 PM  Result Value Ref Range   WBC 10.0 4.0 - 10.5 K/uL   RBC 5.23 4.22 - 5.81 MIL/uL   Hemoglobin  16.0 13.0 - 17.0 g/dL   HCT 91.4 78.2 - 95.6 %   MCV 86.8 80.0 - 100.0 fL   MCH 30.6 26.0 - 34.0 pg   MCHC 35.2 30.0 - 36.0 g/dL   RDW 21.3 08.6 - 57.8 %   Platelets 298 150 - 400 K/uL   nRBC 0.0 0.0 - 0.2 %    Comment: Performed at [facility], [address] [place], [place] [address]  Urine Drug Screen, Qualitative     Status: Abnormal   Collection Time: 10/24/20 11:06 PM  Result Value Ref Range   Tricyclic, Ur Screen NONE DETECTED NONE DETECTED   Amphetamines, Ur Screen NONE DETECTED NONE DETECTED   MDMA (Ecstasy)Ur Screen NONE DETECTED NONE DETECTED   Cocaine Metabolite,Ur [place] NONE DETECTED NONE DETECTED   Opiate, Ur Screen NONE DETECTED NONE DETECTED   Phencyclidine (PCP) Ur S NONE DETECTED NONE DETECTED   Cannabinoid 50 Ng, Ur [place] POSITIVE (A) NONE DETECTED   Barbiturates, Ur Screen NONE DETECTED NONE DETECTED   Benzodiazepine, Ur Scrn POSITIVE (A) NONE DETECTED   Methadone Scn, Ur NONE DETECTED NONE DETECTED    Comment: (NOTE) Tricyclics + metabolites, urine    Cutoff 1000 ng/mL Amphetamines + metabolites, urine  Cutoff 1000 ng/mL MDMA (Ecstasy), urine              Cutoff 500 ng/mL Cocaine Metabolite, urine          Cutoff 300 ng/mL Opiate + metabolites, urine        Cutoff 300 ng/mL Phencyclidine (PCP), urine         Cutoff 25 ng/mL Cannabinoid, urine                 Cutoff 50 ng/mL Barbiturates + metabolites, urine  Cutoff 200 ng/mL Benzodiazepine, urine              Cutoff 200 ng/mL Methadone, urine                   Cutoff 300 ng/mL  The urine drug screen provides only a preliminary, unconfirmed analytical test result and should not be used for non-medical purposes. Clinical consideration and professional judgment should be applied to any positive drug screen result due to possible interfering substances. A more specific alternate chemical method must be used in order to obtain a confirmed analytical result. Gas chromatography / mass spectrometry (GC/MS)  is the preferred confirm atory method. Performed at [facility], [address] [place], [place] [address]   Resp Panel by RT-PCR (Flu A&B, Covid) Nasopharyngeal Swab     Status: None   Collection Time: 10/24/20 11:25 PM   Specimen: Nasopharyngeal Swab; Nasopharyngeal(NP) swabs in vial transport medium  Result Value Ref Range   SARS Coronavirus 2 by RT PCR NEGATIVE NEGATIVE    Comment: (NOTE) SARS-CoV-2 target nucleic acids are NOT DETECTED.  The SARS-CoV-2 RNA is generally detectable in upper respiratory specimens during the acute phase of infection. The lowest concentration of SARS-CoV-2 viral copies this assay can detect is 138 copies/mL. A negative result does not preclude SARS-Cov-2 infection and should not be used as the sole basis for treatment or other patient management decisions. A negative result may occur with  improper specimen collection/handling, submission of specimen other than nasopharyngeal swab, presence of viral mutation(s) within the areas targeted by this assay, and inadequate number of viral copies(<138 copies/mL). A negative result must be combined with clinical observations, patient history, and epidemiological information. The expected result is Negative.  Fact Sheet for Patients:  BloggerCourse.com  Fact Sheet for Healthcare Providers:  SeriousBroker.it  This test is no t yet approved or cleared by the [place] FDA and  has been authorized for detection and/or diagnosis of SARS-CoV-2 by FDA under an Emergency Use Authorization (EUA). This EUA will remain  in effect (meaning this test can be used) for the duration of the COVID-19 declaration under Section 564(b)(1) of the Act, 21 U.S.C.section 360bbb-3(b)(1), unless the authorization is terminated  or revoked sooner.       Influenza A by PCR NEGATIVE NEGATIVE   Influenza B by PCR NEGATIVE NEGATIVE    Comment: (NOTE) The Xpert  Xpress SARS-CoV-2/FLU/RSV plus assay is intended as an aid in the diagnosis of influenza from Nasopharyngeal swab specimens and should not be used as a sole basis for treatment. Nasal washings and aspirates are unacceptable for Xpert Xpress SARS-CoV-2/FLU/RSV testing.  Fact Sheet for Patients: BloggerCourse.com  Fact Sheet for Healthcare Providers: SeriousBroker.it  This test is not yet approved or cleared by the [place] FDA and has been authorized for detection and/or diagnosis of SARS-CoV-2 by FDA under an Emergency Use Authorization (EUA). This EUA will remain in effect (meaning this test can be used) for the duration of the COVID-19 declaration under Section 564(b)(1) of the Act, 21 U.S.C. section 360bbb-3(b)(1), unless the authorization is terminated or revoked.  Performed at [facility], [address] [place], [place] [address]     Current Facility-Administered Medications  Medication Dose Route Frequency Provider Last Rate Last Admin  . nicotine (NICODERM CQ - dosed in mg/24 hours) patch 14 mg  14 mg Transdermal Once Loleta Rose, MD   14 mg at 10/24/20 2329   Current Outpatient Medications  Medication Sig Dispense Refill  . FLUoxetine (PROZAC) 20 MG capsule Take 1 capsule (20 mg total) by mouth daily. 30 capsule 0  . traZODone (DESYREL) 50 MG tablet Take 50 mg by mouth at bedtime.      Musculoskeletal: Strength & Muscle Tone: within  normal limits Gait & Station: normal Patient leans: N/A            Psychiatric Specialty Exam:  Presentation  General Appearance: Appropriate for Environment; Disheveled  Eye Contact:Fair  Speech:Clear and Coherent  Speech Volume:Normal  Handedness:Right   Mood and Affect  Mood:Depressed; Hopeless  Affect:Congruent; Tearful   Thought Process  Thought Processes:Coherent  Descriptions of Associations:Intact  Orientation:Full (Time, Place  and Person)  Thought Content:WDL  History of Schizophrenia/Schizoaffective disorder:No  Duration of Psychotic Symptoms:Less than six months  Hallucinations:Hallucinations: None  Ideas of Reference:None  Suicidal Thoughts:Suicidal Thoughts: Yes, Passive SI Passive Intent and/or Plan: Without Intent; Without Plan; Without Means to Carry Out  Homicidal Thoughts:Homicidal Thoughts: No   Sensorium  Memory:Immediate Fair  Judgment:Fair  Insight:Fair   Executive Functions  Concentration:Fair  Attention Span:Fair  Recall:Fair  Fund of Knowledge:Fair  Language:Fair   Psychomotor Activity  Psychomotor Activity:Psychomotor Activity: Normal   Assets  Assets:Communication Skills; Desire for Improvement   Sleep  Sleep:Sleep: Fair   Physical Exam: Physical Exam Vitals and nursing note reviewed.  Constitutional:      Appearance: He is ill-appearing.  HENT:     Head: Normocephalic and atraumatic.     Nose: Nose normal.     Mouth/Throat:     Mouth: Mucous membranes are moist.  Eyes:     Pupils: Pupils are equal, round, and reactive to light.  Pulmonary:     Effort: Pulmonary effort is normal.  Musculoskeletal:        General: Normal range of motion.     Cervical back: Normal range of motion.  Skin:    General: Skin is warm and dry.  Neurological:     Mental Status: He is alert and oriented to person, place, and time.  Psychiatric:        Attention and Perception: Attention and perception normal.        Mood and Affect: Mood is depressed. Affect is tearful.        Speech: Speech normal.        Behavior: Behavior normal. Behavior is cooperative.        Thought Content: Thought content normal.        Cognition and Memory: Cognition and memory normal.        Judgment: Judgment normal.    Review of Systems  Psychiatric/Behavioral: Positive for depression and substance abuse. The patient is nervous/anxious.   All other systems reviewed and are  negative.  Blood pressure (!) 148/104, pulse (!) 120, temperature 98.9 F (37.2 C), temperature source Oral, resp. rate 20, height 5\' 5"  (1.651 m), weight 99.8 kg, SpO2 95 %. Body mass index is 36.61 kg/m.    Disposition: Reassess in the am  , NP 10/25/2020 4:33 AM

## 2020-10-26 MED ORDER — DIAZEPAM 5 MG PO TABS
5.0000 mg | ORAL_TABLET | Freq: Once | ORAL | Status: AC
  Administered 2020-10-26: 5 mg via ORAL
  Filled 2020-10-26: qty 1

## 2020-10-26 MED ORDER — BUPRENORPHINE HCL-NALOXONE HCL 8-2 MG SL SUBL
2.0000 | SUBLINGUAL_TABLET | SUBLINGUAL | Status: AC
Start: 2020-10-26 — End: 2020-10-26
  Administered 2020-10-26: 2 via SUBLINGUAL
  Filled 2020-10-26: qty 2

## 2020-10-26 NOTE — ED Notes (Signed)
Hourly rounding reveals patient in room. No complaints, stable, in no acute distress. Q15 minute rounds and monitoring via Security Cameras to continue. 

## 2020-10-26 NOTE — ED Notes (Signed)
Patient used phone to call mother

## 2020-10-26 NOTE — ED Provider Notes (Signed)
Emergency Medicine Observation Re-evaluation Note  Ryan Kelley is a 35 y.o. male, seen on rounds today.  Pt initially presented to the ED for complaints of Suicidal Currently, the patient is resting comfortably.  Physical Exam  BP 125/69   Pulse 63   Temp 98.4 F (36.9 C) (Oral)   Resp 18   Ht 5\' 5"  (1.651 m)   Wt 99.8 kg   SpO2 96%   BMI 36.61 kg/m  Physical Exam General: Resting, NAD  Cardiac: well perfused Lungs: resp even and unlabored Psych: calm   ED Course / MDM  EKG:   I have reviewed the labs performed to date as well as medications administered while in observation.  Recent changes in the last 24 hours include none.  Plan  Current plan is for tts. Patient is not under full IVC at this time.   , MD 10/26/20 [phone]

## 2020-10-26 NOTE — ED Notes (Signed)
Report to include Situation, Background, Assessment, and Recommendations received from Amber RN. Patient alert and oriented, warm and dry, in no acute distress. Patient denies SI, HI, AVH and pain. Patient made aware of Q15 minute rounds and security cameras for their safety. Patient instructed to come to me with needs or concerns.  

## 2020-10-26 NOTE — ED Provider Notes (Signed)
TTS to refer to substance abuse facilities.  He is here voluntarily.     Lorey Pallett, Layla Maw, DO 10/26/20 [phone]

## 2020-10-26 NOTE — ED Notes (Signed)
Pt was given dinner tray and a drink

## 2020-10-26 NOTE — BH Assessment (Signed)
Referral information for Psychiatric Hospitalization faxed to;   [facility] [phone]),   . [facility] [phone])  . [facility] [phone])  . [facility] [phone])  . [facility] [phone])   [facility] [phone])    [facility] [phone])

## 2020-10-26 NOTE — ED Notes (Signed)
Pt sleeping.  Breakfast tray placed in his room

## 2020-10-26 NOTE — ED Notes (Signed)
Pt out of shower and given lunch tray. 

## 2020-10-26 NOTE — ED Notes (Signed)
VS not taken, patient asleep 

## 2020-10-26 NOTE — ED Notes (Signed)
Patients mother [name] called. [name] was informed of phone hours during the day. Family member understanding and will call back

## 2020-10-26 NOTE — ED Notes (Signed)
Patient requesting anxiety medication. MD Roxan Hockey made aware. Verbal order given. See [facility]

## 2020-10-26 NOTE — ED Notes (Signed)
Vol/Pending placement  

## 2020-10-26 NOTE — ED Notes (Signed)
Pt in shower, given change of scrubs.

## 2020-10-27 DIAGNOSIS — F1120 Opioid dependence, uncomplicated: Secondary | ICD-10-CM | POA: Diagnosis not present

## 2020-10-27 NOTE — Consult Note (Signed)
[facility] Face-to-Face Psychiatry Consult   Reason for Consult: Reevaluation for 35 year old man in the emergency room with history of substance abuse and mood symptoms Referring Physician: Kinner Patient Identification: Ryan Kelley MRN:  [id number] Principal Diagnosis: Substance induced mood disorder (HCC) Diagnosis:  Principal Problem:   Substance induced mood disorder (HCC) Active Problems:   Opioid use disorder, severe, dependence (HCC)   Heroin abuse (HCC)   Severe benzodiazepine use disorder (HCC)   Malingering   MDD (major depressive disorder), recurrent episode (HCC)   Total Time spent with patient: 30 minutes  Subjective:   Ryan Kelley is a 35 y.o. male patient admitted with "I am feeling much better".  HPI: Patient seen chart reviewed.  35 year old man known to our emergency room who has a history of polysubstance abuse and mood symptoms and at times has been identified as overusing or misusing the hospital system for secondary gain.  He has been here over the weekend having endorsed mood symptoms vague passive suicidal thoughts and substance abuse.  On reevaluation today the patient says he is feeling much better.  He spoke with his family and they have identified a safe place for him to stay with therapy in the near future.  He currently denies suicidal thoughts.  Says his mood is feeling much better.  He is not agitated aggressive disorganized or psychotic.  Shows reasonably good insight.  Past Psychiatric History: Long history of substance abuse multiple relapses mood symptoms often congruent with the time in which she is using  Risk to Self:   Risk to Others:   Prior Inpatient Therapy:   Prior Outpatient Therapy:    Past Medical History:  Past Medical History:  Diagnosis Date  . Anxiety   . Attempted suicide (HCC)   . Carpal tunnel syndrome   . Chronic back pain   . Chronic knee pain   . Depression   . Insomnia   . PTSD (post-traumatic stress disorder)   .  Substance abuse (HCC)    History reviewed. No pertinent surgical history. Family History:  Family History  Problem Relation Age of Onset  . Diabetes Mother   . Cancer Mother   . Hypertension Mother   . Hypertension Father    Family Psychiatric  History: See previous Social History:  Social History   Substance and Sexual Activity  Alcohol Use Yes   Comment: occasionally      Social History   Substance and Sexual Activity  Drug Use Yes  . Frequency: 5.0 times per week  . Types: Hydrocodone, Oxycodone, Benzodiazepines, Marijuana   Comment: heroin last used 08/23/20    Social History   Socioeconomic History  . Marital status: Single    Spouse name: Not on file  . Number of children: Not on file  . Years of education: Not on file  . Highest education level: Not on file  Occupational History  . Not on file  Tobacco Use  . Smoking status: Current Every Day Smoker    Packs/day: 1.00    Years: 7.00    Pack years: 7.00    Types: Cigarettes  . Smokeless tobacco: Never Used  Substance and Sexual Activity  . Alcohol use: Yes    Comment: occasionally   . Drug use: Yes    Frequency: 5.0 times per week    Types: Hydrocodone, Oxycodone, Benzodiazepines, Marijuana    Comment: heroin last used 08/23/20  . Sexual activity: Not on file  Other Topics Concern  . Not on  file  Social History Narrative   ** Merged History Encounter **       Social Determinants of Health   Financial Resource Strain: Not on file  Food Insecurity: Not on file  Transportation Needs: Not on file  Physical Activity: Not on file  Stress: Not on file  Social Connections: Not on file   Additional Social History:    Allergies:   Allergies  Allergen Reactions  . Poison Ivy Extract [Poison Ivy Extract]     Labs: No results found for this or any previous visit (from the past 48 hour(s)).  No current facility-administered medications for this encounter.   Current Outpatient Medications   Medication Sig Dispense Refill  . FLUoxetine (PROZAC) 20 MG capsule Take 1 capsule (20 mg total) by mouth daily. 30 capsule 0  . traZODone (DESYREL) 50 MG tablet Take 50 mg by mouth at bedtime.      Musculoskeletal: Strength & Muscle Tone: within normal limits Gait & Station: normal Patient leans: N/A            Psychiatric Specialty Exam:  Presentation  General Appearance: Appropriate for Environment; Disheveled  Eye Contact:Fair  Speech:Clear and Coherent  Speech Volume:Normal  Handedness:Right   Mood and Affect  Mood:Depressed; Hopeless  Affect:Congruent; Tearful   Thought Process  Thought Processes:Coherent  Descriptions of Associations:Intact  Orientation:Full (Time, Place and Person)  Thought Content:WDL  History of Schizophrenia/Schizoaffective disorder:No  Duration of Psychotic Symptoms:Less than six months  Hallucinations:No data recorded Ideas of Reference:None  Suicidal Thoughts:No data recorded Homicidal Thoughts:No data recorded  Sensorium  Memory:Immediate Fair  Judgment:Fair  Insight:Fair   Executive Functions  Concentration:Fair  Attention Span:Fair  Recall:Fair  Fund of Knowledge:Fair  Language:Fair   Psychomotor Activity  Psychomotor Activity:No data recorded  Assets  Assets:Communication Skills; Desire for Improvement   Sleep  Sleep:No data recorded  Physical Exam: Physical Exam Vitals and nursing note reviewed.  Constitutional:      Appearance: Normal appearance.  HENT:     Head: Normocephalic and atraumatic.     Mouth/Throat:     Pharynx: Oropharynx is clear.  Eyes:     Pupils: Pupils are equal, round, and reactive to light.  Cardiovascular:     Rate and Rhythm: Normal rate and regular rhythm.  Pulmonary:     Effort: Pulmonary effort is normal.     Breath sounds: Normal breath sounds.  Abdominal:     General: Abdomen is flat.     Palpations: Abdomen is soft.  Musculoskeletal:         General: Normal range of motion.  Skin:    General: Skin is warm and dry.  Neurological:     General: No focal deficit present.     Mental Status: He is alert. Mental status is at baseline.  Psychiatric:        Mood and Affect: Mood normal.        Thought Content: Thought content normal.    Review of Systems  Constitutional: Negative.   HENT: Negative.   Eyes: Negative.   Respiratory: Negative.   Cardiovascular: Negative.   Gastrointestinal: Negative.   Musculoskeletal: Negative.   Skin: Negative.   Neurological: Negative.   Psychiatric/Behavioral: Negative.    Blood pressure 130/71, pulse 65, temperature 98.3 F (36.8 C), temperature source Oral, resp. rate 18, height 5\' 5"  (1.651 m), weight 99.8 kg, SpO2 97 %. Body mass index is 36.61 kg/m.  Treatment Plan Summary: Plan Patient appears to be back to his baseline.  Not complaining of any withdrawal symptoms.  Denies suicidal ideation.  No sign of dangerousness to self and no sign of psychosis.  He is not under IVC.  He is familiar with recommended and available substance abuse treatment options in the community.  Reviewed case with emergency room doctor.  Patient can be discharged to the custody of his family and is encouraged as s to engage in active substance abuse treatment.  Disposition: No evidence of imminent risk to self or others at present.   Patient does not meet criteria for psychiatric inpatient admission. Supportive therapy provided about ongoing stressors. Discussed crisis plan, support from social network, calling 911, coming to the Emergency Department, and calling Suicide Hotline.  Mordecai Rasmussen, MD 10/27/2020 11:36 AM

## 2020-10-27 NOTE — ED Notes (Signed)
Hourly rounding reveals patient in room. No complaints, stable, in no acute distress. Q15 minute rounds and monitoring via Security Cameras to continue. 

## 2020-10-27 NOTE — ED Provider Notes (Signed)
Patient cleared for discharge by Dr. Toni Amend of psychiatry.   Jene Every, MD 10/27/20 1135

## 2020-10-27 NOTE — ED Notes (Signed)
Pt discharged home.  VS stable. All belongings returned to patient.   Discharge instructions reviewed with patient. Pt denies SI.

## 2020-10-27 NOTE — ED Notes (Signed)
VS not taken, patient asleep 

## 2020-10-27 NOTE — ED Notes (Signed)
Pt wanting to be discharged. Pt states his family has found somewhere they will take him to.  Dr. Toni Amend made aware.

## 2020-10-27 NOTE — ED Notes (Signed)
Meal tray given 

## 2024-06-28 ENCOUNTER — Emergency Department (HOSPITAL_COMMUNITY): Payer: Self-pay

## 2024-06-28 ENCOUNTER — Other Ambulatory Visit: Payer: Self-pay

## 2024-06-28 ENCOUNTER — Emergency Department (HOSPITAL_COMMUNITY)
Admission: EM | Admit: 2024-06-28 | Discharge: 2024-06-28 | Disposition: A | Discharge status: Home / Self Care | Payer: Self-pay | Attending: Emergency Medicine | Admitting: Emergency Medicine

## 2024-06-28 DIAGNOSIS — Y9301 Activity, walking, marching and hiking: Secondary | ICD-10-CM | POA: Insufficient documentation

## 2024-06-28 DIAGNOSIS — I10 Essential (primary) hypertension: Secondary | ICD-10-CM | POA: Insufficient documentation

## 2024-06-28 DIAGNOSIS — Z79899 Other long term (current) drug therapy: Secondary | ICD-10-CM | POA: Insufficient documentation

## 2024-06-28 DIAGNOSIS — S8252XA Displaced fracture of medial malleolus of left tibia, initial encounter for closed fracture: Secondary | ICD-10-CM | POA: Insufficient documentation

## 2024-06-28 DIAGNOSIS — W1781XA Fall down embankment (hill), initial encounter: Secondary | ICD-10-CM | POA: Insufficient documentation

## 2024-06-28 MED ORDER — LORAZEPAM 1 MG PO TABS
1.0000 mg | ORAL_TABLET | Freq: Once | ORAL | Status: AC
  Administered 2024-06-28: 1 mg via ORAL
  Filled 2024-06-28: qty 1

## 2024-06-28 MED ORDER — IBUPROFEN 400 MG PO TABS
600.0000 mg | ORAL_TABLET | Freq: Once | ORAL | Status: AC
  Administered 2024-06-28: 600 mg via ORAL
  Filled 2024-06-28: qty 2

## 2024-06-28 MED ORDER — IBUPROFEN 600 MG PO TABS: 600.0000 mg | ORAL_TABLET | Freq: Four times a day (QID) | ORAL | 0 refills | Status: AC | PRN

## 2024-06-28 MED ORDER — ACETAMINOPHEN 325 MG PO TABS
650.0000 mg | ORAL_TABLET | Freq: Once | ORAL | Status: AC
  Administered 2024-06-28: 650 mg via ORAL
  Filled 2024-06-28: qty 2

## 2024-06-28 NOTE — ED Provider Notes (Signed)
 [place] EMERGENCY DEPARTMENT AT [facility] Provider Note   CSN: [id number] Arrival date & time: 06/28/24  1659     Patient presents with: Ankle Pain   Ryan Kelley is a 38 y.o. male.  He has a history of heroin abuse and is on Suboxone  for medication assisted treatment.  Presents the ER today with left ankle pain.  He states he tripped going down a hill and twisted the ankle on Monday.  He went to urgent care that day and he did not think it was broken and he states that the provider there also thought it was a sprain states he was able to bear weight and walk on this.  He is having continued pain, though with continued bearing weight on the ankle so he wanted to come to the ER for x-rays to ensure it was not broken.  Denies numbness or tingling.  Denies leg or knee pain, he does note some bruising, he denies any other injuries.    Ankle Pain      Prior to Admission medications   Medication Sig Start Date End Date Taking? Authorizing Provider  Buprenorphine  HCl-Naloxone  HCl 8-2 MG FILM Place 1 Film under the tongue 2 (two) times daily. 06/16/24   [provider]  busPIRone (BUSPAR) 10 MG tablet Take 10 mg by mouth 2 (two) times daily. 06/11/24   [provider]  busPIRone (BUSPAR) 15 MG tablet Take 15 mg by mouth 2 (two) times daily. 06/14/24   [provider]  FLUoxetine  (PROZAC ) 20 MG capsule Take 1 capsule (20 mg total) by mouth daily. 08/22/20 09/21/20  Hansel Comer RAMAN, MD  hydrOXYzine  (ATARAX ) 25 MG tablet Take 25 mg by mouth 2 (two) times daily as needed. 02/06/24   [provider]  sertraline (ZOLOFT) 100 MG tablet Take 100 mg by mouth daily. 06/14/24   [provider]  traZODone  (DESYREL ) 50 MG tablet Take 50 mg by mouth at bedtime.    [provider]  SUMAtriptan  (IMITREX ) 50 MG tablet May repeat in 2 hours if headache persists or recurs.  Do not exceed more than 4 tablets in 24 hours Patient not taking:  Reported on 11/25/2015 03/13/15 12/26/15  Idol, Julie, PA-C    Allergies: Poison ivy extract [poison ivy extract]    Review of Systems  Updated Vital Signs BP (!) 184/96 (BP Location: Right Arm)   Pulse 63   Temp 98.6 F (37 C) (Oral)   Resp 17   Ht 5' 5 (1.651 m)   Wt 78.1 kg   SpO2 94%   BMI 28.66 kg/m   Physical Exam Vitals and nursing note reviewed.  Constitutional:      General: He is not in acute distress.    Appearance: He is well-developed.  HENT:     Head: Normocephalic and atraumatic.  Eyes:     Conjunctiva/sclera: Conjunctivae normal.  Cardiovascular:     Rate and Rhythm: Normal rate and regular rhythm.     Heart sounds: No murmur heard. Pulmonary:     Effort: Pulmonary effort is normal. No respiratory distress.     Breath sounds: Normal breath sounds.  Abdominal:     Palpations: Abdomen is soft.     Tenderness: There is no abdominal tenderness.  Musculoskeletal:        General: No swelling.     Cervical back: Neck supple.     Comments: Left ankle has moderate swelling over the medial malleolus with ecchymosis.  DP and  PT pulses intact, no tenderness over the fifth metatarsal head or foot otherwise.  No lateral ankle tenderness, no tenderness to the calf or knee.  No tenderness or defect over the Achilles.  Skin:    General: Skin is warm and dry.     Capillary Refill: Capillary refill takes less than 2 seconds.  Neurological:     General: No focal deficit present.     Mental Status: He is alert and oriented to person, place, and time.  Psychiatric:        Mood and Affect: Mood normal.     (all labs ordered are listed, but only abnormal results are displayed) Labs Reviewed - No data to display  EKG: None  Radiology: DG Ankle Complete Left Result Date: 06/28/2024 EXAM: 3 OR MORE VIEW(S) XRAY OF THE LEFT ANKLE 06/28/2024 05:36:00 PM CLINICAL HISTORY: fall COMPARISON: XR left tibia fibula 12/26/2015. FINDINGS: BONES AND JOINTS: Acute displaced  transverse fracture of the medial malleolus. Ankle joint effusion. No focal osseous lesion. No joint dislocation. SOFT TISSUES: Medial subcutaneous soft tissue edema. IMPRESSION: 1. Acute displaced transverse fracture of the medial malleolus. 2. Ankle joint effusion. Electronically signed by: Morgane Naveau MD 06/28/2024 05:46 PM EST RP Workstation: [id number]     .Splint Application  Date/Time: 06/28/2024 8:00 PM  Performed by: Suellen Sherran LABOR, PA-C Authorized by: Suellen Sherran LABOR, PA-C   Consent:    Consent obtained:  Verbal   Consent given by:  Patient   Risks discussed:  Discoloration, numbness, pain and swelling Universal protocol:    Imaging studies available: yes     Patient identity confirmed:  Verbally with patient Pre-procedure details:    Distal neurologic exam:  Normal   Distal perfusion: distal pulses strong   Procedure details:    Location:  Ankle   Ankle location:  L ankle   Splint type:  Short leg (Posterior and stirrup Ortho-Glass splint)   Supplies:  Cotton padding, fiberglass and elastic bandage   Splint applied and adjusted personally by me: Applied by ED tech, checked by me.   Post-procedure details:    Distal neurologic exam:  Normal   Distal perfusion: brisk capillary refill and unchanged     Procedure completion:  Tolerated well, no immediate complications    Medications Ordered in the ED  ibuprofen  (ADVIL ) tablet 600 mg (has no administration in time range)  acetaminophen  (TYLENOL ) tablet 650 mg (has no administration in time range)                                    Medical Decision Making Differential diagnosis includes but not limited to fracture, sprain, strain, contusion, dislocation, other ED course: Patient presents with left ankle pain and swelling after twisting his ankle 3 days ago.  He has been bearing weight but states the pain has worsened as has the swelling so he wanted x-rays.  X-rays do show a displaced fracture of the medial  malleolus of the left ankle with a joint effusion.  Patient did not have any other injuries he has no other tenderness.  Discussed with patient that he will need to be put in a splint and follow-up with orthopedics and maintain nonweightbearing status. Treat the pain with NSAIDs since he is on Suboxone . Patient became very anxious when I discussed with him that he had a fracture and will need to be nonweightbearing, as he is worried about losing his job,  he got progressively more upset and requested Ativan  for anxiety which was ordered.  Patient advised he can discuss his employer FMLA paperwork which can be filled out by orthopedics if he is eligible.  Initially very hypertensive but on recheck after analgesia and anxiolysis patient's blood pressure is back to normal.  Discussed that he should follow-up with his PCP for any ongoing anxiety.   Amount and/or Complexity of Data Reviewed External Data Reviewed: notes. Radiology: ordered and independent interpretation performed.    Details: Displaced transverse fracture left medial malleolus with ankle effusion.  Reviewed radiology read.  Risk OTC drugs. Prescription drug management.        Final diagnoses:  None    ED Discharge Orders     None          Suellen Sherran DELENA DEVONNA 06/28/24 RAYNOLD Patsey Lot, MD 06/28/24 2322

## 2024-06-28 NOTE — ED Triage Notes (Signed)
 Pt states tripped walking on Monday and twisted left ankle. Went to UC and stated he did not think it was broken and neither did the provider at [facility] so he did not get it an xray. Pain in ankle has gotten worse over the past few days and would like to get an xray to make sure it is not broken.

## 2024-06-28 NOTE — Discharge Instructions (Addendum)
 You were evaluated in the emergency room today for left ankle pain.  X-ray shows displaced fracture in your left ankle.  Will put in a splint and given crutches.  Do not put any weight on the ankle and follow-up closely with orthopedics.  You can take over-the-counter Tylenol  as needed for pain at the packaging and take the ibuprofen  as prescribed.  Keep the splint dry.  Call orthopedics tomorrow for follow-up.  Keep your foot elevated is much as possible.  No severe pain or color change to your toes or numbness or tingling in your foot that does not go away with repositioning or slightly loosening the Ace bandage come back to the ER right away.  Do not take the splint off.  You are also reporting some increased anxiety related to not being able to work, please follow-up with your primary doctor for this.  Please come back to the ER for new or worsening symptoms.

## 2024-06-29 ENCOUNTER — Emergency Department (HOSPITAL_COMMUNITY)
Admission: EM | Admit: 2024-06-29 | Discharge: 2024-06-29 | Disposition: A | Discharge status: Home / Self Care | Payer: MEDICAID | Attending: Emergency Medicine | Admitting: Emergency Medicine

## 2024-06-29 DIAGNOSIS — S8252XG Displaced fracture of medial malleolus of left tibia, subsequent encounter for closed fracture with delayed healing: Secondary | ICD-10-CM | POA: Insufficient documentation

## 2024-06-29 DIAGNOSIS — S8252XA Displaced fracture of medial malleolus of left tibia, initial encounter for closed fracture: Secondary | ICD-10-CM

## 2024-06-29 DIAGNOSIS — W19XXXA Unspecified fall, initial encounter: Secondary | ICD-10-CM | POA: Diagnosis not present

## 2024-06-29 MED ORDER — ACETAMINOPHEN 325 MG PO TABS
650.0000 mg | ORAL_TABLET | Freq: Once | ORAL | Status: AC
  Administered 2024-06-29: 650 mg via ORAL
  Filled 2024-06-29: qty 2

## 2024-06-29 MED ORDER — METHOCARBAMOL 750 MG PO TABS: 750.0000 mg | ORAL_TABLET | Freq: Three times a day (TID) | ORAL | 0 refills | Status: AC

## 2024-06-29 MED ORDER — IBUPROFEN 400 MG PO TABS
600.0000 mg | ORAL_TABLET | Freq: Once | ORAL | Status: AC
  Administered 2024-06-29: 600 mg via ORAL
  Filled 2024-06-29: qty 2

## 2024-06-29 NOTE — ED Provider Notes (Signed)
 [place] EMERGENCY DEPARTMENT AT [facility] Provider Note   CSN: [id number] Arrival date & time: 06/29/24  1601     Patient presents with: Leg Pain   Ryan Kelley is a 38 y.o. male.   Patient is a 38 year old male who presents to the emergency department with a chief complaint of left ankle pain.  Patient was seen in the emergency department yesterday and diagnosed with a left medial malleolus fracture.  He notes that last night while he was sleeping he felt numbness and tingling to his toes as well as worsening pain to his foot.  He notes that he did loosen the splint and notes that this has since resolved.  He notes that he just went to be evaluated in the emergency department again.   Leg Pain      Prior to Admission medications   Medication Sig Start Date End Date Taking? Authorizing Provider  Buprenorphine  HCl-Naloxone  HCl 8-2 MG FILM Place 1 Film under the tongue 2 (two) times daily. 06/16/24  Yes [provider]  methocarbamol  (ROBAXIN ) 750 MG tablet Take 1 tablet (750 mg total) by mouth 3 (three) times daily. 06/29/24  Yes Daralene Bruckner D, PA-C  sertraline (ZOLOFT) 100 MG tablet Take 100 mg by mouth daily. 06/14/24  Yes [provider]  busPIRone (BUSPAR) 10 MG tablet Take 10 mg by mouth 2 (two) times daily. 06/11/24   [provider]  busPIRone (BUSPAR) 15 MG tablet Take 15 mg by mouth 2 (two) times daily. 06/14/24   [provider]  FLUoxetine  (PROZAC ) 20 MG capsule Take 1 capsule (20 mg total) by mouth daily. 08/22/20 09/21/20  Hansel Comer RAMAN, MD  hydrOXYzine  (ATARAX ) 25 MG tablet Take 25 mg by mouth 2 (two) times daily as needed. 02/06/24   [provider]  ibuprofen  (ADVIL ) 600 MG tablet Take 1 tablet (600 mg total) by mouth every 6 (six) hours as needed. 06/28/24   Suellen Cantor A, PA-C  traZODone  (DESYREL ) 50 MG tablet Take 50 mg by mouth at bedtime.    [provider]  SUMAtriptan  (IMITREX ) 50  MG tablet May repeat in 2 hours if headache persists or recurs.  Do not exceed more than 4 tablets in 24 hours Patient not taking: Reported on 11/25/2015 03/13/15 12/26/15  Idol, Julie, PA-C    Allergies: Poison ivy extract [poison ivy extract]    Review of Systems  Musculoskeletal:        Pain to left ankle  All other systems reviewed and are negative.   Updated Vital Signs BP (!) 163/138   Pulse 69   Temp 98.9 F (37.2 C) (Oral)   Resp 18   Ht 5' 5 (1.651 m)   Wt 78 kg   SpO2 99%   BMI 28.62 kg/m   Physical Exam Vitals and nursing note reviewed.  Constitutional:      Appearance: Normal appearance.  HENT:     Head: Normocephalic and atraumatic.  Eyes:     Extraocular Movements: Extraocular movements intact.     Conjunctiva/sclera: Conjunctivae normal.     Pupils: Pupils are equal, round, and reactive to light.  Cardiovascular:     Rate and Rhythm: Normal rate and regular rhythm.     Pulses: Normal pulses.  Pulmonary:     Effort: Pulmonary effort is normal. No respiratory distress.  Musculoskeletal:        General: Normal range of motion.     Comments: Left ankle and lower extremity and  sugar-tong and posterior splint, DP and PT pulses are 2+ distally after splint was taken down, cap refill less than 2 seconds distally, no overlying erythema or warmth, no skin breakdown or ulceration  Skin:    General: Skin is warm and dry.  Neurological:     General: No focal deficit present.     Mental Status: He is alert and oriented to person, place, and time. Mental status is at baseline.  Psychiatric:        Mood and Affect: Mood normal.        Behavior: Behavior normal.        Thought Content: Thought content normal.        Judgment: Judgment normal.     (all labs ordered are listed, but only abnormal results are displayed) Labs Reviewed - No data to display  EKG: None  Radiology: DG Ankle Complete Left Result Date: 06/28/2024 EXAM: 3 OR MORE VIEW(S) XRAY OF THE  LEFT ANKLE 06/28/2024 05:36:00 PM CLINICAL HISTORY: fall COMPARISON: XR left tibia fibula 12/26/2015. FINDINGS: BONES AND JOINTS: Acute displaced transverse fracture of the medial malleolus. Ankle joint effusion. No focal osseous lesion. No joint dislocation. SOFT TISSUES: Medial subcutaneous soft tissue edema. IMPRESSION: 1. Acute displaced transverse fracture of the medial malleolus. 2. Ankle joint effusion. Electronically signed by: Morgane Naveau MD 06/28/2024 05:46 PM EST RP Workstation: [id number]     Procedures   Medications Ordered in the ED  acetaminophen  (TYLENOL ) tablet 650 mg (has no administration in time range)  ibuprofen  (ADVIL ) tablet 600 mg (has no administration in time range)                                    Medical Decision Making Patient is doing well at this time and is stable for discharge home.  Splint was rewrapped in the emergency department.  Patient has had complete resolution of his symptoms at this point.  Will provide muscle relaxer to help with his discomfort as well as the NSAIDs he is already taking.  Patient has no other secondary complaints at this time.  He has intact peripheral pulses and intact sensation.  No indication for compartment syndrome at this time.  Patient already has follow-up scheduled with orthopedics.  Risk OTC drugs. Prescription drug management.        Final diagnoses:  Closed displaced fracture of medial malleolus of left tibia, initial encounter    ED Discharge Orders          Ordered    methocarbamol  (ROBAXIN ) 750 MG tablet  3 times daily        06/29/24 1638               Makina Skow D, PA-C 06/29/24 1640    Patsey Lot, MD 06/29/24 2259

## 2024-06-29 NOTE — ED Triage Notes (Signed)
 Patient arrives POV from home. Ryan Kelley, came to ED yesterday. Dx with LLE fracture and splinted. Patient states this morning woke up with numb toes and pain in his foot/ankle. Concerned that his splint is too tight. L DP pulse is +1 R DP pulse is +2. Bilateral feet are cool. Motor and sensation intact and symmetrical bilaterally.

## 2024-06-29 NOTE — Discharge Instructions (Signed)
 Please follow-up closely with orthopedics on an outpatient basis.  Return to emergency department immediately for any new or worsening symptoms.

## 2024-07-09 ENCOUNTER — Ambulatory Visit: Payer: MEDICAID | Admitting: Orthopedic Surgery

## 2024-07-09 ENCOUNTER — Encounter: Payer: Self-pay | Admitting: Orthopedic Surgery

## 2024-07-09 ENCOUNTER — Other Ambulatory Visit: Payer: MEDICAID

## 2024-07-09 VITALS — BP 152/90 | Ht 65.0 in | Wt 171.0 lb

## 2024-07-09 DIAGNOSIS — S8255XA Nondisplaced fracture of medial malleolus of left tibia, initial encounter for closed fracture: Secondary | ICD-10-CM | POA: Diagnosis not present

## 2024-07-09 DIAGNOSIS — M25572 Pain in left ankle and joints of left foot: Secondary | ICD-10-CM

## 2024-07-09 NOTE — Progress Notes (Signed)
 New patient ER follow-up   Patient: Ryan Kelley           Date of Birth: 1986-07-24           MRN: [id number] Visit Date: 07/09/2024 Requested by: Sheryle Carwin, MD [address] [place],  [place] [address] PCP: Sheryle Carwin, MD   Assessment & Plan:   [phone] CPT code  Encounter Diagnosis  Name Primary?   Pain in left ankle and joints of left foot Yes    No orders of the defined types were placed in this encounter.   Minimally displaced medial malleolus fracture left ankle  This should not require surgery  It has been stable for 2 weeks even after the patient did weight-bear for several days prior to being seen by the emergency room  Cam walking boot  800 mg ibuprofen  OTC 3 times a day  Discuss opioid management with pain management service   Subjective: Chief Complaint  Patient presents with   Ankle Pain    Left 06/25/24     HPI: 38 year old male slipped and fell injured his left ankle.  He worked a full shift continue to have pain and swelling went to urgent care they thought it was a sprain.  However due to continued pain the patient eventually went to the emergency room and x-rays showed a nondisplaced medial malleolus fracture he has been nonweightbearing with crutches  He is on Suboxone   He is discussing his opioid management with his pain management service              ROS: Tingly feeling right foot   Images personally read and my interpretation : Imaging studies at the hospital dated June 28, 2024 shows a medial malleolus fracture intact ankle mortise minimally displaced  Visit Diagnoses:  1. Pain in left ankle and joints of left foot      Follow-Up Instructions: No follow-ups on file.    Objective: Vital Signs: BP (!) 152/90 Comment: 06/29/24  Ht 5' 5 (1.651 m)   Wt 171 lb (77.6 kg)   BMI 28.46 kg/m   Physical Exam Vitals and nursing note reviewed.  Constitutional:      Appearance: Normal appearance.  HENT:     Head:  Normocephalic and atraumatic.  Eyes:     General: No scleral icterus.       Right eye: No discharge.        Left eye: No discharge.     Extraocular Movements: Extraocular movements intact.     Conjunctiva/sclera: Conjunctivae normal.     Pupils: Pupils are equal, round, and reactive to light.  Cardiovascular:     Rate and Rhythm: Normal rate.     Pulses: Normal pulses.  Skin:    General: Skin is warm and dry.     Capillary Refill: Capillary refill takes less than 2 seconds.  Neurological:     General: No focal deficit present.     Mental Status: He is alert and oriented to person, place, and time.  Psychiatric:        Mood and Affect: Mood normal.        Behavior: Behavior normal.        Thought Content: Thought content normal.        Judgment: Judgment normal.      Ortho Exam  Focused exam left ankle.  There is bruising swelling and ecchymosis surrounding the left ankle primarily on the medial side  He is tender over the bone and  also tender on the lateral side over the lateral collateral ligaments  The foot is plantigrade minimal range of motion secondary to pain stability tests were deferred because of the pain and swelling  Soft touch sensation is intact pain response is intact  He can move his toes and slight dorsiflexion plantarflexion of the ankle   Specialty Comments:  No specialty comments available.  Imaging: DG Ankle Complete Left Result Date: 07/09/2024 Image report Left ankle Medial talus fracture Fracture follow-up initial injury November 20 (11 days since injury) X-ray shows intact ankle mortise transverse fracture medial malleolus with minimal displacement Minimally displaced medial ankle fracture     PMFS History: Patient Active Problem List   Diagnosis Date Noted   MDD (major depressive disorder), recurrent episode 10/25/2020   Suicidal ideation    Malingering 08/25/2020   Polysubstance abuse (HCC) 08/21/2020   Severe benzodiazepine use  disorder (HCC) 05/27/2020   Stimulant abuse (HCC) 05/27/2020   Opioid dependence, uncomplicated (HCC) 12/31/2019   Major depressive disorder, recurrent episode 12/31/2019   Opioid withdrawal (HCC) 07/08/2016   Substance induced mood disorder (HCC) 12/05/2013   Opioid use disorder, severe, dependence (HCC) 10/18/2012   Heroin abuse (HCC) 10/18/2012   Past Medical History:  Diagnosis Date   Anxiety    Attempted suicide (HCC)    Carpal tunnel syndrome    Chronic back pain    Chronic knee pain    Depression    Insomnia    PTSD (post-traumatic stress disorder)    Substance abuse (HCC)     Family History  Problem Relation Age of Onset   Diabetes Mother    Cancer Mother    Hypertension Mother    Hypertension Father     History reviewed. No pertinent surgical history. Social History   Occupational History   Not on file  Tobacco Use   Smoking status: Every Day    Current packs/day: 1.00    Average packs/day: 1 pack/day for 7.0 years (7.0 ttl pk-yrs)    Types: Cigarettes   Smokeless tobacco: Never  Substance and Sexual Activity   Alcohol use: Yes    Comment: occasionally    Drug use: Yes    Frequency: 5.0 times per week    Types: Hydrocodone , Oxycodone, Benzodiazepines, Marijuana    Comment: heroin last used 08/23/20   Sexual activity: Not on file

## 2024-07-09 NOTE — Patient Instructions (Signed)
 Out of work note for 6 weeks  Weight-bear as tolerated in the cam walker with crutches  Patient has a medial malleolus fracture which will take 6 to 8 weeks to heal  Take 800 mg ibuprofen  every 8 hours  Discuss opioid management with pain management physician

## 2024-07-09 NOTE — Progress Notes (Signed)
  Intake history:  Chief Complaint  Patient presents with   Ankle Pain    Left 06/25/24      BP (!) 152/90 Comment: 06/29/24  Ht 5' 5 (1.651 m)   Wt 171 lb (77.6 kg)   BMI 28.46 kg/m  Body mass index is 28.46 kg/m.  Pharmacy? __[facility] ____________________________________  WHAT ARE WE SEEING YOU FOR TODAY?   Ankle   How long has this bothered you? (DOI?DOS?WS?)  on 06/25/24  Was there an injury? Yes  Anticoag.  No   Any ALLERGIES ________ Allergies  Allergen Reactions   Poison Ivy Extract [Poison Ivy Extract] Other (See Comments)    Unknown    ______________________________________   Treatment:  Have you taken:  Tylenol  yes  Advil  yes  Had PT No  Had injection No  Other  ___________Splint ER______________

## 2024-07-10 ENCOUNTER — Telehealth: Payer: Self-pay

## 2024-07-10 NOTE — Telephone Encounter (Signed)
 Patient left message asking if he needs to keep the boot on while he is sleeping or can he take it off to sleep.  He is asking for a return call at [phone]

## 2024-07-11 NOTE — Telephone Encounter (Signed)
 I called to advise I forgot to tell him he can take off at night

## 2024-07-16 ENCOUNTER — Telehealth: Payer: Self-pay | Admitting: Orthopedic Surgery

## 2024-07-16 NOTE — Telephone Encounter (Signed)
 Called the pt and advised, he verbalized understanding

## 2024-07-16 NOTE — Telephone Encounter (Signed)
 Dr. Areatha pt - pt lvm 07/15/24 at 11:46am stating that he didn't see a breakdown on his AVS about the crutches.  He doesn't remember how long to use the crutches primarily, how long to use w/the boot and when do use the boot w/out crutches.  [phone]

## 2024-08-06 ENCOUNTER — Other Ambulatory Visit (INDEPENDENT_AMBULATORY_CARE_PROVIDER_SITE_OTHER): Payer: MEDICAID

## 2024-08-06 ENCOUNTER — Ambulatory Visit (INDEPENDENT_AMBULATORY_CARE_PROVIDER_SITE_OTHER): Payer: MEDICAID | Admitting: Orthopedic Surgery

## 2024-08-06 DIAGNOSIS — S82892D Other fracture of left lower leg, subsequent encounter for closed fracture with routine healing: Secondary | ICD-10-CM

## 2024-08-06 NOTE — Progress Notes (Signed)
" ° ° °  08/06/2024   Chief Complaint  Patient presents with   Ankle Injury    Encounter Diagnosis  Name Primary?   Closed fracture of left ankle with routine healing, subsequent encounter 06/25/24 Yes    What pharmacy do you use ? _______[facility] ____________________  DOI/DOS/ Date: 06/25/24  Did you get better, worse or no change (Answer below)   6-week follow-up medial malleolus fracture left ankle treated with cam walker protected weightbearing patient doing well.  Patient went back to work with his walking boot and 1 crutch  Exam shows no tenderness or swelling over the medial malleolus.  Ankle range of motion looks normal   DG Ankle Complete Left Result Date: 08/06/2024 X-ray left ankle medial malleolus fracture. Medial malleolar fracture appears to be healing well no disruption of the ankle mortise Date of injury was November 20 x-rays reviewed for comparison Healing fracture medial malleolus at 5-1/2 weeks     Recommend 2 weeks in the boot crutches can be removed  After 2 weeks patient can return to normal shoewear  Patient released        "

## 2024-08-06 NOTE — Progress Notes (Signed)
" ° ° °  08/06/2024   Chief Complaint  Patient presents with   Ankle Injury    Encounter Diagnosis  Name Primary?   Closed fracture of left ankle with routine healing, subsequent encounter 06/25/24 Yes    What pharmacy do you use ? _____[facility] ____________________  DOI/DOS/ Date: 06/25/24  Did you get better, worse or no change (Answer below)   Unchanged      "

## 2024-08-13 ENCOUNTER — Telehealth: Payer: Self-pay | Admitting: Orthopedic Surgery

## 2024-08-13 NOTE — Telephone Encounter (Signed)
 Dr. Areatha pt - pt lvm asking when he takes the boot off, are there any exercises can do to speed up the recovery and how long should he take it easy and do light duty at work.  [phone]

## 2024-08-13 NOTE — Telephone Encounter (Signed)
 Called the pt and advised, he verbalized understanding and stated he does not need a note.
